# Patient Record
Sex: Male | Born: 1965 | Race: White | Hispanic: No | Marital: Married | Smoking: Never smoker
Health system: Southern US, Community
[De-identification: ages and names within clinical notes are randomized; demographics above are authoritative.]

## PROBLEM LIST (undated history)

## (undated) DIAGNOSIS — J45909 Unspecified asthma, uncomplicated: Secondary | ICD-10-CM

## (undated) DIAGNOSIS — R519 Headache, unspecified: Secondary | ICD-10-CM

## (undated) DIAGNOSIS — I10 Essential (primary) hypertension: Secondary | ICD-10-CM

## (undated) DIAGNOSIS — R51 Headache: Secondary | ICD-10-CM

## (undated) DIAGNOSIS — K5190 Ulcerative colitis, unspecified, without complications: Secondary | ICD-10-CM

## (undated) DIAGNOSIS — N200 Calculus of kidney: Secondary | ICD-10-CM

## (undated) DIAGNOSIS — Z87442 Personal history of urinary calculi: Secondary | ICD-10-CM

## (undated) DIAGNOSIS — N189 Chronic kidney disease, unspecified: Secondary | ICD-10-CM

## (undated) DIAGNOSIS — E669 Obesity, unspecified: Secondary | ICD-10-CM

## (undated) HISTORY — PX: HERNIA REPAIR: SHX51

## (undated) HISTORY — DX: Unspecified asthma, uncomplicated: J45.909

## (undated) HISTORY — PX: COLECTOMY: SHX59

---

## 2012-08-12 ENCOUNTER — Ambulatory Visit: Payer: Self-pay | Admitting: Urology

## 2013-12-16 DIAGNOSIS — I10 Essential (primary) hypertension: Secondary | ICD-10-CM | POA: Insufficient documentation

## 2013-12-16 DIAGNOSIS — E785 Hyperlipidemia, unspecified: Secondary | ICD-10-CM | POA: Insufficient documentation

## 2013-12-16 DIAGNOSIS — J309 Allergic rhinitis, unspecified: Secondary | ICD-10-CM | POA: Insufficient documentation

## 2014-06-16 DIAGNOSIS — D352 Benign neoplasm of pituitary gland: Secondary | ICD-10-CM | POA: Insufficient documentation

## 2014-06-28 ENCOUNTER — Ambulatory Visit: Payer: Self-pay | Admitting: Family Medicine

## 2014-09-16 ENCOUNTER — Ambulatory Visit: Payer: BLUE CROSS/BLUE SHIELD | Admitting: Anesthesiology

## 2014-09-16 ENCOUNTER — Encounter
Admission: RE | Disposition: A | Discharge status: Home / Self Care | Payer: Self-pay | Source: Ambulatory Visit | Attending: Gastroenterology

## 2014-09-16 ENCOUNTER — Encounter: Payer: Self-pay | Admitting: *Deleted

## 2014-09-16 ENCOUNTER — Ambulatory Visit
Admission: RE | Admit: 2014-09-16 | Discharge: 2014-09-16 | Disposition: A | Discharge status: Home / Self Care | Payer: BLUE CROSS/BLUE SHIELD | Source: Ambulatory Visit | Attending: Gastroenterology | Admitting: Gastroenterology

## 2014-09-16 DIAGNOSIS — J45909 Unspecified asthma, uncomplicated: Secondary | ICD-10-CM | POA: Insufficient documentation

## 2014-09-16 DIAGNOSIS — Z8719 Personal history of other diseases of the digestive system: Secondary | ICD-10-CM | POA: Insufficient documentation

## 2014-09-16 DIAGNOSIS — Z1211 Encounter for screening for malignant neoplasm of colon: Secondary | ICD-10-CM | POA: Diagnosis not present

## 2014-09-16 DIAGNOSIS — I129 Hypertensive chronic kidney disease with stage 1 through stage 4 chronic kidney disease, or unspecified chronic kidney disease: Secondary | ICD-10-CM | POA: Insufficient documentation

## 2014-09-16 DIAGNOSIS — Z79899 Other long term (current) drug therapy: Secondary | ICD-10-CM | POA: Diagnosis not present

## 2014-09-16 DIAGNOSIS — K91850 Pouchitis: Secondary | ICD-10-CM | POA: Insufficient documentation

## 2014-09-16 DIAGNOSIS — N189 Chronic kidney disease, unspecified: Secondary | ICD-10-CM | POA: Diagnosis not present

## 2014-09-16 DIAGNOSIS — Z86010 Personal history of colonic polyps: Secondary | ICD-10-CM | POA: Insufficient documentation

## 2014-09-16 DIAGNOSIS — E669 Obesity, unspecified: Secondary | ICD-10-CM | POA: Diagnosis not present

## 2014-09-16 DIAGNOSIS — Z6834 Body mass index (BMI) 34.0-34.9, adult: Secondary | ICD-10-CM | POA: Insufficient documentation

## 2014-09-16 DIAGNOSIS — R51 Headache: Secondary | ICD-10-CM | POA: Diagnosis not present

## 2014-09-16 DIAGNOSIS — Z980 Intestinal bypass and anastomosis status: Secondary | ICD-10-CM | POA: Insufficient documentation

## 2014-09-16 HISTORY — PX: COLONOSCOPY WITH PROPOFOL: SHX5780

## 2014-09-16 HISTORY — DX: Essential (primary) hypertension: I10

## 2014-09-16 HISTORY — DX: Headache, unspecified: R51.9

## 2014-09-16 HISTORY — DX: Obesity, unspecified: E66.9

## 2014-09-16 HISTORY — DX: Calculus of kidney: N20.0

## 2014-09-16 HISTORY — DX: Ulcerative colitis, unspecified, without complications: K51.90

## 2014-09-16 HISTORY — DX: Headache: R51

## 2014-09-16 HISTORY — DX: Chronic kidney disease, unspecified: N18.9

## 2014-09-16 SURGERY — COLONOSCOPY WITH PROPOFOL
Anesthesia: General

## 2014-09-16 MED ORDER — MIDAZOLAM HCL 5 MG/5ML IJ SOLN
INTRAMUSCULAR | Status: DC | PRN
  Administered 2014-09-16: 2 mg via INTRAVENOUS
  Administered 2014-09-16: 1 mg via INTRAVENOUS

## 2014-09-16 MED ORDER — MIDAZOLAM HCL 5 MG/5ML IJ SOLN
INTRAMUSCULAR | Status: AC
  Filled 2014-09-16: qty 5

## 2014-09-16 MED ORDER — FENTANYL CITRATE (PF) 100 MCG/2ML IJ SOLN
INTRAMUSCULAR | Status: DC | PRN
  Administered 2014-09-16: 25 ug via INTRAVENOUS

## 2014-09-16 MED ORDER — SODIUM CHLORIDE 0.9 % IV SOLN
INTRAVENOUS | Status: DC
Start: 2014-09-16 — End: 2014-09-16
  Administered 2014-09-16: 11:00:00 via INTRAVENOUS

## 2014-09-16 MED ORDER — FENTANYL CITRATE (PF) 100 MCG/2ML IJ SOLN
INTRAMUSCULAR | Status: AC
  Filled 2014-09-16: qty 2

## 2014-09-16 NOTE — Op Note (Addendum)
[facility] Gastroenterology Patient Name: Joel Terry Procedure Date: 09/16/2014 12:10 PM MRN: [id number] Account #: [id number] Date of Birth: [birth year] Admit Type: Outpatient Age: 49 Room: [facility] ENDO ROOM 3 Gender: Male Note Status: Supervisor Override Procedure:         Flexible Sigmoidoscopy Indications:       Personal history of colonic polyps, Personal history of                     ulcerative colitis Providers:         Lollie Sails, MD Referring MD:      Caprice Renshaw (Referring MD) Medicines:         Fentanyl 25 micrograms IV, Midazolam 3 mg IV Complications:     No immediate complications. Patient comfortable throughout                     proceedure. Procedure:         Pre-Anesthesia Assessment:                    - ASA Grade Assessment: II - A patient with mild systemic                     disease.                    After obtaining informed consent, the scope was passed                     under direct vision. The Olympus PCF-H180AL colonoscope (                     S#: [id number] ) was introduced through the anus and advanced                     to the the ileo-rectal anastomosis. After obtaining                     informed consent, the scope was passed under direct                     vision. The flexible sigmoidoscopy was accomplished                     without difficulty. The patient tolerated the procedure                     well. The quality of the bowel preparation was good. Findings:      The scope was passed through the rectum into a pouch-like structure. The       top/most proximal portion of the pouch was at about 15 cm from the       residual dentate line. There is an opening at about 10 cm from the       dentate line through which watery stool residue passed. There were       several small ulcers noted distal to the opening with some erythema in       the distal portion of the "pouch". This opening was about 8 -10 mm and   was not passed. Biopsies were also taken from this area as well, and       about 5 cm from the dentate demarcation and placed in a separate jar.       The mucosa of the upper  pouch was smooth. There was no evidence of       polypoid lesion noted throughout.      pouchitis      The digital rectal exam was normal.      The perianal examination was normal. Impression:        ileal pouch noted with stenotic opening to the ileum, mild                     pouchitis.                    - mild pouchitis Recommendation:    - Await pathology results. Procedure Code(s): --- Professional ---                    [phone], Sigmoidoscopy, flexible; diagnostic, including                     collection of specimen(s) by brushing or washing, when                     performed (separate procedure) CPT copyright 2014 American Medical Association. All rights reserved. The codes documented in this report are preliminary and upon coder review may  be revised to meet current compliance requirements. Lollie Sails, MD 09/16/2014 1:36:50 PM This report has been signed electronically. Number of Addenda: 0 Note Initiated On: 09/16/2014 12:10 PM      [facility]

## 2014-09-16 NOTE — Anesthesia Preprocedure Evaluation (Deleted)
Anesthesia Evaluation  Patient identified by MRN, date of birth, ID band Patient awake    Reviewed: Allergy & Precautions, NPO status , Patient's Chart, lab work & pertinent test results  History of Anesthesia Complications Negative for: history of anesthetic complications  Airway Mallampati: II       Dental no notable dental hx.    Pulmonary asthma ,    Pulmonary exam normal       Cardiovascular hypertension, Pt. on medications Normal cardiovascular exam    Neuro/Psych  Headaches, negative psych ROS   GI/Hepatic PUD,   Endo/Other  negative endocrine ROS  Renal/GU Renal disease  negative genitourinary   Musculoskeletal negative musculoskeletal ROS (+)   Abdominal (+) + obese,   Peds negative pediatric ROS (+)  Hematology negative hematology ROS (+)   Anesthesia Other Findings   Reproductive/Obstetrics negative OB ROS                             Anesthesia Physical Anesthesia Plan  ASA: III  Anesthesia Plan: General   Post-op Pain Management:    Induction: Intravenous  Airway Management Planned: Nasal Cannula  Additional Equipment:   Intra-op Plan:   Post-operative Plan:   Informed Consent: I have reviewed the patients History and Physical, chart, labs and discussed the procedure including the risks, benefits and alternatives for the proposed anesthesia with the patient or authorized representative who has indicated his/her understanding and acceptance.     Plan Discussed with: CRNA  Anesthesia Plan Comments:         Anesthesia Quick Evaluation

## 2014-09-16 NOTE — H&P (Signed)
Outpatient short stay form Pre-procedure 09/16/2014 12:10 PM Lollie Sails MD  Primary Physician: Dr. Derinda Late  Reason for visit:  History of ulcerative colitis  History of present illness:  Patient has extensive history of ulcerative colitis. This was diagnosed approximately a year 2000 and has been treated with several agents. Patently not successful with treatment ended having a total proctocolectomy with ileal pouch anastomosis, diverting loop ileostomy with reversal about 6 months later Dr. Hester Mates at [facility]. He does not take any maintenance medication. At times have some mucus in the stool.  Patient states that 4 years ago he had a colonoscopic type procedure being done by Dr. Lulu Riding at [facility] in [place]. He was told at that time that he needed to have a procedure yearly.        Current facility-administered medications:  .  0.9 %  sodium chloride infusion, , Intravenous, Continuous, Lollie Sails, MD, Last Rate: 20 mL/hr at 09/16/14 1126  Prescriptions prior to admission  Medication Sig Dispense Refill Last Dose  . lisinopril (PRINIVIL,ZESTRIL) 20 MG tablet Take 1 tablet by mouth daily.  3   . simvastatin (ZOCOR) 10 MG tablet Take 1 tablet by mouth every evening.  5      Allergies  Allergen Reactions  . Sulfur Rash and Other (See Comments)    Elevated BP and heart rate     Past Medical History  Diagnosis Date  . Hypertension   . Obesity   . Chronic kidney disease   . Nephrolithiasis   . Headache   . Ulcerative colitis     Review of systems:      Physical Exam    Heart and lungs: Regular rate and rhythm without rub or gallop lungs are bilaterally clear    HEENT: Normocephalic atraumatic eyes are anicteric    Other:     Pertinant exam for procedure: Soft nontender nondistended bowel sounds positive normoactive    Planned proceedures: Flexible sigmoidoscopy with conscious sedation. I have discussed the risks benefits  and complications of procedures to include not limited to bleeding, infection, perforation and the risk of sedation and the patient wishes to proceed.    Lollie Sails, MD Gastroenterology 09/16/2014  12:10 PM

## 2014-09-20 LAB — SURGICAL PATHOLOGY

## 2014-09-21 ENCOUNTER — Encounter: Payer: Self-pay | Admitting: Gastroenterology

## 2015-01-13 ENCOUNTER — Ambulatory Visit (INDEPENDENT_AMBULATORY_CARE_PROVIDER_SITE_OTHER): Payer: BLUE CROSS/BLUE SHIELD | Admitting: Physician Assistant

## 2015-01-13 ENCOUNTER — Encounter: Payer: Self-pay | Admitting: Physician Assistant

## 2015-01-13 VITALS — BP 120/80 | HR 110 | Temp 98.0°F | Resp 16 | Ht 68.0 in | Wt 233.0 lb

## 2015-01-13 DIAGNOSIS — K5190 Ulcerative colitis, unspecified, without complications: Secondary | ICD-10-CM | POA: Diagnosis not present

## 2015-01-13 DIAGNOSIS — I10 Essential (primary) hypertension: Secondary | ICD-10-CM | POA: Diagnosis not present

## 2015-01-13 DIAGNOSIS — Z7189 Other specified counseling: Secondary | ICD-10-CM

## 2015-01-13 DIAGNOSIS — Z23 Encounter for immunization: Secondary | ICD-10-CM | POA: Diagnosis not present

## 2015-01-13 DIAGNOSIS — Z7689 Persons encountering health services in other specified circumstances: Secondary | ICD-10-CM

## 2015-01-13 DIAGNOSIS — G43909 Migraine, unspecified, not intractable, without status migrainosus: Secondary | ICD-10-CM | POA: Insufficient documentation

## 2015-01-13 DIAGNOSIS — E785 Hyperlipidemia, unspecified: Secondary | ICD-10-CM

## 2015-01-13 DIAGNOSIS — G43119 Migraine with aura, intractable, without status migrainosus: Secondary | ICD-10-CM | POA: Diagnosis not present

## 2015-01-13 MED ORDER — SIMVASTATIN 10 MG PO TABS: 10.0000 mg | ORAL_TABLET | Freq: Every evening | ORAL | Status: DC

## 2015-01-13 MED ORDER — LISINOPRIL 20 MG PO TABS
20.0000 mg | ORAL_TABLET | Freq: Every day | ORAL | Status: DC
Start: 2015-01-13 — End: 2015-07-14

## 2015-01-13 NOTE — Patient Instructions (Signed)

## 2015-01-13 NOTE — Progress Notes (Signed)
Patient: Joel Terry, Male    DOB: 05/06/1965, 49 y.o.   MRN: [id number] Visit Date: 01/13/2015  Today's Joel Terry: Joel Daring, PA-C   Chief Complaint  Patient presents with  . Establish Care   Subjective:    Annual physical exam Joel Terry is a 49 y.o. male who presents today for health maintenance and complete physical. He feels fairly well. He reports exercising, walks 2 miles everyday. He reports he is sleeping poorly. He does have a history of ulcerative colitis and underwent and ileectomy in 2000-06-21 for this. He most recently saw Dr. Gustavo Terry in June 2016 with follow-up colonoscopy which was normal. There was some acute inflammation in the biopsies taken and he was placed on an antibiotic for that. He has done well since his surgery and his ulcerative colitis has been controlled since that. He also does have history of migraines. He states that they are not debilitating. He occasionally will have an aura with his headaches. Most often he can control his headaches with rest and Tylenol. He also has high blood pressure and is currently well controlled on lisinopril. He also has a history of high cholesterol and states his cholesterol levels last time were stable and controlled on simvastatin. He is due to have his labs checked with a biometric screening at his employer in 2 weeks. He also states that he has a pituitary adenoma that has been followed for many years with MRI. He stated that he would go once every 3 or 4 years to have the size of the adenoma checked. His most recent MRI was either at the end of 06/21/13 or early 06-21-2014. He was not told the actual MRI results but was told that he did not have to have another MRI. This MRI was done at [facility]. We will try to get that result.  He does have a strong positive family history for cardiovascular disease in his father, brothers, uncles and grandfather. His father did have cardiovascular disease and underwent a bypass surgery in  the 57s. He passed away in 2004/06/21 due to cardiovascular complications. His mother also had ulcerative colitis. She passed away from a blood clot that occurred following a hernia repair surgery. He does have an older brother and an older sister that both seem to be fairly healthy and without complication.     Review of Systems  HENT: Positive for rhinorrhea and sinus pressure.   Musculoskeletal: Positive for neck stiffness.  Neurological: Positive for headaches.  All other systems reviewed and are negative.   Social History He  reports that he has never smoked. He does not have any smokeless tobacco history on file. He reports that he drinks alcohol. He reports that he does not use illicit drugs. Social History   Social History  . Marital Status: Married    Spouse Name: N/A  . Number of Children: N/A  . Years of Education: N/A   Social History Main Topics  . Smoking status: Never Smoker   . Smokeless tobacco: None  . Alcohol Use: Yes     Comment: Occassional  . Drug Use: No  . Sexual Activity: Not Asked   Other Topics Concern  . None   Social History Narrative    Patient Active Problem List   Diagnosis Date Noted  . Adenoma of pituitary 06/16/2014  . Allergic rhinitis 12/16/2013  . Benign essential HTN 12/16/2013  . HLD (hyperlipidemia) 12/16/2013    Past Surgical History  Procedure Laterality Date  . Colectomy    . Hernia repair    . Colonoscopy with propofol N/A 09/16/2014    Procedure: COLONOSCOPY WITH PROPOFOL;  Surgeon: Joel Sails, MD;  Location: [facility] ENDOSCOPY;  Service: Endoscopy;  Laterality: N/A;    Family History  Family Status  Relation Status Death Age  . Mother Deceased   . Father Deceased   . Son Alive    His family history is not on file.    Allergies  Allergen Reactions  . Sulfur Rash and Other (See Comments)    Elevated BP and heart rate    Previous Medications   LISINOPRIL (PRINIVIL,ZESTRIL) 20 MG TABLET    Take 1 tablet by mouth  daily.   SIMVASTATIN (ZOCOR) 10 MG TABLET    Take 1 tablet by mouth every evening.    Patient Care Team: Joel Daring, PA-C as PCP - General (Physician Assistant)     Objective:   Vitals: BP 120/80 mmHg  Pulse 110  Temp(Src) 98 F (36.7 C) (Oral)  Resp 16  Ht 5' 8"  (1.727 m)  Wt 233 lb (105.688 kg)  BMI 35.44 kg/m2   Physical Exam  Constitutional: He appears well-developed and well-nourished. No distress.  Cardiovascular: Normal rate, regular rhythm and normal heart sounds.  Exam reveals no gallop and no friction rub.   No murmur heard. Pulmonary/Chest: Effort normal and breath sounds normal. No respiratory distress. He has no wheezes. He has no rales.  Neurological: He is alert.  Skin: He is not diaphoretic.  Psychiatric: He has a normal mood and affect. His behavior is normal. Judgment and thought content normal.  Vitals reviewed.    Depression Screen No flowsheet data found.    Assessment & Plan:     Routine Health Maintenance and Physical Exam   1. Establishing care with new doctor, encounter for He gets labs done in 2 weeks with biometric screening for his employment. He will bring these results are faxed the nerve to the office. I will call him with any recommendations I have once I see these results. I will have him follow-up in 6 months for his annual physical exam.  2. Need for influenza vaccination Flu vaccine given today. Tolerated well and no complications. - Flu Vaccine QUAD 36+ mos IM  3. Intractable migraine with aura without status migrainosus Stable. He states they're not debilitating. He is able to control them with rest and Tylenol when he feels them coming on.  4. Ulcerative colitis, without complications Status post ileectomy. Most recent colonoscopy done by Dr. Gustavo Terry in June 2016. There was some acute inflammation noted in the biopsies taken. Dr. Gustavo Terry placed him on antibiotic for this. He is to follow-up with Dr. Gustavo Terry in  November of this year.  5. Essential hypertension Stable and currently well controlled on lisinopril. Labs will be checked in 2 weeks. Prescription for lisinopril was printed today for him to mail into his new insurance company prefers a 90 day supply. We will follow-up pending lab results or in 6 months if labs are stable. - lisinopril (PRINIVIL,ZESTRIL) 20 MG tablet; Take 1 tablet (20 mg total) by mouth daily.  Dispense: 90 tablet; Refill: 3  6. Hyperlipidemia Reported to be stable on simvastatin. Labs will be checked in 2 weeks. Prescription for simvastatin was printed today for him to mail into his new insurance company as they prefer a 90 day supply. We'll follow-up pending lab results or in 6 months at annual physical if  labs are stable. - simvastatin (ZOCOR) 10 MG tablet; Take 1 tablet (10 mg total) by mouth every evening.  Dispense: 90 tablet; Refill: 3  Exercise Activities and Dietary recommendations Goals    None       There is no immunization history on file for this patient.  Health Maintenance  Topic Date Due  . HIV Screening  11/05/1982  . TETANUS/TDAP  11/05/1986  . INFLUENZA VACCINE  11/14/2014      Discussed health benefits of physical activity, and encouraged him to engage in regular exercise appropriate for his age and condition.    --------------------------------------------------------------------

## 2015-04-26 DIAGNOSIS — N529 Male erectile dysfunction, unspecified: Secondary | ICD-10-CM | POA: Insufficient documentation

## 2015-04-26 DIAGNOSIS — N401 Enlarged prostate with lower urinary tract symptoms: Secondary | ICD-10-CM

## 2015-04-26 DIAGNOSIS — Z87442 Personal history of urinary calculi: Secondary | ICD-10-CM | POA: Insufficient documentation

## 2015-04-26 DIAGNOSIS — N138 Other obstructive and reflux uropathy: Secondary | ICD-10-CM | POA: Insufficient documentation

## 2015-07-14 ENCOUNTER — Encounter: Payer: Self-pay | Admitting: Physician Assistant

## 2015-07-14 ENCOUNTER — Ambulatory Visit (INDEPENDENT_AMBULATORY_CARE_PROVIDER_SITE_OTHER): Payer: BLUE CROSS/BLUE SHIELD | Admitting: Physician Assistant

## 2015-07-14 VITALS — BP 130/80 | HR 79 | Temp 98.0°F | Resp 16 | Ht 68.0 in | Wt 232.2 lb

## 2015-07-14 DIAGNOSIS — G43109 Migraine with aura, not intractable, without status migrainosus: Secondary | ICD-10-CM | POA: Diagnosis not present

## 2015-07-14 DIAGNOSIS — E785 Hyperlipidemia, unspecified: Secondary | ICD-10-CM | POA: Diagnosis not present

## 2015-07-14 DIAGNOSIS — I10 Essential (primary) hypertension: Secondary | ICD-10-CM | POA: Diagnosis not present

## 2015-07-14 DIAGNOSIS — Z0000 Encounter for general adult medical examination without abnormal findings: Secondary | ICD-10-CM

## 2015-07-14 DIAGNOSIS — Z833 Family history of diabetes mellitus: Secondary | ICD-10-CM

## 2015-07-14 DIAGNOSIS — Z125 Encounter for screening for malignant neoplasm of prostate: Secondary | ICD-10-CM

## 2015-07-14 MED ORDER — SIMVASTATIN 10 MG PO TABS: 10.0000 mg | ORAL_TABLET | Freq: Every evening | ORAL | Status: DC

## 2015-07-14 MED ORDER — SUMATRIPTAN SUCCINATE 100 MG PO TABS: 100.0000 mg | ORAL_TABLET | ORAL | Status: DC | PRN

## 2015-07-14 MED ORDER — LISINOPRIL 20 MG PO TABS: 20.0000 mg | ORAL_TABLET | Freq: Every day | ORAL | Status: DC

## 2015-07-14 NOTE — Progress Notes (Signed)
Patient: Joel Terry, Male    DOB: 01-17-1966, 50 y.o.   MRN: [id number] Visit Date: 07/14/2015  Today's Provider: Mar Daring, PA-C   Chief Complaint  Patient presents with  . Annual Exam   Subjective:    Annual physical exam Joel Terry is a 50 y.o. male who presents today for health maintenance and complete physical. He feels well. He reports exercising-Walks daily at least 3-4 miles. Keeps himself very busy. He reports he is sleeping Fairly well.  He does also have ulcerative colitis and is followed by Dr. Gustavo Lah. He had his rectal exam, colonoscopy and endoscopy in June 2016. His colonoscopy was fairly normal with the exception of mild pouchitis at his anastomosis site. Otherwise he is doing fairly well and states that he does not have many issues with pouchitis.  He does report that approximately 2 months ago he was hospitalized secondary to small bowel obstruction. He states they placed an NG tube and made him nothing by mouth and the SBO resolved. Had no complications since. -----------------------------------------------------------------   Review of Systems  Constitutional: Negative.   Eyes: Negative.   Respiratory: Negative.   Cardiovascular: Negative.   Gastrointestinal: Negative.   Endocrine: Negative.   Genitourinary: Negative.   Musculoskeletal: Negative.   Skin: Negative.   Allergic/Immunologic: Negative.   Neurological: Positive for headaches (Migraines;rare approx 1 per month).  Hematological: Negative.   Psychiatric/Behavioral: Negative.     Social History      He  reports that he has never smoked. He does not have any smokeless tobacco history on file. He reports that he drinks alcohol. He reports that he does not use illicit drugs.       Social History   Social History  . Marital Status: Married    Spouse Name: N/A  . Number of Children: N/A  . Years of Education: N/A   Social History Main Topics  . Smoking status: Never  Smoker   . Smokeless tobacco: None  . Alcohol Use: Yes     Comment: Occassional  . Drug Use: No  . Sexual Activity: Not Asked   Other Topics Concern  . None   Social History Narrative    Past Medical History  Diagnosis Date  . Hypertension   . Obesity   . Chronic kidney disease   . Nephrolithiasis   . Headache   . Ulcerative colitis ([place])   . Asthma      Patient Active Problem List   Diagnosis Date Noted  . 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  . H/O renal calculi 04/26/2015  . Benign prostatic hyperplasia with urinary obstruction 04/26/2015  . Migraines 01/13/2015  . Ulcerative colitis ([place]) 01/13/2015  . Adenoma of pituitary ([place]) 06/16/2014  . Allergic rhinitis 12/16/2013  . Benign essential HTN 12/16/2013  . HLD (hyperlipidemia) 12/16/2013    Past Surgical History  Procedure Laterality Date  . Colectomy    . Hernia repair    . Colonoscopy with propofol N/A 09/16/2014    Procedure: COLONOSCOPY WITH PROPOFOL;  Surgeon: Lollie Sails, MD;  Location: [facility] ENDOSCOPY;  Service: Endoscopy;  Laterality: N/A;    Family History        Family Status  Relation Status Death Age  . Mother Deceased 69    blood clot  . Father Deceased 61  . Son Alive         His family history is not on file.    Allergies  Allergen Reactions  . Sulfur Rash and Other (See Comments)    Elevated BP and heart rate    Previous Medications   LISINOPRIL (PRINIVIL,ZESTRIL) 20 MG TABLET    Take 1 tablet (20 mg total) by mouth daily.   SIMVASTATIN (ZOCOR) 10 MG TABLET    Take 1 tablet (10 mg total) by mouth every evening.    Patient Care Team: Mar Daring, PA-C as PCP - General (Physician Assistant)     Objective:   Vitals: BP 130/80 mmHg  Pulse 79  Temp(Src) 98 F (36.7 C) (Oral)  Resp 16  Ht 5' 8"  (1.727 m)  Wt 232 lb 3.2 oz (105.325 kg)  BMI 35.31 kg/m2   Physical Exam  Constitutional: He is oriented to person, place, and time. He appears  well-developed and well-nourished.  HENT:  Head: Normocephalic and atraumatic.  Right Ear: External ear normal.  Left Ear: External ear normal.  Nose: Nose normal.  Mouth/Throat: Oropharynx is clear and moist.  Eyes: Conjunctivae and EOM are normal. Pupils are equal, round, and reactive to light. Right eye exhibits no discharge.  Neck: Normal range of motion. Neck supple. No tracheal deviation present. No thyromegaly present.  Cardiovascular: Normal rate, regular rhythm, normal heart sounds and intact distal pulses.   No murmur heard. Pulmonary/Chest: Effort normal and breath sounds normal. No respiratory distress. He has no wheezes. He has no rales. He exhibits no tenderness.  Abdominal: Soft. He exhibits no distension and no mass. There is no tenderness. There is no rebound and no guarding.  Genitourinary:  Deferred to Dr. Richarda Overlie recently done June 2016.  Musculoskeletal: Normal range of motion. He exhibits no edema or tenderness.  Lymphadenopathy:    He has no cervical adenopathy.  Neurological: He is alert and oriented to person, place, and time. He has normal reflexes. No cranial nerve deficit. He exhibits normal muscle tone. Coordination normal.  Skin: Skin is warm and dry. No rash noted. No erythema.  Psychiatric: He has a normal mood and affect. His behavior is normal. Judgment and thought content normal.     Depression Screen No flowsheet data found.    Assessment & Plan:     Routine Health Maintenance and Physical Exam  1. Annual physical exam Normal physical exam today. I will check labs as below and follow-up with him pending these lab results. If labs are stable and within normal limits he will not need to have them repeated for one year until his next annual physical exam. He is to call the office in the meantime if he has any acute issues, questions or concerns. - CBC with Differential/Platelet - Comprehensive metabolic panel - TSH  2. HLD  (hyperlipidemia) I will check cholesterol panel as below. Simvastatin 10 mg was refilled as below and [facility]. I will follow-up with him pending lab results. - Lipid panel - simvastatin (ZOCOR) 10 MG tablet; Take 1 tablet (10 mg total) by mouth every evening.  Dispense: 90 tablet; Refill: 3  3. Benign essential HTN Currently stable on lisinopril 20 mg. I will check labs as below and follow-up pending lab results. Lisinopril was refilled as below. - Comprehensive metabolic panel - lisinopril (PRINIVIL,ZESTRIL) 20 MG tablet; Take 1 tablet (20 mg total) by mouth daily.  Dispense: 90 tablet; Refill: 3  4. Migraine with aura and without status migrainosus, not intractable Stable. Uses Imitrex as needed for migraine relief. States that he normally starts with Excedrin Migraine or ibuprofen and if he does  not get any relief with that he will then take the sumatriptan. Sumatriptan was refilled as below. -SUMAtriptan (IMITREX) 100 MG tablet; Take 1 tablet (100 mg total) by mouth every 2 (two) hours as needed for migraine. May repeat in 2 hours if headache persists or recurs.  Dispense: 15 tablet; Refill: 3  5. Family history of diabetes mellitus (DM) Family history of diabetes. I will check hemoglobin A1c and follow-up pending results. - Hemoglobin A1c  8. Encounter for prostate cancer screening He does have his digital rectal exam done by Dr. Gustavo Lah. Most recently was done in June 2016. I will check PSA level as below. I will follow-up with him pending these results. - PSA   Exercise Activities and Dietary recommendations Goals    . Exercise 150 minutes per week (moderate activity)       Immunization History  Administered Date(s) Administered  . Influenza,inj,Quad PF,36+ Mos 01/13/2015    Health Maintenance  Topic Date Due  . HIV Screening  11/05/1982  . TETANUS/TDAP  11/05/1986  . INFLUENZA VACCINE  11/14/2015      Discussed health benefits of physical activity,  and encouraged him to engage in regular exercise appropriate for his age and condition.    --------------------------------------------------------------------

## 2015-07-14 NOTE — Patient Instructions (Signed)

## 2015-07-15 LAB — CBC WITH DIFFERENTIAL/PLATELET
Basophils Absolute: 0 10*3/uL (ref 0.0–0.2)
Basos: 1 %
EOS (ABSOLUTE): 0.1 10*3/uL (ref 0.0–0.4)
Eos: 1 %
Hematocrit: 45.3 % (ref 37.5–51.0)
Hemoglobin: 15.6 g/dL (ref 12.6–17.7)
IMMATURE GRANULOCYTES: 0 %
Immature Grans (Abs): 0 10*3/uL (ref 0.0–0.1)
Lymphocytes Absolute: 1.5 10*3/uL (ref 0.7–3.1)
Lymphs: 24 %
MCH: 31.1 pg (ref 26.6–33.0)
MCHC: 34.4 g/dL (ref 31.5–35.7)
MCV: 90 fL (ref 79–97)
MONOS ABS: 0.5 10*3/uL (ref 0.1–0.9)
Monocytes: 8 %
Neutrophils Absolute: 4 10*3/uL (ref 1.4–7.0)
Neutrophils: 66 %
PLATELETS: 442 10*3/uL — AB (ref 150–379)
RBC: 5.02 x10E6/uL (ref 4.14–5.80)
RDW: 13.7 % (ref 12.3–15.4)
WBC: 6.1 10*3/uL (ref 3.4–10.8)

## 2015-07-15 LAB — COMPREHENSIVE METABOLIC PANEL
ALK PHOS: 76 IU/L (ref 39–117)
ALT: 17 IU/L (ref 0–44)
AST: 18 IU/L (ref 0–40)
Albumin/Globulin Ratio: 1.5 (ref 1.2–2.2)
Albumin: 4 g/dL (ref 3.5–5.5)
BUN/Creatinine Ratio: 13 (ref 9–20)
BUN: 14 mg/dL (ref 6–24)
Bilirubin Total: 0.6 mg/dL (ref 0.0–1.2)
CHLORIDE: 103 mmol/L (ref 96–106)
CO2: 24 mmol/L (ref 18–29)
Calcium: 9.7 mg/dL (ref 8.7–10.2)
Creatinine, Ser: 1.11 mg/dL (ref 0.76–1.27)
GFR calc Af Amer: 91 mL/min/{1.73_m2} (ref >59)
GFR calc non Af Amer: 79 mL/min/{1.73_m2} (ref >59)
Globulin, Total: 2.6 g/dL (ref 1.5–4.5)
Glucose: 90 mg/dL (ref 65–99)
Potassium: 5.1 mmol/L (ref 3.5–5.2)
Sodium: 141 mmol/L (ref 134–144)
Total Protein: 6.6 g/dL (ref 6.0–8.5)

## 2015-07-15 LAB — LIPID PANEL
CHOLESTEROL TOTAL: 204 mg/dL — AB (ref 100–199)
Chol/HDL Ratio: 6.4 ratio units — ABNORMAL HIGH (ref 0.0–5.0)
HDL: 32 mg/dL — ABNORMAL LOW (ref >39)
LDL CALC: 149 mg/dL — AB (ref 0–99)
Triglycerides: 117 mg/dL (ref 0–149)
VLDL CHOLESTEROL CAL: 23 mg/dL (ref 5–40)

## 2015-07-15 LAB — HEMOGLOBIN A1C
Est. average glucose Bld gHb Est-mCnc: 114 mg/dL
Hgb A1c MFr Bld: 5.6 % (ref 4.8–5.6)

## 2015-07-15 LAB — PSA: PROSTATE SPECIFIC AG, SERUM: 0.8 ng/mL (ref 0.0–4.0)

## 2015-07-15 LAB — TSH: TSH: 0.819 u[IU]/mL (ref 0.450–4.500)

## 2015-07-17 ENCOUNTER — Telehealth: Payer: Self-pay

## 2015-07-17 MED ORDER — SIMVASTATIN 20 MG PO TABS: 20.0000 mg | ORAL_TABLET | Freq: Every day | ORAL | Status: DC

## 2015-07-17 NOTE — Addendum Note (Signed)
Addended by: Mar Daring on: 07/17/2015 10:19 AM   Modules accepted: Orders, Medications, SmartSet

## 2015-07-17 NOTE — Telephone Encounter (Signed)
-----   Message from Mar Daring, [place] sent at 07/17/2015 10:18 AM EDT ----- All labs are within normal limits and stable with exception of cholesterol. Total cholesterol is 204 (like below 200) and LDL is elevated at 149 (would like below 100). You would benefit from increasing your simvastatin to 79m if tolerated. I will send in new Rx and we can recheck labs in one year. Thanks! -JB

## 2015-07-17 NOTE — Telephone Encounter (Signed)
LMTCB

## 2015-07-18 NOTE — Telephone Encounter (Signed)
Patient advised as directed below. Voiced understanding.  Thanks,  -Takayla Baillie

## 2015-09-11 DIAGNOSIS — R000 Tachycardia, unspecified: Secondary | ICD-10-CM | POA: Diagnosis not present

## 2015-09-11 DIAGNOSIS — J0100 Acute maxillary sinusitis, unspecified: Secondary | ICD-10-CM | POA: Diagnosis not present

## 2015-09-11 DIAGNOSIS — R0982 Postnasal drip: Secondary | ICD-10-CM | POA: Diagnosis not present

## 2015-10-02 ENCOUNTER — Encounter: Payer: Self-pay | Admitting: Physician Assistant

## 2015-10-02 ENCOUNTER — Ambulatory Visit (INDEPENDENT_AMBULATORY_CARE_PROVIDER_SITE_OTHER): Payer: BLUE CROSS/BLUE SHIELD | Admitting: Physician Assistant

## 2015-10-02 VITALS — BP 140/90 | HR 101 | Temp 98.8°F | Resp 16 | Wt 227.4 lb

## 2015-10-02 DIAGNOSIS — R05 Cough: Secondary | ICD-10-CM

## 2015-10-02 DIAGNOSIS — J0100 Acute maxillary sinusitis, unspecified: Secondary | ICD-10-CM | POA: Diagnosis not present

## 2015-10-02 DIAGNOSIS — R059 Cough, unspecified: Secondary | ICD-10-CM

## 2015-10-02 MED ORDER — HYDROCODONE-HOMATROPINE 5-1.5 MG/5ML PO SYRP: 5.0000 mL | ORAL_SOLUTION | Freq: Three times a day (TID) | ORAL | Status: DC | PRN

## 2015-10-02 MED ORDER — DOXYCYCLINE HYCLATE 100 MG PO TABS: 100.0000 mg | ORAL_TABLET | Freq: Two times a day (BID) | ORAL | Status: DC

## 2015-10-02 NOTE — Progress Notes (Signed)
Patient: Joel Terry Male    DOB: 12-15-1965   50 y.o.   MRN: [id number] Visit Date: 10/02/2015  Today's Provider: Mar Daring, PA-C   Chief Complaint  Patient presents with  . Cough   Subjective:    Cough This is a new problem. The current episode started 1 to 4 weeks ago. The problem has been unchanged. The problem occurs constantly. The cough is non-productive. Associated symptoms include nasal congestion, postnasal drip, a sore throat and shortness of breath. Pertinent negatives include no chest pain, chills, ear pain, fever, headaches, rhinorrhea or wheezing. The symptoms are aggravated by lying down. He has tried OTC cough suppressant (Benzonatate from the [facility] that he was prescribed a month ago for Sinus infection.) for the symptoms.   He states that symptoms began over one month ago. He was seen at [facility] and given flonase, tessalon perles and amoxil 871m. He states his symptoms did improve with treatment but cough persisted. Now all symptoms have returned over the last week.    Allergies  Allergen Reactions  . Sulfur Rash and Other (See Comments)    Elevated BP and heart rate   Current Meds  Medication Sig  . lisinopril (PRINIVIL,ZESTRIL) 20 MG tablet Take 1 tablet (20 mg total) by mouth daily.  . simvastatin (ZOCOR) 20 MG tablet Take 1 tablet (20 mg total) by mouth at bedtime.  . SUMAtriptan (IMITREX) 100 MG tablet Take 1 tablet (100 mg total) by mouth every 2 (two) hours as needed for migraine. May repeat in 2 hours if headache persists or recurs.    Review of Systems  Constitutional: Negative for fever and chills.  HENT: Positive for congestion (chest congestion. Feels that is hard to take a deep breath), postnasal drip, sinus pressure (some), sore throat and voice change (a little hoarse). Negative for ear pain, rhinorrhea and sneezing.   Respiratory: Positive for cough and shortness of breath. Negative for chest tightness and wheezing.     Cardiovascular: Negative for chest pain, palpitations and leg swelling.  Gastrointestinal: Negative for nausea, vomiting and abdominal pain.  Neurological: Negative for dizziness, light-headedness and headaches.    Social History  Substance Use Topics  . Smoking status: Never Smoker   . Smokeless tobacco: Not on file  . Alcohol Use: Yes     Comment: Occassional   Objective:   BP 140/90 mmHg  Pulse 101  Temp(Src) 98.8 F (37.1 C) (Oral)  Resp 16  Wt 227 lb 6.4 oz (103.148 kg)  SpO2 97%  Physical Exam  Constitutional: He appears well-developed and well-nourished. No distress.  HENT:  Head: Normocephalic and atraumatic.  Right Ear: Hearing, tympanic membrane, external ear and ear canal normal. Tympanic membrane is not erythematous and not bulging. No middle ear effusion.  Left Ear: Hearing, tympanic membrane, external ear and ear canal normal. Tympanic membrane is not erythematous and not bulging.  No middle ear effusion.  Nose: Mucosal edema and rhinorrhea present. Right sinus exhibits maxillary sinus tenderness. Right sinus exhibits no frontal sinus tenderness. Left sinus exhibits maxillary sinus tenderness. Left sinus exhibits no frontal sinus tenderness.  Mouth/Throat: Uvula is midline, oropharynx is clear and moist and mucous membranes are normal. No oropharyngeal exudate, posterior oropharyngeal edema or posterior oropharyngeal erythema.  Eyes: Conjunctivae and EOM are normal. Pupils are equal, round, and reactive to light. Right eye exhibits no discharge. Left eye exhibits no discharge.  Neck: Normal range of motion. Neck supple. No tracheal deviation present.  No Brudzinski's sign and no Kernig's sign noted. No thyromegaly present.  Cardiovascular: Normal rate, regular rhythm and normal heart sounds.  Exam reveals no gallop and no friction rub.   No murmur heard. Pulmonary/Chest: Effort normal and breath sounds normal. No stridor. No respiratory distress. He has no wheezes. He  has no rales.  Lymphadenopathy:    He has no cervical adenopathy.  Skin: Skin is warm and dry. He is not diaphoretic.  Vitals reviewed.       Assessment & Plan:     1. Acute maxillary sinusitis, recurrence not specified Worsening symptoms that have not responded to OTC treatments . Will treat with doxycycline and hycodan for cough syrup. Advised of drowsiness precautions with hycodan. Delsym for daytime cough. He is to call if symptoms do not improve and may consider adding prednisone.  - doxycycline (VIBRA-TABS) 100 MG tablet; Take 1 tablet (100 mg total) by mouth 2 (two) times daily.  Dispense: 20 tablet; Refill: 0  2. Cough See above medical treatment plan. - HYDROcodone-homatropine (HYCODAN) 5-1.5 MG/5ML syrup; Take 5 mLs by mouth every 8 (eight) hours as needed.  Dispense: 180 mL; Refill: 0       Mar Daring, PA-C  [facility]

## 2015-10-02 NOTE — Patient Instructions (Signed)

## 2015-12-01 DIAGNOSIS — R339 Retention of urine, unspecified: Secondary | ICD-10-CM | POA: Diagnosis not present

## 2015-12-01 DIAGNOSIS — N138 Other obstructive and reflux uropathy: Secondary | ICD-10-CM | POA: Diagnosis not present

## 2015-12-01 DIAGNOSIS — Z87442 Personal history of urinary calculi: Secondary | ICD-10-CM | POA: Diagnosis not present

## 2015-12-01 DIAGNOSIS — N401 Enlarged prostate with lower urinary tract symptoms: Secondary | ICD-10-CM | POA: Diagnosis not present

## 2015-12-01 DIAGNOSIS — Z8639 Personal history of other endocrine, nutritional and metabolic disease: Secondary | ICD-10-CM | POA: Diagnosis not present

## 2015-12-01 DIAGNOSIS — N529 Male erectile dysfunction, unspecified: Secondary | ICD-10-CM | POA: Diagnosis not present

## 2016-02-21 ENCOUNTER — Ambulatory Visit (INDEPENDENT_AMBULATORY_CARE_PROVIDER_SITE_OTHER): Payer: BLUE CROSS/BLUE SHIELD | Admitting: Physician Assistant

## 2016-02-21 ENCOUNTER — Encounter: Payer: Self-pay | Admitting: Physician Assistant

## 2016-02-21 VITALS — BP 150/88 | HR 90 | Temp 98.0°F | Resp 16 | Wt 207.6 lb

## 2016-02-21 DIAGNOSIS — F419 Anxiety disorder, unspecified: Secondary | ICD-10-CM | POA: Diagnosis not present

## 2016-02-21 MED ORDER — BUSPIRONE HCL 15 MG PO TABS: 15.0000 mg | ORAL_TABLET | Freq: Three times a day (TID) | ORAL | 0 refills | Status: DC

## 2016-02-21 NOTE — Progress Notes (Signed)
Patient: Joel Terry Male    DOB: October 01, 1965   50 y.o.   MRN: [id number] Visit Date: 02/21/2016  Today's Provider: Mar Daring, PA-C   Chief Complaint  Patient presents with  . Anxiety   Subjective:    Anxiety  Presents for initial visit. Onset was 1 to 4 weeks ago (more worst in the past 2 weeks/daily). The problem has been gradually worsening. Symptoms include decreased concentration, excessive worry, hyperventilation (a little), insomnia (moderatety well depends the days), nervous/anxious behavior, palpitations, panic, restlessness and shortness of breath. Patient reports no depressed mood, feeling of choking or suicidal ideas. Primary symptoms comment: general weakness in legs. Symptoms occur constantly. Duration: it started in past weeks and lasted for 30 minutes to and hour in half. Now in the past 2 weeks is the all day. The severity of symptoms is interfering with daily activities, causing significant distress and moderate. The quality of sleep is fair.   Risk factors include family history (work/family stressed). Compliance with prior treatments: Patient reports that the last Rx he was prescribed (Buspirone) was in 2011 and it helped some. He reports that he experienced this when he was in college and was given medication.   Patient declined Influenza vaccine.     Allergies  Allergen Reactions  . Sulfa Antibiotics Hives    Increased HR and BP  . Sulfur Rash and Other (See Comments)    Elevated BP and heart rate     Current Outpatient Prescriptions:  .  lisinopril (PRINIVIL,ZESTRIL) 20 MG tablet, Take 1 tablet (20 mg total) by mouth daily., Disp: 90 tablet, Rfl: 3 .  simvastatin (ZOCOR) 20 MG tablet, Take 1 tablet (20 mg total) by mouth at bedtime., Disp: 90 tablet, Rfl: 3 .  SUMAtriptan (IMITREX) 100 MG tablet, Take 1 tablet (100 mg total) by mouth every 2 (two) hours as needed for migraine. May repeat in 2 hours if headache persists or recurs., Disp: 15  tablet, Rfl: 3 .  doxycycline (VIBRA-TABS) 100 MG tablet, Take 1 tablet (100 mg total) by mouth 2 (two) times daily. (Patient not taking: Reported on 02/21/2016), Disp: 20 tablet, Rfl: 0 .  HYDROcodone-homatropine (HYCODAN) 5-1.5 MG/5ML syrup, Take 5 mLs by mouth every 8 (eight) hours as needed. (Patient not taking: Reported on 02/21/2016), Disp: 180 mL, Rfl: 0  Review of Systems  Constitutional: Negative.   Respiratory: Positive for shortness of breath.   Cardiovascular: Positive for palpitations.  Gastrointestinal: Negative.   Psychiatric/Behavioral: Positive for decreased concentration. Negative for agitation, dysphoric mood, self-injury, sleep disturbance and suicidal ideas. The patient is nervous/anxious and has insomnia (moderatety well depends the days).     Social History  Substance Use Topics  . Smoking status: Never Smoker  . Smokeless tobacco: Never Used  . Alcohol use Yes     Comment: Occassional   Objective:   BP (!) 150/88 (BP Location: Right Arm, Patient Position: Sitting, Cuff Size: Normal)   Pulse 90   Temp 98 F (36.7 C) (Oral)   Resp 16   Wt 207 lb 9.6 oz (94.2 kg)   BMI 31.57 kg/m   Physical Exam  Constitutional: He appears well-developed and well-nourished. No distress.  HENT:  Head: Normocephalic and atraumatic.  Neck: Normal range of motion. Neck supple.  Cardiovascular: Normal rate, regular rhythm and normal heart sounds.  Exam reveals no gallop and no friction rub.   No murmur heard. Pulmonary/Chest: Effort normal and breath sounds normal. No respiratory distress.  He has no wheezes. He has no rales.  Skin: He is not diaphoretic.  Psychiatric: His speech is normal and behavior is normal. Judgment and thought content normal. His mood appears anxious. Cognition and memory are normal. He does not exhibit a depressed mood.  Vitals reviewed.     Assessment & Plan:     1. Acute anxiety Worsening symptoms. Will add buspar as below since he has tolerated  this well. I will see him back in 2-4 weeks for re-evaluation.  - busPIRone (BUSPAR) 15 MG tablet; Take 1 tablet (15 mg total) by mouth 3 (three) times daily.  Dispense: 90 tablet; Refill: 0       Mar Daring, PA-C  [facility]

## 2016-02-21 NOTE — Patient Instructions (Signed)
Buspirone tablets What is this medicine? BUSPIRONE (byoo SPYE rone) is used to treat anxiety disorders. This medicine may be used for other purposes; ask your health care provider or pharmacist if you have questions. What should I tell my health care provider before I take this medicine? They need to know if you have any of these conditions: -kidney or liver disease -an unusual or allergic reaction to buspirone, other medicines, foods, dyes, or preservatives -pregnant or trying to get pregnant -breast-feeding How should I use this medicine? Take this medicine by mouth with a glass of water. Follow the directions on the prescription label. You may take this medicine with or without food. To ensure that this medicine always works the same way for you, you should take it either always with or always without food. Take your doses at regular intervals. Do not take your medicine more often than directed. Do not stop taking except on the advice of your doctor or health care professional. Talk to your pediatrician regarding the use of this medicine in children. Special care may be needed. Overdosage: If you think you have taken too much of this medicine contact a poison control center or emergency room at once. NOTE: This medicine is only for you. Do not share this medicine with others. What if I miss a dose? If you miss a dose, take it as soon as you can. If it is almost time for your next dose, take only that dose. Do not take double or extra doses. What may interact with this medicine? Do not take this medicine with any of the following medications: -linezolid -MAOIs like Carbex, Eldepryl, Marplan, Nardil, and Parnate -methylene blue -procarbazine This medicine may also interact with the following medications: -diazepam -digoxin -diltiazem -erythromycin -grapefruit juice -haloperidol -medicines for mental depression or mood problems -medicines for seizures like carbamazepine, phenobarbital  and phenytoin -nefazodone -other medications for anxiety -rifampin -ritonavir -some antifungal medicines like itraconazole, ketoconazole, and voriconazole -verapamil -warfarin This list may not describe all possible interactions. Give your health care provider a list of all the medicines, herbs, non-prescription drugs, or dietary supplements you use. Also tell them if you smoke, drink alcohol, or use illegal drugs. Some items may interact with your medicine. What should I watch for while using this medicine? Visit your doctor or health care professional for regular checks on your progress. It may take 1 to 2 weeks before your anxiety gets better. You may get drowsy or dizzy. Do not drive, use machinery, or do anything that needs mental alertness until you know how this drug affects you. Do not stand or sit up quickly, especially if you are an older patient. This reduces the risk of dizzy or fainting spells. Alcohol can make you more drowsy and dizzy. Avoid alcoholic drinks. What side effects may I notice from receiving this medicine? Side effects that you should report to your doctor or health care professional as soon as possible: -blurred vision or other vision changes -chest pain -confusion -difficulty breathing -feelings of hostility or anger -muscle aches and pains -numbness or tingling in hands or feet -ringing in the ears -skin rash and itching -vomiting -weakness Side effects that usually do not require medical attention (report to your doctor or health care professional if they continue or are bothersome): -disturbed dreams, nightmares -headache -nausea -restlessness or nervousness -sore throat and nasal congestion -stomach upset This list may not describe all possible side effects. Call your doctor for medical advice about side effects. You may report  side effects to FDA at 1-800-FDA-1088. Where should I keep my medicine? Keep out of the reach of children. Store at room  temperature below 30 degrees C (86 degrees F). Protect from light. Keep container tightly closed. Throw away any unused medicine after the expiration date. NOTE: This sheet is a summary. It may not cover all possible information. If you have questions about this medicine, talk to your doctor, pharmacist, or health care provider.    2016, [employer] Standard. (2009-11-09 18:06:11)

## 2016-03-13 ENCOUNTER — Encounter: Payer: Self-pay | Admitting: Physician Assistant

## 2016-03-13 ENCOUNTER — Ambulatory Visit (INDEPENDENT_AMBULATORY_CARE_PROVIDER_SITE_OTHER): Payer: BLUE CROSS/BLUE SHIELD | Admitting: Physician Assistant

## 2016-03-13 VITALS — BP 134/84 | Temp 97.7°F | Resp 16 | Wt 203.0 lb

## 2016-03-13 DIAGNOSIS — F411 Generalized anxiety disorder: Secondary | ICD-10-CM

## 2016-03-13 DIAGNOSIS — D171 Benign lipomatous neoplasm of skin and subcutaneous tissue of trunk: Secondary | ICD-10-CM

## 2016-03-13 DIAGNOSIS — D352 Benign neoplasm of pituitary gland: Secondary | ICD-10-CM

## 2016-03-13 MED ORDER — ALPRAZOLAM 0.25 MG PO TABS: 0.2500 mg | ORAL_TABLET | Freq: Three times a day (TID) | ORAL | 0 refills | Status: DC | PRN

## 2016-03-13 MED ORDER — CITALOPRAM HYDROBROMIDE 10 MG PO TABS: 10.0000 mg | ORAL_TABLET | Freq: Every day | ORAL | 0 refills | Status: DC

## 2016-03-13 NOTE — Patient Instructions (Signed)
Citalopram tablets What is this medicine? CITALOPRAM (sye TAL oh pram) is a medicine for depression. This medicine may be used for other purposes; ask your health care provider or pharmacist if you have questions. COMMON BRAND NAME(S): Celexa What should I tell my health care provider before I take this medicine? They need to know if you have any of these conditions: -bleeding disorders -bipolar disorder or a family history of bipolar disorder -glaucoma -heart disease -history of irregular heartbeat -kidney disease -liver disease -low levels of magnesium or potassium in the blood -receiving electroconvulsive therapy -seizures -suicidal thoughts, plans, or attempt; a previous suicide attempt by you or a family member -take medicines that treat or prevent blood clots -thyroid disease -an unusual or allergic reaction to citalopram, escitalopram, other medicines, foods, dyes, or preservatives -pregnant or trying to become pregnant -breast-feeding How should I use this medicine? Take this medicine by mouth with a glass of water. Follow the directions on the prescription label. You can take it with or without food. Take your medicine at regular intervals. Do not take your medicine more often than directed. Do not stop taking this medicine suddenly except upon the advice of your doctor. Stopping this medicine too quickly may cause serious side effects or your condition may worsen. A special MedGuide will be given to you by the pharmacist with each prescription and refill. Be sure to read this information carefully each time. Talk to your pediatrician regarding the use of this medicine in children. Special care may be needed. Patients over 30 years old may have a stronger reaction and need a smaller dose. Overdosage: If you think you have taken too much of this medicine contact a poison control center or emergency room at once. NOTE: This medicine is only for you. Do not share this medicine with  others. What if I miss a dose? If you miss a dose, take it as soon as you can. If it is almost time for your next dose, take only that dose. Do not take double or extra doses. What may interact with this medicine? Do not take this medicine with any of the following medications: -certain medicines for fungal infections like fluconazole, itraconazole, ketoconazole, posaconazole, voriconazole -cisapride -dofetilide -dronedarone -escitalopram -linezolid -MAOIs like Carbex, Eldepryl, Marplan, Nardil, and Parnate -methylene blue (injected into a vein) -pimozide -thioridazine -ziprasidone This medicine may also interact with the following medications: -alcohol -amphetamines -aspirin and aspirin-like medicines -carbamazepine -certain medicines for depression, anxiety, or psychotic disturbances -certain medicines for infections like chloroquine, clarithromycin, erythromycin, furazolidone, isoniazid, pentamidine -certain medicines for migraine headaches like almotriptan, eletriptan, frovatriptan, naratriptan, rizatriptan, sumatriptan, zolmitriptan -certain medicines for sleep -certain medicines that treat or prevent blood clots like dalteparin, enoxaparin, warfarin -cimetidine -diuretics -fentanyl -lithium -methadone -metoprolol -NSAIDs, medicines for pain and inflammation, like ibuprofen or naproxen -omeprazole -other medicines that prolong the QT interval (cause an abnormal heart rhythm) -procarbazine -rasagiline -supplements like St. John's wort, kava kava, valerian -tramadol -tryptophan This list may not describe all possible interactions. Give your health care provider a list of all the medicines, herbs, non-prescription drugs, or dietary supplements you use. Also tell them if you smoke, drink alcohol, or use illegal drugs. Some items may interact with your medicine. What should I watch for while using this medicine? Tell your doctor if your symptoms do not get better or if they  get worse. Visit your doctor or health care professional for regular checks on your progress. Because it may take several weeks to see the full  effects of this medicine, it is important to continue your treatment as prescribed by your doctor. Patients and their families should watch out for new or worsening thoughts of suicide or depression. Also watch out for sudden changes in feelings such as feeling anxious, agitated, panicky, irritable, hostile, aggressive, impulsive, severely restless, overly excited and hyperactive, or not being able to sleep. If this happens, especially at the beginning of treatment or after a change in dose, call your health care professional. Dennis Bast may get drowsy or dizzy. Do not drive, use machinery, or do anything that needs mental alertness until you know how this medicine affects you. Do not stand or sit up quickly, especially if you are an older patient. This reduces the risk of dizzy or fainting spells. Alcohol may interfere with the effect of this medicine. Avoid alcoholic drinks. Your mouth may get dry. Chewing sugarless gum or sucking hard candy, and drinking plenty of water will help. Contact your doctor if the problem does not go away or is severe. What side effects may I notice from receiving this medicine? Side effects that you should report to your doctor or health care professional as soon as possible: -allergic reactions like skin rash, itching or hives, swelling of the face, lips, or tongue -anxious -black, tarry stools -breathing problems -changes in vision -chest pain -confusion -elevated mood, decreased need for sleep, racing thoughts, impulsive behavior -eye pain -fast, irregular heartbeat -feeling faint or lightheaded, falls -feeling agitated, angry, or irritable -hallucination, loss of contact with reality -loss of balance or coordination -loss of memory -painful or prolonged erections -restlessness, pacing, inability to keep  still -seizures -stiff muscles -suicidal thoughts or other mood changes -trouble sleeping -unusual bleeding or bruising -unusually weak or tired -vomiting Side effects that usually do not require medical attention (report to your doctor or health care professional if they continue or are bothersome): -change in appetite or weight -change in sex drive or performance -dizziness -headache -increased sweating -indigestion, nausea -tremors This list may not describe all possible side effects. Call your doctor for medical advice about side effects. You may report side effects to FDA at 1-800-FDA-1088. Where should I keep my medicine? Keep out of reach of children. Store at room temperature between 15 and 30 degrees C (59 and 86 degrees F). Throw away any unused medicine after the expiration date. NOTE: This sheet is a summary. It may not cover all possible information. If you have questions about this medicine, talk to your doctor, pharmacist, or health care provider.  2017 Elsevier/Gold Standard (2015-09-04 13:18:52) Alprazolam tablets What is this medicine? ALPRAZOLAM (al PRAY zoe lam) is a benzodiazepine. It is used to treat anxiety and panic attacks. This medicine may be used for other purposes; ask your health care provider or pharmacist if you have questions. COMMON BRAND NAME(S): Xanax What should I tell my health care provider before I take this medicine? They need to know if you have any of these conditions: -an alcohol or drug abuse problem -bipolar disorder, depression, psychosis or other mental health conditions -glaucoma -kidney or liver disease -lung or breathing disease -myasthenia gravis -Parkinson's disease -porphyria -seizures or a history of seizures -suicidal thoughts -an unusual or allergic reaction to alprazolam, other benzodiazepines, foods, dyes, or preservatives -pregnant or trying to get pregnant -breast-feeding How should I use this medicine? Take this  medicine by mouth with a glass of water. Follow the directions on the prescription label. Take your medicine at regular intervals. Do not take it more  often than directed. Do not stop taking except on your doctor's advice. A special MedGuide will be given to you by the pharmacist with each prescription and refill. Be sure to read this information carefully each time. Talk to your pediatrician regarding the use of this medicine in children. Special care may be needed. Overdosage: If you think you have taken too much of this medicine contact a poison control center or emergency room at once. NOTE: This medicine is only for you. Do not share this medicine with others. What if I miss a dose? If you miss a dose, take it as soon as you can. If it is almost time for your next dose, take only that dose. Do not take double or extra doses. What may interact with this medicine? Do not take this medicine with any of the following medications: -certain antiviral medicines for HIV or AIDS like delavirdine, indinavir -certain medicines for fungal infections like ketoconazole and itraconazole -narcotic medicines for cough -sodium oxybate This medicine may also interact with the following medications: -alcohol -antihistamines for allergy, cough and cold -certain antibiotics like clarithromycin, erythromycin, isoniazid, rifampin, rifapentine, rifabutin, and troleandomycin -certain medicines for blood pressure, heart disease, irregular heart beat -certain medicines for depression, like amitriptyline, fluoxetine, sertraline -certain medicines for seizures like carbamazepine, oxcarbazepine, phenobarbital, phenytoin, primidone -cimetidine -cyclosporine -male hormones, like estrogens or progestins and birth control pills, patches, rings, or injections -general anesthetics like halothane, isoflurane, methoxyflurane, propofol -grapefruit juice -local anesthetics like lidocaine, pramoxine, tetracaine -medicines  that relax muscles for surgery -narcotic medicines for pain -other antiviral medicines for HIV or AIDS -phenothiazines like chlorpromazine, mesoridazine, prochlorperazine, thioridazine This list may not describe all possible interactions. Give your health care provider a list of all the medicines, herbs, non-prescription drugs, or dietary supplements you use. Also tell them if you smoke, drink alcohol, or use illegal drugs. Some items may interact with your medicine. What should I watch for while using this medicine? Tell your doctor or health care professional if your symptoms do not start to get better or if they get worse. Do not stop taking except on your doctor's advice. You may develop a severe reaction. Your doctor will tell you how much medicine to take. You may get drowsy or dizzy. Do not drive, use machinery, or do anything that needs mental alertness until you know how this medicine affects you. To reduce the risk of dizzy and fainting spells, do not stand or sit up quickly, especially if you are an older patient. Alcohol may increase dizziness and drowsiness. Avoid alcoholic drinks. If you are taking another medicine that also causes drowsiness, you may have more side effects. Give your health care provider a list of all medicines you use. Your doctor will tell you how much medicine to take. Do not take more medicine than directed. Call emergency for help if you have problems breathing or unusual sleepiness. What side effects may I notice from receiving this medicine? Side effects that you should report to your doctor or health care professional as soon as possible: -allergic reactions like skin rash, itching or hives, swelling of the face, lips, or tongue -breathing problems -confusion -loss of balance or coordination -signs and symptoms of low blood pressure like dizziness; feeling faint or lightheaded, falls; unusually weak or tired -suicidal thoughts or other mood changes Side  effects that usually do not require medical attention (report to your doctor or health care professional if they continue or are bothersome): -dizziness -dry mouth -nausea, vomiting -tiredness  This list may not describe all possible side effects. Call your doctor for medical advice about side effects. You may report side effects to FDA at 1-800-FDA-1088. Where should I keep my medicine? Keep out of the reach of children. This medicine can be abused. Keep your medicine in a safe place to protect it from theft. Do not share this medicine with anyone. Selling or giving away this medicine is dangerous and against the law. Store at room temperature between 20 and 25 degrees C (68 and 77 degrees F). This medicine may cause accidental overdose and death if taken by other adults, children, or pets. Mix any unused medicine with a substance like cat litter or coffee grounds. Then throw the medicine away in a sealed container like a sealed bag or a coffee can with a lid. Do not use the medicine after the expiration date. NOTE: This sheet is a summary. It may not cover all possible information. If you have questions about this medicine, talk to your doctor, pharmacist, or health care provider.  2017 Elsevier/Gold Standard (2014-12-29 13:47:25)

## 2016-03-13 NOTE — Progress Notes (Signed)
Patient: Joel Terry Male    DOB: Aug 15, 1965   50 y.o.   MRN: [id number] Visit Date: 03/13/2016  Today's Provider: Mar Daring, PA-C   Chief Complaint  Patient presents with  . Anxiety   Subjective:    HPI Patient comes in today for a follow up on anxiety. He was last seen in the office on 02/21/2016, and he was started on buspirone 49m three times daily prn. He reports that he has only been taking 1-2 tablets daily. Patient reports that he feels like his symptoms are worse since starting the medication. He reports that he feels more nervous/anxious than before.     Allergies  Allergen Reactions  . Sulfa Antibiotics Hives    Increased HR and BP  . Sulfur Rash and Other (See Comments)    Elevated BP and heart rate     Current Outpatient Prescriptions:  .  busPIRone (BUSPAR) 15 MG tablet, Take 1 tablet (15 mg total) by mouth 3 (three) times daily., Disp: 90 tablet, Rfl: 0 .  lisinopril (PRINIVIL,ZESTRIL) 20 MG tablet, Take 1 tablet (20 mg total) by mouth daily., Disp: 90 tablet, Rfl: 3 .  simvastatin (ZOCOR) 20 MG tablet, Take 1 tablet (20 mg total) by mouth at bedtime., Disp: 90 tablet, Rfl: 3 .  SUMAtriptan (IMITREX) 100 MG tablet, Take 1 tablet (100 mg total) by mouth every 2 (two) hours as needed for migraine. May repeat in 2 hours if headache persists or recurs., Disp: 15 tablet, Rfl: 3  Review of Systems  Constitutional: Negative.   Respiratory: Negative.   Cardiovascular: Negative.   Gastrointestinal: Negative.   Neurological: Negative for dizziness, tremors, seizures, syncope, facial asymmetry, speech difficulty, light-headedness, numbness and headaches.  Psychiatric/Behavioral: Positive for agitation, decreased concentration, dysphoric mood and sleep disturbance. Negative for self-injury and suicidal ideas. The patient is nervous/anxious.     Social History  Substance Use Topics  . Smoking status: Never Smoker  . Smokeless tobacco: Never Used  .  Alcohol use Yes     Comment: Occassional   Objective:   BP 134/84   Temp 97.7 F (36.5 C)   Resp 16   Wt 203 lb (92.1 kg)   BMI 30.87 kg/m   Physical Exam  Constitutional: He appears well-developed and well-nourished. No distress.  HENT:  Head: Normocephalic and atraumatic.  Neck: Normal range of motion. Neck supple. No JVD present. No tracheal deviation present. No thyromegaly present.  Cardiovascular: Normal rate, regular rhythm and normal heart sounds.  Exam reveals no gallop and no friction rub.   No murmur heard. Pulmonary/Chest: Effort normal and breath sounds normal. No respiratory distress. He has no wheezes. He has no rales.  Lymphadenopathy:    He has no cervical adenopathy.  Skin: He is not diaphoretic.     Psychiatric: His speech is normal and behavior is normal. Judgment and thought content normal. His mood appears anxious. Cognition and memory are normal.  Vitals reviewed.      Assessment & Plan:     1. GAD (generalized anxiety disorder) Worsening symptoms. I will start citalopram as below and add Xanax for as needed breakthrough panic attack. I will see him back in 4 weeks to see how he is doing with treatment. He is to call the office if he has any acute issue in the meantime. - citalopram (CELEXA) 10 MG tablet; Take 1 tablet (10 mg total) by mouth daily.  Dispense: 30 tablet; Refill: 0 - ALPRAZolam (  XANAX) 0.25 MG tablet; Take 1 tablet (0.25 mg total) by mouth 3 (three) times daily as needed for anxiety.  Dispense: 90 tablet; Refill: 0  2. 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 Patient would like a referral to dermatology for consideration of removal. Discussed that they normally are not removed unless desired or symptomatic due to possibility of recurrence. Patient states that he still would like a referral as he would like it removed for cosmetic reasons. - Ambulatory referral to Dermatology  3.  Adenoma of pituitary [facility]) Patient does have a known pituitary adenoma that was last imaged in March 2016. He would like to have it read imaged as he feels that the increasing anxiety and sinus pressure may be from the adenoma increasing in size. He does not have any visual or neurological deficits. MRI has been ordered as below. I will follow-up with him pending these results. - Creatinine - MR Brain W Wo Contrast; Future       Mar Daring, PA-C  [facility]

## 2016-03-25 ENCOUNTER — Other Ambulatory Visit: Payer: BLUE CROSS/BLUE SHIELD

## 2016-03-27 ENCOUNTER — Ambulatory Visit
Admission: RE | Admit: 2016-03-27 | Discharge: 2016-03-27 | Disposition: A | Discharge status: Home / Self Care | Payer: BLUE CROSS/BLUE SHIELD | Source: Ambulatory Visit | Attending: Physician Assistant | Admitting: Physician Assistant

## 2016-03-27 DIAGNOSIS — D352 Benign neoplasm of pituitary gland: Secondary | ICD-10-CM | POA: Diagnosis not present

## 2016-03-27 LAB — POCT I-STAT CREATININE: CREATININE: 1 mg/dL (ref 0.61–1.24)

## 2016-03-27 MED ORDER — GADOBENATE DIMEGLUMINE 529 MG/ML IV SOLN
10.0000 mL | Freq: Once | INTRAVENOUS | Status: AC | PRN
  Administered 2016-03-27: 10 mL via INTRAVENOUS

## 2016-03-28 ENCOUNTER — Telehealth: Payer: Self-pay

## 2016-03-28 NOTE — Telephone Encounter (Signed)
We can mail results of both MRI or Prandin have patient pick up something he can have a copy. The thing that was noticed was that there was an enlarged sella with cerebral spinal fluid (normal to be filled with cerebral spinal fluid) and some pituitary tissue noted in the sella as well. No true pituitary adenoma noted. This was essentially unchanged from the MRI that was done in March 2016. I do not know if any further workup for this but will refer to neurology if patient wishes.

## 2016-03-28 NOTE — Telephone Encounter (Signed)
LMTCB-KW 

## 2016-03-28 NOTE — Telephone Encounter (Signed)
-----   Message from Mar Daring, [place] sent at 03/28/2016  8:36 AM EST ----- MRI was unremarkable and essentially unchanged from previous MRI from 06/28/2014.

## 2016-03-28 NOTE — Telephone Encounter (Signed)
Patient was advised he states that he never received MRI results from last report in 2016 so he is requesting more detail of what was seen on MRI what was unchanged? If there is anything he should be worried about? Or if he will need any further workup/ follow up. KW

## 2016-04-02 NOTE — Telephone Encounter (Signed)
LMTCB  Thanks,  -Philipp Callegari 

## 2016-04-04 NOTE — Telephone Encounter (Signed)
LM for Joel Terry again. This is the third time trying to contact patient and no answer. Have left messages.  Closing Chart.  Thanks,  -Joseline

## 2016-04-10 ENCOUNTER — Ambulatory Visit (INDEPENDENT_AMBULATORY_CARE_PROVIDER_SITE_OTHER): Payer: BLUE CROSS/BLUE SHIELD | Admitting: Physician Assistant

## 2016-04-10 ENCOUNTER — Encounter: Payer: Self-pay | Admitting: Physician Assistant

## 2016-04-10 VITALS — BP 150/62 | HR 77 | Temp 98.2°F | Resp 16 | Wt 205.6 lb

## 2016-04-10 DIAGNOSIS — F411 Generalized anxiety disorder: Secondary | ICD-10-CM

## 2016-04-10 DIAGNOSIS — F3341 Major depressive disorder, recurrent, in partial remission: Secondary | ICD-10-CM | POA: Diagnosis not present

## 2016-04-10 MED ORDER — CITALOPRAM HYDROBROMIDE 20 MG PO TABS: 20.0000 mg | ORAL_TABLET | Freq: Every day | ORAL | 0 refills | Status: DC

## 2016-04-10 NOTE — Patient Instructions (Signed)
Citalopram tablets What is this medicine? CITALOPRAM (sye TAL oh pram) is a medicine for depression. This medicine may be used for other purposes; ask your health care provider or pharmacist if you have questions. COMMON BRAND NAME(S): Celexa What should I tell my health care provider before I take this medicine? They need to know if you have any of these conditions: -bleeding disorders -bipolar disorder or a family history of bipolar disorder -glaucoma -heart disease -history of irregular heartbeat -kidney disease -liver disease -low levels of magnesium or potassium in the blood -receiving electroconvulsive therapy -seizures -suicidal thoughts, plans, or attempt; a previous suicide attempt by you or a family member -take medicines that treat or prevent blood clots -thyroid disease -an unusual or allergic reaction to citalopram, escitalopram, other medicines, foods, dyes, or preservatives -pregnant or trying to become pregnant -breast-feeding How should I use this medicine? Take this medicine by mouth with a glass of water. Follow the directions on the prescription label. You can take it with or without food. Take your medicine at regular intervals. Do not take your medicine more often than directed. Do not stop taking this medicine suddenly except upon the advice of your doctor. Stopping this medicine too quickly may cause serious side effects or your condition may worsen. A special MedGuide will be given to you by the pharmacist with each prescription and refill. Be sure to read this information carefully each time. Talk to your pediatrician regarding the use of this medicine in children. Special care may be needed. Patients over 48 years old may have a stronger reaction and need a smaller dose. Overdosage: If you think you have taken too much of this medicine contact a poison control center or emergency room at once. NOTE: This medicine is only for you. Do not share this medicine with  others. What if I miss a dose? If you miss a dose, take it as soon as you can. If it is almost time for your next dose, take only that dose. Do not take double or extra doses. What may interact with this medicine? Do not take this medicine with any of the following medications: -certain medicines for fungal infections like fluconazole, itraconazole, ketoconazole, posaconazole, voriconazole -cisapride -dofetilide -dronedarone -escitalopram -linezolid -MAOIs like Carbex, Eldepryl, Marplan, Nardil, and Parnate -methylene blue (injected into a vein) -pimozide -thioridazine -ziprasidone This medicine may also interact with the following medications: -alcohol -amphetamines -aspirin and aspirin-like medicines -carbamazepine -certain medicines for depression, anxiety, or psychotic disturbances -certain medicines for infections like chloroquine, clarithromycin, erythromycin, furazolidone, isoniazid, pentamidine -certain medicines for migraine headaches like almotriptan, eletriptan, frovatriptan, naratriptan, rizatriptan, sumatriptan, zolmitriptan -certain medicines for sleep -certain medicines that treat or prevent blood clots like dalteparin, enoxaparin, warfarin -cimetidine -diuretics -fentanyl -lithium -methadone -metoprolol -NSAIDs, medicines for pain and inflammation, like ibuprofen or naproxen -omeprazole -other medicines that prolong the QT interval (cause an abnormal heart rhythm) -procarbazine -rasagiline -supplements like St. John's wort, kava kava, valerian -tramadol -tryptophan This list may not describe all possible interactions. Give your health care provider a list of all the medicines, herbs, non-prescription drugs, or dietary supplements you use. Also tell them if you smoke, drink alcohol, or use illegal drugs. Some items may interact with your medicine. What should I watch for while using this medicine? Tell your doctor if your symptoms do not get better or if they  get worse. Visit your doctor or health care professional for regular checks on your progress. Because it may take several weeks to see the full  effects of this medicine, it is important to continue your treatment as prescribed by your doctor. Patients and their families should watch out for new or worsening thoughts of suicide or depression. Also watch out for sudden changes in feelings such as feeling anxious, agitated, panicky, irritable, hostile, aggressive, impulsive, severely restless, overly excited and hyperactive, or not being able to sleep. If this happens, especially at the beginning of treatment or after a change in dose, call your health care professional. Dennis Bast may get drowsy or dizzy. Do not drive, use machinery, or do anything that needs mental alertness until you know how this medicine affects you. Do not stand or sit up quickly, especially if you are an older patient. This reduces the risk of dizzy or fainting spells. Alcohol may interfere with the effect of this medicine. Avoid alcoholic drinks. Your mouth may get dry. Chewing sugarless gum or sucking hard candy, and drinking plenty of water will help. Contact your doctor if the problem does not go away or is severe. What side effects may I notice from receiving this medicine? Side effects that you should report to your doctor or health care professional as soon as possible: -allergic reactions like skin rash, itching or hives, swelling of the face, lips, or tongue -anxious -black, tarry stools -breathing problems -changes in vision -chest pain -confusion -elevated mood, decreased need for sleep, racing thoughts, impulsive behavior -eye pain -fast, irregular heartbeat -feeling faint or lightheaded, falls -feeling agitated, angry, or irritable -hallucination, loss of contact with reality -loss of balance or coordination -loss of memory -painful or prolonged erections -restlessness, pacing, inability to keep  still -seizures -stiff muscles -suicidal thoughts or other mood changes -trouble sleeping -unusual bleeding or bruising -unusually weak or tired -vomiting Side effects that usually do not require medical attention (report to your doctor or health care professional if they continue or are bothersome): -change in appetite or weight -change in sex drive or performance -dizziness -headache -increased sweating -indigestion, nausea -tremors This list may not describe all possible side effects. Call your doctor for medical advice about side effects. You may report side effects to FDA at 1-800-FDA-1088. Where should I keep my medicine? Keep out of reach of children. Store at room temperature between 15 and 30 degrees C (59 and 86 degrees F). Throw away any unused medicine after the expiration date. NOTE: This sheet is a summary. It may not cover all possible information. If you have questions about this medicine, talk to your doctor, pharmacist, or health care provider.  2017 Elsevier/Gold Standard (2015-09-04 13:18:52)

## 2016-04-10 NOTE — Progress Notes (Signed)
Patient: Joel Terry Male    DOB: Feb 25, 1966   50 y.o.   MRN: [id number] Visit Date: 04/10/2016  Today's Provider: Mar Daring, PA-C   Chief Complaint  Patient presents with  . Follow-up    GAD   Subjective:    HPI Patient is here for 4 week follow-up GADWhich was worsening at the last office visit. Patient was started on Citalopram 57m and  added Xanax for as needed breakthrough panic attack. He reports that he has not noticed much improvement. It did increased his appetite a little. He reports that he has not had any acute panic attacks and has not needed to take the Xanax but does state that he now has a generalized anxious state at baseline consistently, which is different from previous. Prior to starting citalopram he did not have baseline anxiety but was having severe panic attacks that were debilitating.      Allergies  Allergen Reactions  . Sulfa Antibiotics Hives    Increased HR and BP  . Sulfur Rash and Other (See Comments)    Elevated BP and heart rate     Current Outpatient Prescriptions:  .  ALPRAZolam (XANAX) 0.25 MG tablet, Take 1 tablet (0.25 mg total) by mouth 3 (three) times daily as needed for anxiety., Disp: 90 tablet, Rfl: 0 .  citalopram (CELEXA) 10 MG tablet, Take 1 tablet (10 mg total) by mouth daily., Disp: 30 tablet, Rfl: 0 .  lisinopril (PRINIVIL,ZESTRIL) 20 MG tablet, Take 1 tablet (20 mg total) by mouth daily., Disp: 90 tablet, Rfl: 3 .  simvastatin (ZOCOR) 20 MG tablet, Take 1 tablet (20 mg total) by mouth at bedtime., Disp: 90 tablet, Rfl: 3 .  SUMAtriptan (IMITREX) 100 MG tablet, Take 1 tablet (100 mg total) by mouth every 2 (two) hours as needed for migraine. May repeat in 2 hours if headache persists or recurs., Disp: 15 tablet, Rfl: 3  Review of Systems  Constitutional: Positive for fatigue.  Respiratory: Negative.   Cardiovascular: Negative.   Gastrointestinal: Negative.   Neurological: Negative for dizziness and  headaches.  Psychiatric/Behavioral: Positive for dysphoric mood and sleep disturbance. Negative for agitation, decreased concentration, self-injury and suicidal ideas. The patient is nervous/anxious.     Social History  Substance Use Topics  . Smoking status: Never Smoker  . Smokeless tobacco: Never Used  . Alcohol use Yes     Comment: Occassional   Objective:   BP (!) 150/62 (BP Location: Right Arm, Patient Position: Sitting, Cuff Size: Normal)   Pulse 77   Temp 98.2 F (36.8 C) (Oral)   Resp 16   Wt 205 lb 9.6 oz (93.3 kg)   BMI 31.26 kg/m   Physical Exam  Constitutional: He appears well-developed and well-nourished. No distress.  HENT:  Head: Normocephalic and atraumatic.  Neck: Normal range of motion. Neck supple.  Cardiovascular: Normal rate, regular rhythm and normal heart sounds.  Exam reveals no gallop and no friction rub.   No murmur heard. Pulmonary/Chest: Effort normal and breath sounds normal. No respiratory distress. He has no wheezes. He has no rales.  Skin: He is not diaphoretic.  Psychiatric: His speech is normal and behavior is normal. Judgment and thought content normal. His mood appears anxious. Cognition and memory are normal.  Vitals reviewed.      Assessment & Plan:     1. GAD (generalized anxiety disorder) Still not to goal. We'll increase Celexa to 20 mg as below. Patient is  to call in 2 weeks if he is having any change in symptoms and we will discontinue and switch to a different medication, most likely sertraline. I will see him back in approximately 6-8 weeks for recheck. - citalopram (CELEXA) 20 MG tablet; Take 1 tablet (20 mg total) by mouth daily.  Dispense: 30 tablet; Refill: 0  2. Recurrent major depressive disorder, in partial remission ([place]) See above medical treatment plan. - citalopram (CELEXA) 20 MG tablet; Take 1 tablet (20 mg total) by mouth daily.  Dispense: 30 tablet; Refill: 0       Mar Daring, PA-C  [facility]

## 2016-04-18 ENCOUNTER — Other Ambulatory Visit: Payer: Self-pay | Admitting: Physician Assistant

## 2016-04-18 DIAGNOSIS — E785 Hyperlipidemia, unspecified: Secondary | ICD-10-CM

## 2016-05-13 ENCOUNTER — Other Ambulatory Visit: Payer: Self-pay

## 2016-05-13 DIAGNOSIS — I10 Essential (primary) hypertension: Secondary | ICD-10-CM

## 2016-05-13 MED ORDER — LISINOPRIL 20 MG PO TABS: 20.0000 mg | ORAL_TABLET | Freq: Every day | ORAL | 3 refills | Status: DC

## 2016-05-13 NOTE — Telephone Encounter (Signed)
Pharmacy requesting refill Last ov 04/10/16 Last filled 07/14/15. Please review. Thank you. sd

## 2016-05-20 DIAGNOSIS — L720 Epidermal cyst: Secondary | ICD-10-CM | POA: Diagnosis not present

## 2016-05-20 DIAGNOSIS — L728 Other follicular cysts of the skin and subcutaneous tissue: Secondary | ICD-10-CM | POA: Diagnosis not present

## 2016-05-22 ENCOUNTER — Ambulatory Visit: Payer: BLUE CROSS/BLUE SHIELD | Admitting: Physician Assistant

## 2016-06-18 ENCOUNTER — Ambulatory Visit (INDEPENDENT_AMBULATORY_CARE_PROVIDER_SITE_OTHER): Payer: BLUE CROSS/BLUE SHIELD | Admitting: Family Medicine

## 2016-06-18 ENCOUNTER — Encounter: Payer: Self-pay | Admitting: Family Medicine

## 2016-06-18 ENCOUNTER — Telehealth: Payer: Self-pay

## 2016-06-18 VITALS — BP 122/78 | HR 80 | Temp 98.5°F | Resp 16 | Wt 203.0 lb

## 2016-06-18 DIAGNOSIS — J0100 Acute maxillary sinusitis, unspecified: Secondary | ICD-10-CM

## 2016-06-18 LAB — POCT INFLUENZA A/B
INFLUENZA A, POC: NEGATIVE
INFLUENZA B, POC: NEGATIVE

## 2016-06-18 MED ORDER — AMOXICILLIN 500 MG PO CAPS: 1000.0000 mg | ORAL_CAPSULE | Freq: Three times a day (TID) | ORAL | 0 refills | Status: AC

## 2016-06-18 NOTE — Progress Notes (Signed)
Patient: Joel Terry Male    DOB: [birth year]   51 y.o.   MRN: [id number] Visit Date: 06/18/2016  Today's Provider: Lelon Huh, MD   Chief Complaint  Patient presents with  . Chills   Subjective:    HPI Patient presents c/o body aches and chills. He denies any cough or congestion. He reports that he has not taken his temperature, but he has felt feverish. Patient reports that his symptoms started yesterday afternoon. Patient reports that during dinner, he had severe chills and body aches. While riding home in the passengers seat he passed out twice. Has been taking Tylenol which relieves chills and aches. Has also had bilateral frontal and maxillary pain c/w past sinus headaches. No other neurologically symptoms.     Allergies  Allergen Reactions  . Sulfa Antibiotics Hives    Increased HR and BP  . Sulfur Rash and Other (See Comments)    Elevated BP and heart rate     Current Outpatient Prescriptions:  .  ALPRAZolam (XANAX) 0.25 MG tablet, Take 1 tablet (0.25 mg total) by mouth 3 (three) times daily as needed for anxiety., Disp: 90 tablet, Rfl: 0 .  citalopram (CELEXA) 20 MG tablet, Take 1 tablet (20 mg total) by mouth daily., Disp: 30 tablet, Rfl: 0 .  lisinopril (PRINIVIL,ZESTRIL) 20 MG tablet, Take 1 tablet (20 mg total) by mouth daily., Disp: 90 tablet, Rfl: 3 .  simvastatin (ZOCOR) 20 MG tablet, TAKE 1 TABLET AT BEDTIME, Disp: 90 tablet, Rfl: 3 .  SUMAtriptan (IMITREX) 100 MG tablet, Take 1 tablet (100 mg total) by mouth every 2 (two) hours as needed for migraine. May repeat in 2 hours if headache persists or recurs., Disp: 15 tablet, Rfl: 3  Review of Systems  Constitutional: Positive for activity change, appetite change and fatigue.  HENT: Negative for congestion, sinus pressure and sore throat.   Respiratory: Negative for chest tightness and shortness of breath.   Cardiovascular: Negative for chest pain and palpitations.  Musculoskeletal: Positive for myalgias.   Skin: Negative.   Neurological: Positive for headaches.    Social History  Substance Use Topics  . Smoking status: Never Smoker  . Smokeless tobacco: Never Used  . Alcohol use Yes     Comment: Occassional   Objective:   BP 122/78 (BP Location: Right Arm, Patient Position: Sitting, Cuff Size: Normal)   Pulse 80   Temp 98.5 F (36.9 C)   Resp 16   Wt 203 lb (92.1 kg)   SpO2 97%   BMI 30.87 kg/m    Physical Exam  General Appearance:    Alert, cooperative, no distress  HENT:   bilateral TM normal without fluid or infection, neck without nodes, frontal and maxillary sinuses tender and nasal mucosa congested  Eyes:    PERRL, conjunctiva/corneas clear, EOM's intact       Lungs:     Clear to auscultation bilaterally, respirations unlabored  Heart:    Regular rate and rhythm  Neurologic:   Awake, alert, oriented x 3. No apparent focal neurological           defect.       Results for orders placed or performed in visit on 06/18/16  POCT Influenza A/B  Result Value Ref Range   Influenza A, POC Negative Negative   Influenza B, POC Negative Negative         Assessment & Plan:     1. Acute non-recurrent maxillary sinusitis  -  amoxicillin (AMOXIL) 500 MG capsule; Take 2 capsules (1,000 mg total) by mouth 3 (three) times daily.  Dispense: 30 capsule; Refill: 0 - POCT Influenza A/B   Call if symptoms change or if not rapidly improving.          Lelon Huh, MD  [facility]

## 2016-06-18 NOTE — Telephone Encounter (Signed)
Patient C/O flu like symptoms, reports chill, body ache, head ache and fatigue. Patient reports that last night he passed out 2 times consecutive for a few seconds. Patient denies chest pain, shortness of breath, or cough. Patient reports head ache is bad but not as severe as a migraine head ache. Patient reports he has taken 1500 mg of Tylenol.  Per Dr. Caryn Section pt at 3:45 pm. sd

## 2016-06-19 ENCOUNTER — Telehealth: Payer: Self-pay | Admitting: Physician Assistant

## 2016-06-19 ENCOUNTER — Encounter: Payer: Self-pay | Admitting: Physician Assistant

## 2016-06-19 NOTE — Telephone Encounter (Signed)
Letter printed.

## 2016-06-19 NOTE — Telephone Encounter (Signed)
Patient advised.

## 2016-06-19 NOTE — Telephone Encounter (Signed)
Pt is requesting a work note stating he does not have the flu and that it is ok for him to go back to work on Thursday.  CB#[phone]/MW

## 2016-09-25 ENCOUNTER — Other Ambulatory Visit: Payer: Self-pay | Admitting: Physician Assistant

## 2016-09-25 DIAGNOSIS — F411 Generalized anxiety disorder: Secondary | ICD-10-CM

## 2016-09-25 NOTE — Telephone Encounter (Signed)
RX called in at [facility] pharmacy  

## 2016-12-02 ENCOUNTER — Ambulatory Visit (INDEPENDENT_AMBULATORY_CARE_PROVIDER_SITE_OTHER): Payer: BLUE CROSS/BLUE SHIELD | Admitting: Physician Assistant

## 2016-12-02 ENCOUNTER — Encounter: Payer: Self-pay | Admitting: Physician Assistant

## 2016-12-02 VITALS — BP 120/70 | HR 68 | Temp 97.6°F | Resp 16 | Wt 210.4 lb

## 2016-12-02 DIAGNOSIS — K529 Noninfective gastroenteritis and colitis, unspecified: Secondary | ICD-10-CM | POA: Diagnosis not present

## 2016-12-02 DIAGNOSIS — K5190 Ulcerative colitis, unspecified, without complications: Secondary | ICD-10-CM | POA: Diagnosis not present

## 2016-12-02 DIAGNOSIS — R55 Syncope and collapse: Secondary | ICD-10-CM | POA: Diagnosis not present

## 2016-12-02 NOTE — Progress Notes (Signed)
Patient: Joel Terry Male    DOB: 08-31-1965   51 y.o.   MRN: [id number] Visit Date: 12/02/2016  Today's Provider: Mar Daring, PA-C   Chief Complaint  Patient presents with  . Abdominal Pain   Subjective:    Abdominal Pain  This is a new problem. The current episode started yesterday (last night). The onset quality is sudden. The problem occurs constantly. The problem has been gradually improving. The pain is located in the periumbilical region. The pain is at a severity of 3/10. The pain is mild. The quality of the pain is cramping and aching. The abdominal pain does not radiate. Associated symptoms include constipation, diarrhea, frequency, headaches, nausea and vomiting. Pertinent negatives include no belching, fever, hematuria or weight loss. The pain is aggravated by being still and movement. The pain is relieved by nothing. He has tried nothing (drink plenty of fluids) for the symptoms. His past medical history is significant for abdominal surgery and ulcerative colitis.  He reports the he passed out at midnight last night for a second and hit his chin. He reports that around 2 am this morning he got up and went straight to the floor like around 10 seconds.      Allergies  Allergen Reactions  . Sulfa Antibiotics Hives    Increased HR and BP  . Sulfur Rash and Other (See Comments)    Elevated BP and heart rate     Current Outpatient Prescriptions:  .  ALPRAZolam (XANAX) 0.25 MG tablet, TAKE 1 TABLET THREE TIMES A DAY AS NEEDED FOR ANXIETY, Disp: 90 tablet, Rfl: 1 .  lisinopril (PRINIVIL,ZESTRIL) 20 MG tablet, Take 1 tablet (20 mg total) by mouth daily., Disp: 90 tablet, Rfl: 3 .  simvastatin (ZOCOR) 20 MG tablet, TAKE 1 TABLET AT BEDTIME, Disp: 90 tablet, Rfl: 3 .  SUMAtriptan (IMITREX) 100 MG tablet, Take 1 tablet (100 mg total) by mouth every 2 (two) hours as needed for migraine. May repeat in 2 hours if headache persists or recurs., Disp: 15 tablet, Rfl:  3 .  citalopram (CELEXA) 20 MG tablet, Take 1 tablet (20 mg total) by mouth daily. (Patient not taking: Reported on 12/02/2016), Disp: 30 tablet, Rfl: 0  Review of Systems  Constitutional: Negative for fever and weight loss.  Respiratory: Negative.   Cardiovascular: Negative for chest pain, palpitations and leg swelling.  Gastrointestinal: Positive for abdominal pain, constipation, diarrhea, nausea and vomiting.  Genitourinary: Positive for frequency. Negative for hematuria.  Neurological: Positive for syncope, weakness (today), light-headedness and headaches.    Social History  Substance Use Topics  . Smoking status: Never Smoker  . Smokeless tobacco: Never Used  . Alcohol use Yes     Comment: Occassional   Objective:   BP 120/70 (BP Location: Left Arm, Patient Position: Sitting, Cuff Size: Normal)   Pulse 68   Temp 97.6 F (36.4 C) (Oral)   Resp 16   Wt 210 lb 6.4 oz (95.4 kg)   SpO2 97%   BMI 31.99 kg/m  Vitals:   12/02/16 1344  BP: 120/70  Pulse: 68  Resp: 16  Temp: 97.6 F (36.4 C)  TempSrc: Oral  SpO2: 97%  Weight: 210 lb 6.4 oz (95.4 kg)     Physical Exam  Constitutional: He is oriented to person, place, and time. He appears well-developed and well-nourished. No distress.  Cardiovascular: Normal rate, regular rhythm and normal heart sounds.  Exam reveals no gallop and no friction rub.  No murmur heard. Pulmonary/Chest: Effort normal and breath sounds normal. No respiratory distress. He has no wheezes. He has no rales.  Abdominal: Soft. Normal appearance and bowel sounds are normal. He exhibits no distension and no mass. There is no hepatosplenomegaly. There is generalized tenderness. There is no rebound, no guarding and no CVA tenderness.  Neurological: He is alert and oriented to person, place, and time. He has normal strength. No cranial nerve deficit or sensory deficit. He displays a negative Romberg sign. Coordination and gait normal.  Skin: Skin is warm  and dry. He is not diaphoretic.  Vitals reviewed.     Assessment & Plan:     1. Gastroenteritis Suspect viral gastroenteritis since patient is starting to improve. Discussed bland diet and slowly reincorporating foods. Push fluids and call if symptoms worsen.  2. Vasovagal syncope Suspect vasovagal syncope due to illness and dehydration. He is to call if any neuro symptoms develop or another episode of syncope. Patient declined labs and neuro referral at this time.   3. Ulcerative colitis without complications, unspecified location [facility]) H/O this s/p bowel surgery (surgical resection) and history of SBO. States it does not feel similar.        Mar Daring, PA-C  [facility]

## 2016-12-02 NOTE — Patient Instructions (Signed)
Vasovagal Syncope, Adult  Syncope, which is commonly known as fainting or passing out, is a temporary loss of consciousness. It occurs when the blood flow to the brain is reduced. Vasovagal syncope, also called neurocardiogenic syncope, is a fainting spell that happens when blood flow to the brain is reduced because of a sudden drop in heart rate and blood pressure.  Vasovagal syncope is usually harmless. However, you can get injured if you fall during a fainting spell.  What are the causes?  This condition is caused by a drop in heart rate and blood pressure, usually in response to a trigger. Many things and situations can trigger an episode, including:  · Pain.  · Fear.  · The sight of blood. This may occur during medical procedures, such as when blood is being drawn from a vein.  · Common activities, such as coughing, swallowing, stretching, or going to the bathroom.  · Emotional stress.  · Being in a confined space.  · Prolonged standing, especially in a warm environment.  · Lack of sleep or rest.  · Not eating for a long time.  · Not drinking enough liquids.  · Recent illness.  · Drinking alcohol.  · Taking drugs that affect blood pressure, such as marijuana, cocaine, opiates, or inhalants.    What are the signs or symptoms?  Before a fainting episode, you may:  · Feel dizzy or light-headed.  · Become pale.  · Sense that you are going to faint.  · Feel like the room is spinning.  · Only see directly ahead (tunnel vision).  · Feel sick to your stomach (nauseous).  · See spots.  · Slowly lose vision.  · Hear ringing in your ears.  · Have a headache.  · Feel warm and sweaty.  · Feel a sensation of pins and needles.    During the fainting spell, you may twitch or make jerky movements. Fainting spells usually last no longer than a few minutes before you wake up. If you get up too quickly before your body can recover, you may faint again.  How is this diagnosed?  This condition is diagnosed based on your symptoms,  your medical history, and a physical exam. Tests may be done to rule out other causes of fainting. Tests may include:  · Blood tests.  · Heart tests, such as an electrocardiogram (ECG), echocardiogram, or electrophysiology study.  · A test to check your response to changes in position (tilt table test).    How is this treated?  Usually, treatment is not needed for this condition. Your health care provider may suggest ways to help prevent fainting episodes. These may include:  · Drinking additional fluids if you are exposed to a trigger.  · Sitting or lying down if you notice signs that an episode is coming.    If your fainting spells continue, your health care provider may recommend that you:  · Take medicines to prevent fainting or to help reduce further episodes of fainting.  · Do certain exercises.  · Wear compression stockings.  · Have surgery to place a pacemaker in your body (rare).    Follow these instructions at home:  · Learn to identify the signs that an episode is coming.  · Sit or lie down at the first sign of a fainting spell. If you sit down, put your head down between your legs. If you lie down, swing your legs up in the air to increase blood flow to the brain.  ·   Avoid hot tubs and saunas.  · Avoid standing for a long time. If you have to stand for a long time, try:  ? Crossing your legs.  ? Flexing and stretching your leg muscles.  ? Squatting.  ? Moving your legs.  ? Bending over.  · Drink enough fluid to keep your urine clear or pale yellow.  · Make changes to your diet that your health care provider recommends. You may be told to:  ? Avoid caffeine.  ? Eat more salt.  · Take over-the-counter and prescription medicines only as told by your health care provider.  Contact a health care provider if:  · You continue to have fainting spells despite treatment.  · You faint more often despite treatment.  · You lose consciousness for more than a few minutes.  · You faint during or after exercising or  after being startled.  · You have twitching or jerky movements for longer than a few seconds during a fainting spell.  · You have an episode of twitching or jerky movements without fainting.  Get help right away if:  · A fainting spell leads to an injury or bleeding.  · You have new symptoms that occur with the fainting spells, such as:  ? Shortness of breath.  ? Chest pain.  ? Irregular heartbeat.  · You twitch or make jerky movements for more than 5 minutes.  · You twitch or make jerky movements during more than one fainting spell.  This information is not intended to replace advice given to you by your health care provider. Make sure you discuss any questions you have with your health care provider.  Document Released: 03/18/2012 Document Revised: 09/13/2015 Document Reviewed: 01/28/2015  Elsevier Interactive Patient Education © 2018 [employer]

## 2016-12-23 ENCOUNTER — Encounter: Payer: Self-pay | Admitting: Physician Assistant

## 2016-12-23 ENCOUNTER — Ambulatory Visit (INDEPENDENT_AMBULATORY_CARE_PROVIDER_SITE_OTHER): Payer: BLUE CROSS/BLUE SHIELD | Admitting: Physician Assistant

## 2016-12-23 VITALS — BP 118/70 | HR 74 | Temp 98.3°F | Resp 16 | Ht 68.0 in | Wt 212.0 lb

## 2016-12-23 DIAGNOSIS — N138 Other obstructive and reflux uropathy: Secondary | ICD-10-CM | POA: Diagnosis not present

## 2016-12-23 DIAGNOSIS — Z2821 Immunization not carried out because of patient refusal: Secondary | ICD-10-CM

## 2016-12-23 DIAGNOSIS — I10 Essential (primary) hypertension: Secondary | ICD-10-CM | POA: Diagnosis not present

## 2016-12-23 DIAGNOSIS — Z0000 Encounter for general adult medical examination without abnormal findings: Secondary | ICD-10-CM

## 2016-12-23 DIAGNOSIS — D352 Benign neoplasm of pituitary gland: Secondary | ICD-10-CM

## 2016-12-23 DIAGNOSIS — E7800 Pure hypercholesterolemia, unspecified: Secondary | ICD-10-CM

## 2016-12-23 DIAGNOSIS — Z6832 Body mass index (BMI) 32.0-32.9, adult: Secondary | ICD-10-CM

## 2016-12-23 DIAGNOSIS — K5190 Ulcerative colitis, unspecified, without complications: Secondary | ICD-10-CM | POA: Diagnosis not present

## 2016-12-23 DIAGNOSIS — Z131 Encounter for screening for diabetes mellitus: Secondary | ICD-10-CM

## 2016-12-23 DIAGNOSIS — Z125 Encounter for screening for malignant neoplasm of prostate: Secondary | ICD-10-CM

## 2016-12-23 DIAGNOSIS — Z1211 Encounter for screening for malignant neoplasm of colon: Secondary | ICD-10-CM | POA: Diagnosis not present

## 2016-12-23 DIAGNOSIS — N401 Enlarged prostate with lower urinary tract symptoms: Secondary | ICD-10-CM

## 2016-12-23 NOTE — Progress Notes (Signed)
Patient: Joel Terry, Male    DOB: 08-22-1965, 51 y.o.   MRN: [id number] Visit Date: 12/23/2016  Today's Provider: Mar Daring, PA-C   Chief Complaint  Patient presents with  . Annual Exam   Subjective:    Annual physical exam Joel Terry is a 51 y.o. male who presents today for health maintenance and complete physical. He feels well. He reports exercising. He reports he is sleeping well.  Influenza Vaccine: Declined today. Last CPE:07/13/15 PSA:07/14/15 Normal -----------------------------------------------------------------   Review of Systems  Constitutional: Negative.   HENT: Positive for tinnitus.   Eyes: Negative.   Respiratory: Negative.   Cardiovascular: Negative.   Gastrointestinal: Negative.   Endocrine: Negative.   Genitourinary: Negative.   Musculoskeletal: Positive for back pain.  Skin: Negative.   Allergic/Immunologic: Negative.   Neurological: Positive for syncope.  Hematological: Negative.   Psychiatric/Behavioral: Negative.     Social History      He  reports that he has never smoked. He has never used smokeless tobacco. He reports that he drinks alcohol. He reports that he does not use drugs.       Social History   Social History  . Marital status: Married    Spouse name: N/A  . Number of children: N/A  . Years of education: N/A   Social History Main Topics  . Smoking status: Never Smoker  . Smokeless tobacco: Never Used  . Alcohol use Yes     Comment: Occassional  . Drug use: No  . Sexual activity: Not Asked   Other Topics Concern  . None   Social History Narrative  . None    Past Medical History:  Diagnosis Date  . Asthma   . Chronic kidney disease   . Headache   . Hypertension   . Nephrolithiasis   . Obesity   . Ulcerative colitis [facility])      Patient Active Problem List   Diagnosis Date Noted  . Incomplete emptying of bladder 12/01/2015  . 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    . H/O renal calculi 04/26/2015  . Benign prostatic hyperplasia with urinary obstruction 04/26/2015  . Migraines 01/13/2015  . Ulcerative colitis ([place]) 01/13/2015  . 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 Allergic rhinitis 12/16/2013  . Benign essential HTN 12/16/2013  . HLD (hyperlipidemia) 12/16/2013    Past Surgical History:  Procedure Laterality Date  . COLECTOMY    . COLONOSCOPY WITH PROPOFOL N/A 09/16/2014   Procedure: COLONOSCOPY WITH PROPOFOL;  Surgeon: Lollie Sails, MD;  Location: [facility] ENDOSCOPY;  Service: Endoscopy;  Laterality: N/A;  . HERNIA REPAIR      Family History        Family Status  Relation Status  . Mother Deceased at age 81       blood clot  . Father Deceased at age 9  . Son Alive        His family history is not on file.     Allergies  Allergen Reactions  . Sulfa Antibiotics Hives    Increased HR and BP  . Sulfur Rash and Other (See Comments)    Elevated BP and heart rate     Current Outpatient Prescriptions:  .  ALPRAZolam (XANAX) 0.25 MG tablet, TAKE 1 TABLET THREE TIMES A DAY AS NEEDED FOR ANXIETY, Disp: 90 tablet, Rfl: 1 .  lisinopril (PRINIVIL,ZESTRIL) 20 MG tablet, Take 1 tablet (20 mg total) by mouth daily., Disp: 90 tablet, Rfl:  3 .  simvastatin (ZOCOR) 20 MG tablet, TAKE 1 TABLET AT BEDTIME, Disp: 90 tablet, Rfl: 3 .  SUMAtriptan (IMITREX) 100 MG tablet, Take 1 tablet (100 mg total) by mouth every 2 (two) hours as needed for migraine. May repeat in 2 hours if headache persists or recurs., Disp: 15 tablet, Rfl: 3 .  citalopram (CELEXA) 20 MG tablet, Take 1 tablet (20 mg total) by mouth daily. (Patient not taking: Reported on 12/02/2016), Disp: 30 tablet, Rfl: 0   Patient Care Team: Mar Daring, PA-C as PCP - General (Physician Assistant)      Objective:   Vitals: BP 118/70 (BP Location: Left Arm, Patient Position: Sitting, Cuff Size: Large)   Pulse 74   Temp 98.3 F (36.8 C) (Oral)   Resp 16   Ht 5' 8"  (1.727 m)    Wt 212 lb (96.2 kg)   BMI 32.23 kg/m     Physical Exam  Constitutional: He is oriented to person, place, and time. He appears well-developed and well-nourished.  HENT:  Head: Normocephalic and atraumatic.  Right Ear: Hearing, tympanic membrane, external ear and ear canal normal.  Left Ear: Hearing, tympanic membrane, external ear and ear canal normal.  Nose: Nose normal.  Mouth/Throat: Uvula is midline, oropharynx is clear and moist and mucous membranes are normal.  Eyes: Pupils are equal, round, and reactive to light. Conjunctivae and EOM are normal. Right eye exhibits no discharge.  Neck: Normal range of motion. Neck supple. Carotid bruit is not present. No tracheal deviation present. No thyromegaly present.  Cardiovascular: Normal rate, regular rhythm, normal heart sounds and intact distal pulses.   No murmur heard. Pulmonary/Chest: Effort normal and breath sounds normal. No respiratory distress. He has no wheezes. He has no rales. He exhibits no tenderness.  Abdominal: Soft. Bowel sounds are normal. He exhibits no distension and no mass. There is no tenderness. There is no rebound and no guarding.  Genitourinary:  Genitourinary Comments: Deferred to Dr. Jacqlyn Larsen  Musculoskeletal: Normal range of motion. He exhibits no edema or tenderness.  Lymphadenopathy:    He has no cervical adenopathy.  Neurological: He is alert and oriented to person, place, and time. He has normal reflexes. No cranial nerve deficit. He exhibits normal muscle tone. Coordination normal.  Skin: Skin is warm and dry. No rash noted. No erythema.  Psychiatric: He has a normal mood and affect. His behavior is normal. Judgment and thought content normal.     Depression Screen PHQ 2/9 Scores 12/23/2016 12/23/2016  PHQ - 2 Score 0 0  PHQ- 9 Score 1 -      Assessment & Plan:     Routine Health Maintenance and Physical Exam  Exercise Activities and Dietary recommendations Goals    . Exercise 150 minutes per week  (moderate activity)       Immunization History  Administered Date(s) Administered  . Influenza,inj,Quad PF,6+ Mos 01/13/2015    Health Maintenance  Topic Date Due  . HIV Screening  11/05/1982  . INFLUENZA VACCINE  11/13/2016  . TETANUS/TDAP  02/20/2017 (Originally 11/05/1986)     Discussed health benefits of physical activity, and encouraged him to engage in regular exercise appropriate for his age and condition.    1. Annual physical exam Normal physical exam today. Will check labs as below and f/u pending lab results. If labs are stable and WNL he will not need to have these rechecked for one year at his next annual physical exam. He is to call the office  in the meantime if he has any acute issue, questions or concerns. - CBC with Differential/Platelet - Comprehensive metabolic panel - TSH  2. Colon cancer screening Patient has UC, s/p resection with h/o SBO 04/2015, and is to have colonoscopies every 2 years. Colonoscopy now due. Referral placed as below.  - Ambulatory referral to Gastroenterology  3. Benign essential HTN Stable. Continue lisinopril 6m. Will check labs as below and f/u pending results.  4. Ulcerative colitis without complications, unspecified location ([facility] Currently stable. S/P resection.  - Ambulatory referral to Gastroenterology  5. Benign prostatic hyperplasia with urinary obstruction Will check labs as below and f/u pending results. Followed by Dr. CJacqlyn Larsen  - PSA  6. Pure hypercholesterolemia Will check labs as below and f/u pending results. Continue simvastatin 254m - Lipid panel  [facility]. [facility]   8. BMI 32.0-32.9,adult Counseled patient on healthy lifestyle modifications including dieting and exercise.   9. Diabetes mellitus screening Will check labs as below and f/u pending results. - Hemoglobin A1c  10. Prostate cancer screening Will check labs as below and f/u pending results. -  PSA  11. Influenza vaccination declined  --------------------------------------------------------------------    JeMar DaringPA-C  [facility]

## 2016-12-23 NOTE — Patient Instructions (Signed)

## 2017-01-10 ENCOUNTER — Encounter: Payer: Self-pay | Admitting: Physician Assistant

## 2017-01-10 ENCOUNTER — Ambulatory Visit (INDEPENDENT_AMBULATORY_CARE_PROVIDER_SITE_OTHER): Payer: BLUE CROSS/BLUE SHIELD | Admitting: Physician Assistant

## 2017-01-10 VITALS — BP 120/80 | HR 66 | Temp 98.0°F | Resp 16 | Wt 209.0 lb

## 2017-01-10 DIAGNOSIS — S39012A Strain of muscle, fascia and tendon of lower back, initial encounter: Secondary | ICD-10-CM | POA: Diagnosis not present

## 2017-01-10 DIAGNOSIS — E7800 Pure hypercholesterolemia, unspecified: Secondary | ICD-10-CM | POA: Diagnosis not present

## 2017-01-10 DIAGNOSIS — N401 Enlarged prostate with lower urinary tract symptoms: Secondary | ICD-10-CM | POA: Diagnosis not present

## 2017-01-10 DIAGNOSIS — Z125 Encounter for screening for malignant neoplasm of prostate: Secondary | ICD-10-CM | POA: Diagnosis not present

## 2017-01-10 DIAGNOSIS — N138 Other obstructive and reflux uropathy: Secondary | ICD-10-CM | POA: Diagnosis not present

## 2017-01-10 MED ORDER — PREDNISONE 10 MG PO TABS: ORAL_TABLET | ORAL | 0 refills | Status: DC

## 2017-01-10 MED ORDER — CYCLOBENZAPRINE HCL 5 MG PO TABS: 5.0000 mg | ORAL_TABLET | Freq: Three times a day (TID) | ORAL | 0 refills | Status: DC | PRN

## 2017-01-10 NOTE — Progress Notes (Signed)
Patient: Joel Terry Male    DOB: June 13, 1965   51 y.o.   MRN: [id number] Visit Date: 01/10/2017  Today's Provider: Mar Daring, PA-C   Chief Complaint  Patient presents with  . Back Pain   Subjective:    Back Pain  This is a new (Reports that he hurt his back playing golf a month ago) problem. The current episode started more than 1 month ago. The problem occurs constantly. The problem has been waxing and waning (reports it was better with a Aleve but on Saturday he pick up a bucket of water and started to hurt again andit has been constant since.) since onset. The pain is present in the lumbar spine. The quality of the pain is described as aching and stabbing. Radiates to: radiates straight across and down to his legs. The pain is at a severity of 6/10. The pain is moderate. The pain is the same all the time. The symptoms are aggravated by sitting and bending. Stiffness is present: when sitting for a while. Associated symptoms include leg pain and tingling. Pertinent negatives include no abdominal pain, bladder incontinence, bowel incontinence, chest pain, dysuria, fever, headaches, numbness or weakness. He has tried heat (Aleve.took Hydrocodone that he had from a previous surgery. ) for the symptoms. The treatment provided no relief.  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 He reports he has had back issues in the past that required ESI. He has been told his L5 disc is almost non-existent and his L4 disc will bulge occasionally.     Allergies  Allergen Reactions  . Sulfa Antibiotics Hives    Increased HR and BP  . Sulfur Rash and Other (See Comments)    Elevated BP and heart rate     Current Outpatient Prescriptions:  .  ALPRAZolam (XANAX) 0.25 MG tablet, TAKE 1 TABLET THREE TIMES A DAY AS NEEDED FOR ANXIETY, Disp: 90 tablet, Rfl: 1 .  lisinopril (PRINIVIL,ZESTRIL) 20 MG tablet, Take 1 tablet (20 mg total) by mouth daily., Disp: 90 tablet, Rfl: 3 .  simvastatin  (ZOCOR) 20 MG tablet, TAKE 1 TABLET AT BEDTIME, Disp: 90 tablet, Rfl: 3 .  SUMAtriptan (IMITREX) 100 MG tablet, Take 1 tablet (100 mg total) by mouth every 2 (two) hours as needed for migraine. May repeat in 2 hours if headache persists or recurs., Disp: 15 tablet, Rfl: 3 .  citalopram (CELEXA) 20 MG tablet, Take 1 tablet (20 mg total) by mouth daily. (Patient not taking: Reported on 12/02/2016), Disp: 30 tablet, Rfl: 0  Review of Systems  Constitutional: Negative for fever.  Respiratory: Negative.   Cardiovascular: Negative for chest pain.  Gastrointestinal: Negative for abdominal pain and bowel incontinence.  Genitourinary: Negative for bladder incontinence and dysuria.  Musculoskeletal: Positive for back pain and myalgias. Negative for gait problem and joint swelling.  Neurological: Positive for tingling. Negative for weakness, numbness and headaches.    Social History  Substance Use Topics  . Smoking status: Never Smoker  . Smokeless tobacco: Never Used  . Alcohol use Yes     Comment: Occassional   Objective:   BP 120/80 (BP Location: Left Arm, Patient Position: Sitting, Cuff Size: Large)   Pulse 66   Temp 98 F (36.7 C) (Oral)   Resp 16   Wt 209 lb (94.8 kg)   SpO2 98%   BMI 31.78 kg/m     Physical Exam  Constitutional: He appears well-developed and well-nourished. No distress.  HENT:  Head: Normocephalic and atraumatic.  Neck: Normal range of motion. Neck supple.  Cardiovascular: Normal rate, regular rhythm and normal heart sounds.  Exam reveals no gallop and no friction rub.   No murmur heard. Pulmonary/Chest: Effort normal and breath sounds normal. No respiratory distress. He has no wheezes. He has no rales.  Musculoskeletal:       Thoracic back: Normal.       Lumbar back: He exhibits decreased range of motion (forward flexion and lateral flexion to the right), tenderness, pain and spasm. He exhibits no bony tenderness.  Negative SLR bilaterally  Skin: He is not  diaphoretic.  Vitals reviewed.       Assessment & Plan:     1. Strain of lumbar region, initial encounter Will treat with prednisone taper as below and flexeril for 2 weeks. I have also provided him information on low back strain with exercises and stretches. He may apply moist heating pad and do epsom salt soaks in warm water. He is to call if no improvement in 2 weeks and we will get lumbar xray and consider referral to orthopedics vs PT, or both.  - predniSONE (DELTASONE) 10 MG tablet; Take 6 tabs PO on day 1&2, 5 tabs PO on day 3&4, 4 tabs PO on day 5&6, 3 tabs PO on day 7&8, 2 tabs PO on day 9&10, 1 tab PO on day 11&12.  Dispense: 42 tablet; Refill: 0 - cyclobenzaprine (FLEXERIL) 5 MG tablet; Take 1-2 tablets (5-10 mg total) by mouth 3 (three) times daily as needed for muscle spasms.  Dispense: 30 tablet; Refill: 0       Mar Daring, PA-C  [facility]

## 2017-01-10 NOTE — Patient Instructions (Signed)
Low Back Strain A strain is a stretch or tear in a muscle or the strong cords of tissue that attach muscle to bone (tendons). Strains of the lower back (lumbar spine) are a common cause of low back pain. A strain occurs when muscles or tendons are torn or are stretched beyond their limits. The muscles may become inflamed, resulting in pain and sudden muscle tightening (spasms). A strain can happen suddenly due to an injury (trauma), or it can develop gradually due to overuse. There are three types of strains:  Grade 1 is a mild strain involving a minor tear of the muscle fibers or tendons. This may cause some pain but no loss of muscle strength.  Grade 2 is a moderate strain involving a partial tear of the muscle fibers or tendons. This causes more severe pain and some loss of muscle strength.  Grade 3 is a severe strain involving a complete tear of the muscle or tendon. This causes severe pain and complete or nearly complete loss of muscle strength.  What are the causes? This condition may be caused by:  Trauma, such as a fall or a hit to the body.  Twisting or overstretching the back. This may result from doing activities that require a lot of energy, such as lifting heavy objects.  What increases the risk? The following factors may increase your risk of getting this condition:  Playing contact sports.  Participating in sports or activities that put excessive stress on the back and require a lot of bending and twisting, including: ? Lifting weights or heavy objects. ? Gymnastics. ? Soccer. ? Figure skating. ? Snowboarding.  Being overweight or obese.  Having poor strength and flexibility.  What are the signs or symptoms? Symptoms of this condition may include:  Sharp or dull pain in the lower back that does not go away. Pain may extend to the buttocks.  Stiffness.  Limited range of motion.  Inability to stand up straight due to stiffness or pain.  Muscle spasms.  How  is this diagnosed? This condition may be diagnosed based on:  Your symptoms.  Your medical history.  A physical exam. ? Your health care provider may push on certain areas of your back to determine the source of your pain. ? You may be asked to bend forward, backward, and side to side to assess the severity of your pain and your range of motion.  Imaging tests, such as: ? X-rays. ? MRI.  How is this treated? Treatment for this condition may include:  Applying heat and cold to the affected area.  Medicines to help relieve pain and to relax your muscles (muscle relaxants).  NSAIDs to help reduce swelling and discomfort.  Physical therapy.  When your symptoms improve, it is important to gradually return to your normal routine as soon as possible to reduce pain, avoid stiffness, and avoid loss of muscle strength. Generally, symptoms should improve within 6 weeks of treatment. However, recovery time varies. Follow these instructions at home: Managing pain, stiffness, and swelling  If directed, apply ice to the injured area during the first 24 hours after your injury. ? Put ice in a plastic bag. ? Place a towel between your skin and the bag. ? Leave the ice on for 20 minutes, 2-3 times a day.  If directed, apply heat to the affected area as often as told by your health care provider. Use the heat source that your health care provider recommends, such as a moist heat pack  or a heating pad. ? Place a towel between your skin and the heat source. ? Leave the heat on for 20-30 minutes. ? Remove the heat if your skin turns bright red. This is especially important if you are unable to feel pain, heat, or cold. You may have a greater risk of getting burned. Activity  Rest and return to your normal activities as told by your health care provider. Ask your health care provider what activities are safe for you.  Avoid activities that take a lot of effort (are strenuous) for as long as told  by your health care provider.  Do exercises as told by your health care provider. General instructions   Take over-the-counter and prescription medicines only as told by your health care provider.  If you have questions or concerns about safety while taking pain medicine, talk with your health care provider.  Do not drive or operate heavy machinery until you know how your pain medicine affects you.  Do not use any tobacco products, such as cigarettes, chewing tobacco, and e-cigarettes. Tobacco can delay bone healing. If you need help quitting, ask your health care provider.  Keep all follow-up visits as told by your health care provider. This is important. How is this prevented?  Warm up and stretch before being active.  Cool down and stretch after being active.  Give your body time to rest between periods of activity.  Avoid: ? Being physically inactive for long periods at a time. ? Exercising or playing sports when you are tired or in pain.  Use correct form when playing sports and lifting heavy objects.  Use good posture when sitting and standing.  Maintain a healthy weight.  Sleep on a mattress with medium firmness to support your back.  Make sure to use equipment that fits you, including shoes that fit well.  Be safe and responsible while being active to avoid falls.  Do at least 150 minutes of moderate-intensity exercise each week, such as brisk walking or water aerobics. Try a form of exercise that takes stress off your back, such as swimming or stationary cycling.  Maintain physical fitness, including: ? Strength. ? Flexibility. ? Cardiovascular fitness. ? Endurance. Contact a health care provider if:  Your back pain does not improve after 6 weeks of treatment.  Your symptoms get worse. Get help right away if:  Your back pain is severe.  You are unable to stand or walk.  You develop pain in your legs.  You develop weakness in your buttocks or  legs.  You have difficulty controlling when you urinate or when you have a bowel movement. This information is not intended to replace advice given to you by your health care provider. Make sure you discuss any questions you have with your health care provider. Document Released: 04/01/2005 Document Revised: 12/07/2015 Document Reviewed: 01/11/2015 Elsevier Interactive Patient Education  2017 [place] Ask your health care provider which exercises are safe for you. Do exercises exactly as told by your health care provider and adjust them as directed. It is normal to feel mild stretching, pulling, tightness, or discomfort as you do these exercises, but you should stop right away if you feel sudden pain or your pain gets worse. Do not begin these exercises until told by your health care provider. Stretching and range of motion exercises These exercises warm up your muscles and joints and improve the movement and flexibility of your back. These exercises also help  to relieve pain, numbness, and tingling. Exercise A: Lumbar rotation  1. Lie on your back on a firm surface and bend your knees. 2. Straighten your arms out to your sides so each arm forms an "L" shape with a side of your body (a 90 degree angle). 3. Slowly move both of your knees to one side of your body until you feel a stretch in your lower back. Try not to let your shoulders move off of the floor. 4. Hold for __________ seconds. 5. Tense your abdominal muscles and slowly move your knees back to the starting position. 6. Repeat this exercise on the other side of your body. Repeat __________ times. Complete this exercise __________ times a day. Exercise B: Prone extension on elbows  1. Lie on your abdomen on a firm surface. 2. Prop yourself up on your elbows. 3. Use your arms to help lift your chest up until you feel a gentle stretch in your abdomen and your lower back. ? This will place some of your  body weight on your elbows. If this is uncomfortable, try stacking pillows under your chest. ? Your hips should stay down, against the surface that you are lying on. Keep your hip and back muscles relaxed. 4. Hold for __________ seconds. 5. Slowly relax your upper body and return to the starting position. Repeat __________ times. Complete this exercise __________ times a day. Strengthening exercises These exercises build strength and endurance in your back. Endurance is the ability to use your muscles for a long time, even after they get tired. Exercise C: Pelvic tilt 1. Lie on your back on a firm surface. Bend your knees and keep your feet flat. 2. Tense your abdominal muscles. Tip your pelvis up toward the ceiling and flatten your lower back into the floor. ? To help with this exercise, you may place a small towel under your lower back and try to push your back into the towel. 3. Hold for __________ seconds. 4. Let your muscles relax completely before you repeat this exercise. Repeat __________ times. Complete this exercise __________ times a day. Exercise D: Alternating arm and leg raises  1. Get on your hands and knees on a firm surface. If you are on a hard floor, you may want to use padding to cushion your knees, such as an exercise mat. 2. Line up your arms and legs. Your hands should be below your shoulders, and your knees should be below your hips. 3. Lift your left leg behind you. At the same time, raise your right arm and straighten it in front of you. ? Do not lift your leg higher than your hip. ? Do not lift your arm higher than your shoulder. ? Keep your abdominal and back muscles tight. ? Keep your hips facing the ground. ? Do not arch your back. ? Keep your balance carefully, and do not hold your breath. 4. Hold for __________ seconds. 5. Slowly return to the starting position and repeat with your right leg and your left arm. Repeat __________ times. Complete this exercise  __________ times a day. Exercise E: Abdominal set with straight leg raise  1. Lie on your back on a firm surface. 2. Bend one of your knees and keep your other leg straight. 3. Tense your abdominal muscles and lift your straight leg up, 4-6 inches (10-15 cm) off the ground. 4. Keep your abdominal muscles tight and hold for __________ seconds. ? Do not hold your breath. ? Do not arch your  back. Keep it flat against the ground. 5. Keep your abdominal muscles tense as you slowly lower your leg back to the starting position. 6. Repeat with your other leg. Repeat __________ times. Complete this exercise __________ times a day. Posture and body mechanics  Body mechanics refers to the movements and positions of your body while you do your daily activities. Posture is part of body mechanics. Good posture and healthy body mechanics can help to relieve stress in your body's tissues and joints. Good posture means that your spine is in its natural S-curve position (your spine is neutral), your shoulders are pulled back slightly, and your head is not tipped forward. The following are general guidelines for applying improved posture and body mechanics to your everyday activities. Standing   When standing, keep your spine neutral and your feet about hip-width apart. Keep a slight bend in your knees. Your ears, shoulders, and hips should line up.  When you do a task in which you stand in one place for a long time, place one foot up on a stable object that is 2-4 inches (5-10 cm) high, such as a footstool. This helps keep your spine neutral. Sitting   When sitting, keep your spine neutral and keep your feet flat on the floor. Use a footrest, if necessary, and keep your thighs parallel to the floor. Avoid rounding your shoulders, and avoid tilting your head forward.  When working at a desk or a computer, keep your desk at a height where your hands are slightly lower than your elbows. Slide your chair under  your desk so you are close enough to maintain good posture.  When working at a computer, place your monitor at a height where you are looking straight ahead and you do not have to tilt your head forward or downward to look at the screen. Resting   When lying down and resting, avoid positions that are most painful for you.  If you have pain with activities such as sitting, bending, stooping, or squatting (flexion-based activities), lie in a position in which your body does not bend very much. For example, avoid curling up on your side with your arms and knees near your chest (fetal position).  If you have pain with activities such as standing for a long time or reaching with your arms (extension-based activities), lie with your spine in a neutral position and bend your knees slightly. Try the following positions:  Lying on your side with a pillow between your knees.  Lying on your back with a pillow under your knees. Lifting   When lifting objects, keep your feet at least shoulder-width apart and tighten your abdominal muscles.  Bend your knees and hips and keep your spine neutral. It is important to lift using the strength of your legs, not your back. Do not lock your knees straight out.  Always ask for help to lift heavy or awkward objects. This information is not intended to replace advice given to you by your health care provider. Make sure you discuss any questions you have with your health care provider. Document Released: 04/01/2005 Document Revised: 12/07/2015 Document Reviewed: 01/11/2015 Elsevier Interactive Patient Education  [employer].

## 2017-01-11 LAB — CBC WITH DIFFERENTIAL/PLATELET
BASOS ABS: 37 {cells}/uL (ref 0–200)
Basophils Relative: 0.6 %
EOS ABS: 68 {cells}/uL (ref 15–500)
Eosinophils Relative: 1.1 %
HCT: 45.9 % (ref 38.5–50.0)
Hemoglobin: 15.7 g/dL (ref 13.2–17.1)
Lymphs Abs: 1184 cells/uL (ref 850–3900)
MCH: 31.3 pg (ref 27.0–33.0)
MCHC: 34.2 g/dL (ref 32.0–36.0)
MCV: 91.4 fL (ref 80.0–100.0)
MONOS PCT: 9.5 %
MPV: 10.3 fL (ref 7.5–12.5)
Neutro Abs: 4321 cells/uL (ref 1500–7800)
Neutrophils Relative %: 69.7 %
PLATELETS: 385 10*3/uL (ref 140–400)
RBC: 5.02 10*6/uL (ref 4.20–5.80)
RDW: 12.5 % (ref 11.0–15.0)
TOTAL LYMPHOCYTE: 19.1 %
WBC mixed population: 589 cells/uL (ref 200–950)
WBC: 6.2 10*3/uL (ref 3.8–10.8)

## 2017-01-11 LAB — LIPID PANEL
CHOL/HDL RATIO: 4.1 (calc) (ref <5.0)
Cholesterol: 136 mg/dL (ref <200)
HDL: 33 mg/dL — AB (ref >40)
LDL Cholesterol (Calc): 84 mg/dL (calc)
NON-HDL CHOLESTEROL (CALC): 103 mg/dL (ref <130)
TRIGLYCERIDES: 95 mg/dL (ref <150)

## 2017-01-11 LAB — COMPREHENSIVE METABOLIC PANEL
AG Ratio: 1.6 (calc) (ref 1.0–2.5)
ALT: 18 U/L (ref 9–46)
AST: 17 U/L (ref 10–40)
Albumin: 4.2 g/dL (ref 3.6–5.1)
Alkaline phosphatase (APISO): 59 U/L (ref 40–115)
BUN: 21 mg/dL (ref 7–25)
CO2: 28 mmol/L (ref 20–32)
CREATININE: 0.91 mg/dL (ref 0.60–1.35)
Calcium: 9.4 mg/dL (ref 8.6–10.3)
Chloride: 104 mmol/L (ref 98–110)
GLUCOSE: 91 mg/dL (ref 65–99)
Globulin: 2.6 g/dL (calc) (ref 1.9–3.7)
Potassium: 5 mmol/L (ref 3.5–5.3)
Sodium: 139 mmol/L (ref 135–146)
Total Bilirubin: 0.6 mg/dL (ref 0.2–1.2)
Total Protein: 6.8 g/dL (ref 6.1–8.1)

## 2017-01-11 LAB — HEMOGLOBIN A1C
EAG (MMOL/L): 5.5 (calc)
HEMOGLOBIN A1C: 5.1 %{Hb} (ref <5.7)
MEAN PLASMA GLUCOSE: 100 (calc)

## 2017-01-11 LAB — PSA: PSA: 0.7 ng/mL (ref <=4.0)

## 2017-01-11 LAB — TSH: TSH: 0.56 mIU/L (ref 0.40–4.50)

## 2017-01-13 DIAGNOSIS — G8929 Other chronic pain: Secondary | ICD-10-CM | POA: Diagnosis not present

## 2017-01-13 DIAGNOSIS — M545 Low back pain: Secondary | ICD-10-CM | POA: Diagnosis not present

## 2017-02-04 ENCOUNTER — Ambulatory Visit: Payer: BLUE CROSS/BLUE SHIELD | Attending: Sports Medicine

## 2017-02-04 DIAGNOSIS — M545 Low back pain, unspecified: Secondary | ICD-10-CM

## 2017-02-04 DIAGNOSIS — R278 Other lack of coordination: Secondary | ICD-10-CM | POA: Insufficient documentation

## 2017-02-05 NOTE — Therapy (Signed)
[facility] [address] [place], [address] Phone: [phone]   Fax:  [phone]  Physical Therapy Evaluation  Patient Details  Name: Joel Terry MRN: [id number] Date of Birth: 1965/11/10 Referring Provider: Rosalia Hammers  Encounter Date: 02/04/2017      PT End of Session - 02/05/17 1544    Visit Number 1   Number of Visits 13   Date for PT Re-Evaluation 03/18/17   PT Start Time 1645   PT Stop Time 1745   PT Time Calculation (min) 60 min   Activity Tolerance Patient tolerated treatment well   Behavior During Therapy [facility] for tasks assessed/performed      Past Medical History:  Diagnosis Date  . Asthma   . Chronic kidney disease   . Headache   . Hypertension   . Nephrolithiasis   . Obesity   . Ulcerative colitis [facility])     Past Surgical History:  Procedure Laterality Date  . COLECTOMY    . COLONOSCOPY WITH PROPOFOL N/A 09/16/2014   Procedure: COLONOSCOPY WITH PROPOFOL;  Surgeon: Lollie Sails, MD;  Location[facility] ENDOSCOPY;  Service: Endoscopy;  Laterality: N/A;  . HERNIA REPAIR      There were no vitals filed for this visit.       Subjective Assessment - 02/04/17 1701    Subjective Patient is a 51 yo male presenting with increased low back pain. Patient presents with increased back pain with lifting most notably with increased lumbar flexion, and reaching outside base of support, and twisting motion. Patient reports when he has increased pain it lasts "for weeks" patient reports increase in pain which is unresolving. Patient reports increased stiffness along his low back.     Pertinent History 20 years back pain (Every 2-3 years has an episode), PMH of UC, wrist pain   Limitations Lifting   Diagnostic tests X -Ray performed    Patient Stated Goals Decrease pain   Currently in Pain? No/denies   Pain Score --  worst: 4/10   Pain Location Back   Pain Orientation Lower   Pain  Descriptors / Indicators Aching   Pain Type Chronic pain   Pain Onset More than a month ago   Pain Frequency Intermittent            [facility] PT Assessment - 02/04/17 1656      Assessment   Medical Diagnosis low back pain   Referring Provider Rosalia Hammers   Onset Date/Surgical Date 11/13/16   Hand Dominance Right   Next MD Visit unknown   Prior Therapy yes - for LBP     Prior Function   Level of Independence Independent   Vocation Full time employment   Vocation Requirements lifting   Leisure golf, baseball     Cognition   Overall Cognitive Status Within Functional Limits for tasks assessed     Observation/Other Assessments   Observations Decreased glute and mulitfidus activition with hip extension most noably on the R side    Other Surveys  Other Surveys   Modified [place] 26%     Sensation   Light Touch Appears Intact     Functional Tests   Functional tests Squat     Squat   Comments Increased pain with improper squatting     Posture/Postural Control   Posture Comments Decreased lumbar lordosis in standing/sitting     ROM / Strength   AROM / PROM / Strength AROM;Strength  AROM   AROM Assessment Site Hip;Knee;Lumbar   Right/Left Hip --  WNL For B sides   Right/Left Knee --  WNL for B sides   Lumbar Flexion WNL   Lumbar Extension WNL - increased pain   Lumbar - Right Side Bend WNL- increased pain   Lumbar - Left Side Bend WNL   Lumbar - Right Rotation WNL   Lumbar - Left Rotation WNL     Strength   Strength Assessment Site Hip;Lumbar   Right/Left Hip Right;Left   Right Hip Flexion 5/5   Right Hip Extension 4-/5   Right Hip External Rotation  4-/5   Right Hip Internal Rotation 4-/5   Right Hip ABduction 4-/5   Left Hip Flexion 5/5   Left Hip Extension 4-/5   Left Hip External Rotation 4-/5   Left Hip Internal Rotation 4-/5   Left Hip ABduction 4/5   Lumbar Flexion 5/5   Lumbar Extension 4-/5     Palpation   Spinal mobility  Hypomobility S1-L1 centrally and unilaterally on the R side   Palpation comment Increased TTP along multifidus B (more on the R vs L)     Special Tests    Special Tests Lumbar   Lumbar Tests FABER test;Straight Leg Raise     FABER test   findings Negative     Straight Leg Raise   Findings Negative     Objective measurements completed on examination: See above findings.    TREATMENT: Therapeutic Exercise: Hip extension prone -- x 5 B Multifidus crunches in sitting -- x 20 with frequent cueing  Patient demonstrates no increase in pain after exercise performance        PT Education - 02/05/17 1543    Education provided Yes   Education Details HEP: Multifidus crunch hip extension in prone   Person(s) Educated Patient   Methods Explanation;Demonstration   Comprehension Verbalized understanding;Returned demonstration             PT Long Term Goals - 02/05/17 1552      PT LONG TERM GOAL #1   Title Patient will be independent with HEP focused on improving lumbar stabilization to continue benefits of therapy after discharge.   Baseline Dependent with exercise technique   Time 4   Period Weeks   Status New   Target Date 03/04/17     PT LONG TERM GOAL #2   Title Patient will improve MODI to under 10% to demonstrate significant improvement with bending and performing  squatting.   Baseline 26% MODI   Time 6   Period Weeks   Status New   Target Date 03/04/17     PT LONG TERM GOAL #3   Title Patient return will be able to play 18 holes of golf without increase in pain to return to recreational activities.    Baseline unable to play golf   Time 6   Period Weeks   Status New   Target Date 03/04/17                Plan - 02/05/17 1545    Clinical Impression Statement Patient is a 51 yo male right hand dominant presenting with increased lower lumbar pain most notably with lumbar extension. Patient demonstrates increased lumbar dysfunction most likely from  poor motor control with performing hip and lumbar motions. Patient also demonstrates increased lateral hip weakness indicating poor postural control. Patient will benefit from further skilled therapy to return to prior level of function.  History and Personal Factors relevant to plan of care: Colitis, 20 year history of LBP   Clinical Presentation Evolving   Clinical Presentation due to: Patient's pain not improving over past week   Clinical Decision Making Low   Rehab Potential Good   Clinical Impairments Affecting Rehab Potential (+) highly motivated (-) Chronicity of pain   PT Frequency 2x / week   PT Duration 6 weeks   PT Treatment/Interventions Gait training;ADLs/Self Care Home Management;Electrical Stimulation;Ultrasound;Moist Heat;Iontophoresis 29m/ml Dexamethasone;Balance training;Therapeutic exercise;Therapeutic activities;Neuromuscular re-education;Patient/family education;Dry needling;Manual techniques;Passive range of motion   PT Next Visit Plan Progress motor control and strengthening   PT Home Exercise Plan See education section   Consulted and Agree with Plan of Care Patient      Patient will benefit from skilled therapeutic intervention in order to improve the following deficits and impairments:  Pain, Decreased strength, Improper body mechanics, Increased muscle spasms, Hypermobility, Decreased coordination, Impaired UE functional use  Visit Diagnosis: Acute midline low back pain without sciatica - Plan: PT plan of care cert/re-cert  Other lack of coordination - Plan: PT plan of care cert/re-cert     Problem List Patient Active Problem List   Diagnosis Date Noted  . Incomplete emptying of bladder 12/01/2015  . 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  . H/O renal calculi 04/26/2015  . Benign prostatic hyperplasia with urinary obstruction 04/26/2015  . Migraines 01/13/2015  . [place]ative [place] ([place] 01/13/2015  . [place]  06/16/2014  . Allergic rhinitis 12/16/2013  . Benign essential HTN 12/16/2013  . HLD (hyperlipidemia) 12/16/2013    WBlythe Stanford PT DPT 02/05/2017, 4:57 PM  [facility] [address] [place] [address]Phone: [phone]  Fax:  [phone] Name: Joel GREGORIMRN: [id number] of Birth: [birth year]

## 2017-02-06 ENCOUNTER — Ambulatory Visit: Payer: BLUE CROSS/BLUE SHIELD

## 2017-02-06 DIAGNOSIS — R278 Other lack of coordination: Secondary | ICD-10-CM

## 2017-02-06 DIAGNOSIS — M545 Low back pain, unspecified: Secondary | ICD-10-CM

## 2017-02-06 NOTE — Therapy (Signed)
[place] [facility] [address] [place], [address] Phone: [phone]   Fax:  [phone]  Physical Therapy Treatment  Patient Details  Name: Joel Terry MRN: [id number] Date of Birth: December 10, 1965 Referring Provider: Rosalia Hammers  Encounter Date: 02/06/2017      PT End of Session - 02/06/17 1750    Visit Number 2   Number of Visits 13   Date for PT Re-Evaluation 03/18/17   PT Start Time 1700   PT Stop Time 1745   PT Time Calculation (min) 45 min   Activity Tolerance Patient tolerated treatment well   Behavior During Therapy [facility] for tasks assessed/performed      Past Medical History:  Diagnosis Date  . Asthma   . Chronic kidney disease   . Headache   . Hypertension   . Nephrolithiasis   . Obesity   . Ulcerative colitis [facility])     Past Surgical History:  Procedure Laterality Date  . COLECTOMY    . COLONOSCOPY WITH PROPOFOL N/A 09/16/2014   Procedure: COLONOSCOPY WITH PROPOFOL;  Surgeon: Lollie Sails, MD;  Location: [facility] ENDOSCOPY;  Service: Endoscopy;  Laterality: N/A;  . HERNIA REPAIR      There were no vitals filed for this visit.      Subjective Assessment - 02/06/17 1701    Subjective Patient reports his back is a little sore after cutting bricks yesterday. Patient states he has been performing his exercises; and reports light muscle soreness from the exercises.    Pertinent History 20 years back pain (Every 2-3 years has an episode), PMH of UC, wrist pain   Limitations Lifting   Diagnostic tests X -Ray performed    Patient Stated Goals Decrease pain   Currently in Pain? No/denies   Pain Onset More than a month ago      TREATMENT  Manual Therapy:  STM to R multifidus to decrease increased pain and spasms with lumbar mobility with patient positioned in prone. Lumbar mobilizations in prone to improve mobility and decrease increased pain grade II-III; 2 x 30 L3-S1 performed  unilaterally on R and centrally  Therapeutic Exercise: Hip abduction in side lying - 2 x 10 B Hip extension knee bent; straight leg - x10 in each position  Multifidus derotation in standing at [place] - x 20  Cat/camel in quadruped - x 20  Birddog performed in cat position - x 20  Golf swing x 10 for analysis of pain during motion  Patient demonstrates increased lumbar fatigue at end of therapy.        PT Education - 02/06/17 1749    Education provided Yes   Education Details Knee bent hip extension   Person(s) Educated Patient   Methods Explanation;Demonstration   Comprehension Verbalized understanding;Returned demonstration             PT Long Term Goals - 02/05/17 1552      PT LONG TERM GOAL #1   Title Patient will be independent with HEP focused on improving lumbar stabilization to continue benefits of therapy after discharge.   Baseline Dependent with exercise technique   Time 4   Period Weeks   Status New   Target Date 03/04/17     PT LONG TERM GOAL #2   Title Patient will improve MODI to under 10% to demonstrate significant improvement with bending and performing  squatting.   Baseline 26% MODI   Time 6   Period Weeks  Status New   Target Date 03/04/17     PT LONG TERM GOAL #3   Title Patient return will be able to play 18 holes of golf without increase in pain to return to recreational activities.    Baseline unable to play golf   Time 6   Period Weeks   Status New   Target Date 03/04/17               Plan - 02/06/17 1750    Clinical Impression Statement Patient demonstrates increased pain along the R mulitifidus muscle as well as R unilateral facets with manual therapy. Patient demonstrates increased pain with rotational movements and movements requiring increased lumbar lordosis.  Patient also demonstrates increased glute med weakness. Patient will benefit from further skilled therapy focused on improving limitations to return to prior level  of function.    Rehab Potential Good   Clinical Impairments Affecting Rehab Potential (+) highly motivated (-) Chronicity of pain   PT Frequency 2x / week   PT Duration 6 weeks   PT Treatment/Interventions Gait training;ADLs/Self Care Home Management;Electrical Stimulation;Ultrasound;Moist Heat;Iontophoresis 63m/ml Dexamethasone;Balance training;Therapeutic exercise;Therapeutic activities;Neuromuscular re-education;Patient/family education;Dry needling;Manual techniques;Passive range of motion   PT Next Visit Plan Progress motor control and strengthening   PT Home Exercise Plan See education section   Consulted and Agree with Plan of Care Patient      Patient will benefit from skilled therapeutic intervention in order to improve the following deficits and impairments:  Pain, Decreased strength, Improper body mechanics, Increased muscle spasms, Hypermobility, Decreased coordination, Impaired UE functional use  Visit Diagnosis: Acute midline low back pain without sciatica  Other lack of coordination     Problem List Patient Active Problem List   Diagnosis Date Noted  . Incomplete emptying of bladder 12/01/2015  . 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  . H/O renal calculi 04/26/2015  . Benign prostatic hyperplasia with urinary obstruction 04/26/2015  . Migraines 01/13/2015  . [place]ative colitis ([place] 01/13/2015  . [place] of [place] 06/16/2014  . Allergic rhinitis 12/16/2013  . Benign essential HTN 12/16/2013  . HLD (hyperlipidemia) 12/16/2013    WBlythe Stanford PT DPT 02/06/2017, 6:05 PM  [facility] [address] [place] [address]Phone: [phone]  Fax:  [phone] Name: Joel GASPERMRN: [id number] of Birth: 709-28-1967

## 2017-02-10 ENCOUNTER — Ambulatory Visit: Payer: BLUE CROSS/BLUE SHIELD

## 2017-02-10 ENCOUNTER — Telehealth: Payer: Self-pay

## 2017-02-13 ENCOUNTER — Ambulatory Visit: Payer: BLUE CROSS/BLUE SHIELD | Attending: Sports Medicine

## 2017-02-13 DIAGNOSIS — M545 Low back pain, unspecified: Secondary | ICD-10-CM

## 2017-02-13 DIAGNOSIS — R278 Other lack of coordination: Secondary | ICD-10-CM | POA: Insufficient documentation

## 2017-02-13 NOTE — Therapy (Signed)
[place] [facility] [address] [place], [address] Phone: [phone]   Fax:  [phone]  Physical Therapy Treatment  Patient Details  Name: Joel Terry MRN: [id number] Date of Birth: 06-Feb-1966 Referring Provider: Rosalia Hammers  Encounter Date: 02/13/2017      PT End of Session - 02/13/17 1740    Visit Number 3   Number of Visits 13   Date for PT Re-Evaluation 03/18/17   PT Start Time 1700   PT Stop Time 1745   PT Time Calculation (min) 45 min   Activity Tolerance Patient tolerated treatment well   Behavior During Therapy [facility] for tasks assessed/performed      Past Medical History:  Diagnosis Date  . Asthma   . Chronic kidney disease   . Headache   . Hypertension   . Nephrolithiasis   . Obesity   . Ulcerative colitis [facility])     Past Surgical History:  Procedure Laterality Date  . COLECTOMY    . COLONOSCOPY WITH PROPOFOL N/A 09/16/2014   Procedure: COLONOSCOPY WITH PROPOFOL;  Surgeon: Lollie Sails, MD;  [facility] ENDOSCOPY;  Service: Endoscopy;  Laterality: N/A;  . HERNIA REPAIR      There were no vitals filed for this visit.      Subjective Assessment - 02/13/17 1734    Subjective Patient reports increased soreness along his back after the previous treatment but reports "it did not stop him from doing anything.   Pertinent History 20 years back pain (Every 2-3 years has an episode), PMH of UC, wrist pain   Limitations Lifting   Diagnostic tests X -Ray performed    Patient Stated Goals Decrease pain   Currently in Pain? No/denies   Pain Onset More than a month ago      TREATMENT   Manual Therapy:  STM to R multifidus to decrease increased pain and spasms with lumbar mobility with patient positioned in prone. Lumbar mobilizations in prone to improve mobility and decrease increased pain grade II-III; 2 x 30 L3-S1 performed unilaterally on R and centrally   Therapeutic  Exercise: Hip extension straight leg - x10 in each position  Multifidus crunch in sitting - x 20 RTB Lumbar extension (prone press up in prone) - x 30   Patient demonstrates increased lumbar fatigue at end of therapy.       PT Education - 02/13/17 1739    Education provided Yes   Education Details MUltifidus crunch with RTB   Person(s) Educated Patient   Methods Explanation;Demonstration   Comprehension Verbalized understanding;Returned demonstration             PT Long Term Goals - 02/05/17 1552      PT LONG TERM GOAL #1   Title Patient will be independent with HEP focused on improving lumbar stabilization to continue benefits of therapy after discharge.   Baseline Dependent with exercise technique   Time 4   Period Weeks   Status New   Target Date 03/04/17     PT LONG TERM GOAL #2   Title Patient will improve MODI to under 10% to demonstrate significant improvement with bending and performing  squatting.   Baseline 26% MODI   Time 6   Period Weeks   Status New   Target Date 03/04/17     PT LONG TERM GOAL #3   Title Patient return will be able to play 18 holes of golf without increase in pain to return  to recreational activities.    Baseline unable to play golf   Time 6   Period Weeks   Status New   Target Date 03/04/17               Plan - 02/13/17 1745    Clinical Impression Statement Patient demonstrates decreased pain with lumbar extension motions today indicating functional carryover between treatment sessions. Although patient is improving, he continues to demonstrate increased pain with performing lumbar rotational movements such as  swinging a golf club and patient will benefit from further skilled therapy to return to prior level of function.    Rehab Potential Good   Clinical Impairments Affecting Rehab Potential (+) highly motivated (-) Chronicity of pain   PT Frequency 2x / week   PT Duration 6 weeks   PT Treatment/Interventions Gait  training;ADLs/Self Care Home Management;Electrical Stimulation;Ultrasound;Moist Heat;Iontophoresis 64m/ml Dexamethasone;Balance training;Therapeutic exercise;Therapeutic activities;Neuromuscular re-education;Patient/family education;Dry needling;Manual techniques;Passive range of motion   PT Next Visit Plan Progress motor control and strengthening   PT Home Exercise Plan See education section   Consulted and Agree with Plan of Care Patient      Patient will benefit from skilled therapeutic intervention in order to improve the following deficits and impairments:  Pain, Decreased strength, Improper body mechanics, Increased muscle spasms, Hypermobility, Decreased coordination, Impaired UE functional use  Visit Diagnosis: Acute midline low back pain without sciatica  Other lack of coordination     Problem List Patient Active Problem List   Diagnosis Date Noted  . Incomplete emptying of bladder 12/01/2015  . 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  . H/O renal calculi 04/26/2015  . Benign prostatic hyperplasia with urinary obstruction 04/26/2015  . Migraines 01/13/2015  . [place]cerative colitis ([place] 01/13/2015  . Adenoma of pituitary ([facility] 06/16/2014  . Allergic rhinitis 12/16/2013  . Benign essential HTN 12/16/2013  . HLD (hyperlipidemia) 12/16/2013    WBlythe Stanford PT DPT 02/13/2017, 6:27 PM  [facility] [address] [place] [address]Phone: [phone]  Fax:  [phone] Name: Joel CAZEAUMRN: [id number] of Birth: 708-Mar-1967

## 2017-02-17 ENCOUNTER — Ambulatory Visit: Payer: BLUE CROSS/BLUE SHIELD

## 2017-02-17 DIAGNOSIS — M545 Low back pain, unspecified: Secondary | ICD-10-CM

## 2017-02-17 DIAGNOSIS — R278 Other lack of coordination: Secondary | ICD-10-CM | POA: Diagnosis not present

## 2017-02-17 NOTE — Therapy (Signed)
[place] [facility] [address] [place], [address] Phone: [phone]   Fax:  [phone]  Physical Therapy Treatment  Patient Details  Name: Joel Terry MRN: [id number] Date of Birth: 11-13-1965 Referring Provider: Rosalia Hammers   Encounter Date: 02/17/2017  PT End of Session - 02/17/17 1805    Visit Number  4    Number of Visits  13    Date for PT Re-Evaluation  03/18/17    PT Start Time  1720    PT Stop Time  1800    PT Time Calculation (min)  40 min    Activity Tolerance  Patient tolerated treatment well    Behavior During Therapy  [facility] for tasks assessed/performed       Past Medical History:  Diagnosis Date  . Asthma   . Chronic kidney disease   . Headache   . Hypertension   . Nephrolithiasis   . Obesity   . Ulcerative colitis [place])     Past Surgical History:  Procedure Laterality Date  . COLECTOMY    . HERNIA REPAIR      There were no vitals filed for this visit.  Subjective Assessment - 02/17/17 1803    Subjective  Patient reports he felt "the best I ever felt after the previous visit". Patient reports improvement in symptoms and states he's getting better.     Pertinent History  20 years back pain (Every 2-3 years has an episode), PMH of UC, wrist pain    Limitations  Lifting    Diagnostic tests  X -Ray performed     Patient Stated Goals  Decrease pain    Currently in Pain?  No/denies    Pain Onset  More than a month ago       TREATMENT   Manual Therapy: STM to R multifidus to decrease increased pain and spasms with lumbar mobility with patient positioned in prone. Lumbar mobilizations in prone to improve mobility and decrease increased pain grade II-III; 2 x 30 L3-S1 performed unilaterally on R and centrally   Therapeutic Exercise: Hip extension straight leg with bent knee - x10 in each position  Multifidus crunch in sitting - x 20 [place] swing with 1 wood - x 20 at  80% speed  Standing lumbar rotation with weighted cable at [place] - x 20 B 10# Rotational lifts with 10# weight - x 20  Standing hip/lumbar extension in standing - x 20    Patient demonstrates increased lumbar fatigue at end of therapy.   PT Education - 02/17/17 1805    Education provided  Yes    Education Details  Rotational twists in standing with BTB    Person(s) Educated  Patient    Methods  Demonstration;Explanation    Comprehension  Verbalized understanding;Returned demonstration          PT Long Term Goals - 02/05/17 1552      PT LONG TERM GOAL #1   Title  Patient will be independent with HEP focused on improving lumbar stabilization to continue benefits of therapy after discharge.    Baseline  Dependent with exercise technique    Time  4    Period  Weeks    Status  New    Target Date  03/04/17      PT LONG TERM GOAL #2   Title  Patient will improve MODI to under 10% to demonstrate significant improvement with bending and performing  squatting.  Baseline  26% MODI    Time  6    Period  Weeks    Status  New    Target Date  03/04/17      PT LONG TERM GOAL #3   Title  Patient return will be able to play 18 holes of golf without increase in pain to return to recreational activities.     Baseline  unable to play golf    Time  6    Period  Weeks    Status  New    Target Date  03/04/17            Plan - 02/17/17 1807    Clinical Impression Statement  Patient demonstrates decreased pain with performing golf swinging exercise today versus previous visits indicating functional carryover between visits. Patient was able to perform golf swinging without increase in pain. Patient demosntrates increased pain with hip/lumbar extension in standing most likely secondary to decreased muscular coordination. Patient will benefit froom further skilled therapy to return to prior level of function.     Rehab Potential  Good    Clinical Impairments Affecting Rehab  Potential  (+) highly motivated (-) Chronicity of pain    PT Frequency  2x / week    PT Duration  6 weeks    PT Treatment/Interventions  Gait training;ADLs/Self Care Home Management;Electrical Stimulation;Ultrasound;Moist Heat;Iontophoresis 29m/ml Dexamethasone;Balance training;Therapeutic exercise;Therapeutic activities;Neuromuscular re-education;Patient/family education;Dry needling;Manual techniques;Passive range of motion    PT Next Visit Plan  Progress motor control and strengthening    PT Home Exercise Plan  See education section    Consulted and Agree with Plan of Care  Patient       Patient will benefit from skilled therapeutic intervention in order to improve the following deficits and impairments:  Pain, Decreased strength, Improper body mechanics, Increased muscle spasms, Hypermobility, Decreased coordination, Impaired UE functional use  Visit Diagnosis: Acute midline low back pain without sciatica  Other lack of coordination     Problem List Patient Active Problem List   Diagnosis Date Noted  . Incomplete emptying of bladder 12/01/2015  . 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  . H/O renal calculi 04/26/2015  . Benign prostatic hyperplasia with urinary obstruction 04/26/2015  . Migraines 01/13/2015  . Ulcerative colitis ([place] 01/13/2015  . Ad[place]oma of [place] ([facility] [place] 06/16/2014  . Allergic rhinitis 12/16/2013  . Benign essential HTN 12/16/2013  . HLD (hyperlipidemia) 12/16/2013    WBlythe Stanford PT DPT 02/17/2017, 6:10 PM  [facility] [address] [place] [address]Phone: [phone]  Fax:  [phone] Name: AHANSFORD HIRTMRN: [id number] of Birth: 705-Mar-1967

## 2017-02-17 NOTE — Telephone Encounter (Signed)
Attempted call, call failed.

## 2017-02-20 ENCOUNTER — Ambulatory Visit: Payer: BLUE CROSS/BLUE SHIELD

## 2017-02-20 DIAGNOSIS — M545 Low back pain, unspecified: Secondary | ICD-10-CM

## 2017-02-20 DIAGNOSIS — R278 Other lack of coordination: Secondary | ICD-10-CM | POA: Diagnosis not present

## 2017-02-20 NOTE — Therapy (Signed)
[place] [facility] [address], [place], [address] Phone: [phone]   Fax:  [phone]  Physical Therapy Treatment  Patient Details  Name: Joel Terry MRN: [id number] Date of Birth: 04/28/1965 Referring Provider: Rosalia Hammers   Encounter Date: 02/20/2017  PT End of Session - 02/20/17 1645    Visit Number  5    Number of Visits  13    Date for PT Re-Evaluation  03/18/17    PT Start Time  8270    PT Stop Time  1730    PT Time Calculation (min)  47 min    Activity Tolerance  Patient tolerated treatment well    Behavior During Therapy  [facility] for tasks assessed/performed       Past Medical History:  Diagnosis Date  . Asthma   . Chronic kidney disease   . Headache   . Hypertension   . Nephrolithiasis   . Obesity   . Ulcerative colitis [facility])     Past Surgical History:  Procedure Laterality Date  . COLECTOMY    . HERNIA REPAIR      There were no vitals filed for this visit.  Subjective Assessment - 02/20/17 1638    Subjective  Patient reports increased soreness after the previous treatment but reports his back has not been as fatigued when working throughout the day.     Pertinent History  20 years back pain (Every 2-3 years has an episode), PMH of UC, wrist pain    Limitations  Lifting    Diagnostic tests  X -Ray performed     Patient Stated Goals  Decrease pain    Currently in Pain?  No/denies    Pain Onset  More than a month ago       TREATMENT   Manual Therapy: STM to R multifidus to decrease increased pain and spasms with lumbar mobility with patient positioned in prone. Lumbar mobilizations in prone to improve mobility and decrease increased pain grade II-III; 2 x 30 L3-S1 performed unilaterally on R and centrally   Therapeutic Exercise: Single leg Rotational crossbody pulls - x20 5#  Golf Club swing with 1 wood - x 20 at 80% speed with .25# weight at end of club  Standing  hip/lumbar extension in standing - x 20  Multifidus crunch in standing at [place] - x 20 5# Weighted carries - 4 x 45f with 40# KB single arm B Hip hikes off of 3" - x25 B   Patient demonstrates increased lumbar fatigue at end of therapy.   PT Education - 02/20/17 1645    Education provided  Yes    Education Details  Form/technique with exercise    Person(s) Educated  Patient    Methods  Explanation;Demonstration    Comprehension  Verbalized understanding;Returned demonstration          PT Long Term Goals - 02/05/17 1552      PT LONG TERM GOAL #1   Title  Patient will be independent with HEP focused on improving lumbar stabilization to continue benefits of therapy after discharge.    Baseline  Dependent with exercise technique    Time  4    Period  Weeks    Status  New    Target Date  03/04/17      PT LONG TERM GOAL #2   Title  Patient will improve MODI to under 10% to demonstrate significant improvement with bending and performing  squatting.  Baseline  26% MODI    Time  6    Period  Weeks    Status  New    Target Date  03/04/17      PT LONG TERM GOAL #3   Title  Patient return will be able to play 18 holes of golf without increase in pain to return to recreational activities.     Baseline  unable to play golf    Time  6    Period  Weeks    Status  New    Target Date  03/04/17            Plan - 02/20/17 1725    Clinical Impression Statement  Patient demonstrates improvement with exercise performance with ability to perform greater amount of repetitions with exercises. Patient requires cueing to perform exercises in standing(demosntrates decreased posterior pelvic tilt) indicating decreased muscular coordination and will benefit from further skilled therapy to return to prior level of function.     Rehab Potential  Good    Clinical Impairments Affecting Rehab Potential  (+) highly motivated (-) Chronicity of pain    PT Frequency  2x / week    PT Duration  6  weeks    PT Treatment/Interventions  Gait training;ADLs/Self Care Home Management;Electrical Stimulation;Ultrasound;Moist Heat;Iontophoresis 47m/ml Dexamethasone;Balance training;Therapeutic exercise;Therapeutic activities;Neuromuscular re-education;Patient/family education;Dry needling;Manual techniques;Passive range of motion    PT Next Visit Plan  Progress motor control and strengthening    PT Home Exercise Plan  See education section    Consulted and Agree with Plan of Care  Patient       Patient will benefit from skilled therapeutic intervention in order to improve the following deficits and impairments:  Pain, Decreased strength, Improper body mechanics, Increased muscle spasms, Hypermobility, Decreased coordination, Impaired UE functional use  Visit Diagnosis: Other lack of coordination  Acute midline low back pain without sciatica     Problem List Patient Active Problem List   Diagnosis Date Noted  . Incomplete emptying of bladder 12/01/2015  . 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  . H/O renal calculi 04/26/2015  . Benign prostatic hyperplasia with urinary obstruction 04/26/2015  . Migraines 01/13/2015  . [place]cerative colitis ([place] 01/13/2015  . Adenoma of pituitary ([place] 06/16/2014  . Allergic rhinitis 12/16/2013  . Benign essential HTN 12/16/2013  . HLD (hyperlipidemia) 12/16/2013    WBlythe Stanford PT DPT 02/20/2017, 5:33 PM  [facility] [address] [place] [address]Phone: [phone]  Fax:  [phone] Name: Joel REIERSONMRN: [id number] of Birth: 7Sep 24, 1967

## 2017-02-24 ENCOUNTER — Other Ambulatory Visit: Payer: Self-pay

## 2017-02-24 ENCOUNTER — Ambulatory Visit: Payer: BLUE CROSS/BLUE SHIELD

## 2017-02-24 DIAGNOSIS — R278 Other lack of coordination: Secondary | ICD-10-CM | POA: Diagnosis not present

## 2017-02-24 DIAGNOSIS — M545 Low back pain, unspecified: Secondary | ICD-10-CM

## 2017-02-24 NOTE — Therapy (Signed)
[place] [facility] [address], [place], [address] Phone: [phone]   Fax:  [phone]  Physical Therapy Treatment  Patient Details  Name: Joel Terry MRN: [id number] Date of Birth: 16-Nov-1965 Referring Provider: Rosalia Hammers   Encounter Date: 02/24/2017  PT End of Session - 02/24/17 1641    Visit Number  6    Number of Visits  13    Date for PT Re-Evaluation  03/18/17    PT Start Time  0786    PT Stop Time  1725    PT Time Calculation (min)  42 min    Activity Tolerance  Patient tolerated treatment well    Behavior During Therapy  [facility] for tasks assessed/performed       Past Medical History:  Diagnosis Date  . Asthma   . Chronic kidney disease   . Headache   . Hypertension   . Nephrolithiasis   . Obesity   . Ulcerative colitis [facility]     Past Surgical History:  Procedure Laterality Date  . COLECTOMY    . HERNIA REPAIR      There were no vitals filed for this visit.  Subjective Assessment - 02/24/17 1641    Subjective  Pt reports he is doing well today. Denies back pain upon arrival. Believes that he is getting close to ready to return to the golf course.     Pertinent History  20 years back pain (Every 2-3 years has an episode), PMH of UC, wrist pain    Limitations  Lifting    Diagnostic tests  X -Ray performed     Patient Stated Goals  Decrease pain    Currently in Pain?  No/denies         TREATMENT   Manual Therapy:  IASTM to R multifidus to decrease pain and spasms with patient positioned in prone; Lumbar mobilizations in prone to improve mobility and decrease increased pain, grade III; 2 x 30 at each level, L3-L5 CPA and UPA;   Therapeutic Exercise:  Prone hip extension for lumbar activation 2 x 10 bilateral; Bird dog x 15 bilateral; Half-kneeling diagonal crossbody upward diagonal pulls on OMEGA 5# 2 x 15 each direction with Airex pad under knee;  Standing OMEGA diagonal  wood chops simulating golf swing 25# x 15 bilateral;   Pallof press 5# 2 x 15 bilateral; Suitcase carry 40# kettle bell in LUE 2 x 100';                      PT Education - 02/24/17 1641    Education provided  Yes    Education Details  Exercise form/technique, gradual reintroduction to sport    Person(s) Educated  Patient    Methods  Explanation    Comprehension  Verbalized understanding          PT Long Term Goals - 02/05/17 1552      PT LONG TERM GOAL #1   Title  Patient will be independent with HEP focused on improving lumbar stabilization to continue benefits of therapy after discharge.    Baseline  Dependent with exercise technique    Time  4    Period  Weeks    Status  New    Target Date  03/04/17      PT LONG TERM GOAL #2   Title  Patient will improve MODI to under 10% to demonstrate significant improvement with bending and performing  squatting.  Baseline  26% MODI    Time  6    Period  Weeks    Status  New    Target Date  03/04/17      PT LONG TERM GOAL #3   Title  Patient return will be able to play 18 holes of golf without increase in pain to return to recreational activities.     Baseline  unable to play golf    Time  6    Period  Weeks    Status  New    Target Date  03/04/17            Plan - 02/24/17 1641    Clinical Impression Statement  Pt denies back pain with all manual techniques and exercise today. He is able to perform multiple exercises with good core stability. Pt reports that he feels like he is almost ready to return back to playing golf. Encouraged gradual reintroduction starting on the range to assess his tolerance for swinging a golf club. Pt encouraged to continue HEP and follow-up as scheduled.     Rehab Potential  Good    Clinical Impairments Affecting Rehab Potential  (+) highly motivated (-) Chronicity of pain    PT Frequency  2x / week    PT Duration  6 weeks    PT Treatment/Interventions  Gait  training;ADLs/Self Care Home Management;Electrical Stimulation;Ultrasound;Moist Heat;Iontophoresis 42m/ml Dexamethasone;Balance training;Therapeutic exercise;Therapeutic activities;Neuromuscular re-education;Patient/family education;Dry needling;Manual techniques;Passive range of motion    PT Next Visit Plan  Progress motor control and strengthening    PT Home Exercise Plan  See education section    Consulted and Agree with Plan of Care  Patient       Patient will benefit from skilled therapeutic intervention in order to improve the following deficits and impairments:  Pain, Decreased strength, Improper body mechanics, Increased muscle spasms, Hypermobility, Decreased coordination, Impaired UE functional use  Visit Diagnosis: Acute midline low back pain without sciatica     Problem List Patient Active Problem List   Diagnosis Date Noted  . Incomplete emptying of bladder 12/01/2015  . 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  . H/O renal calculi 04/26/2015  . Benign prostatic hyperplasia with urinary obstruction 04/26/2015  . Migraines 01/13/2015  . Ulcerative colitis ([facility] 01/13/2015  . Adenoma of pituitary ([facility] 06/16/2014  . Allergic rhinitis 12/16/2013  . Benign essential HTN 12/16/2013  . HLD (hyperlipidemia) 12/16/2013   JPhillips GroutPT, DPT   Huprich,Jason 02/24/2017, 8:48 PM  [facility] [address] [place] [address]Phone: [phone]  Fax:  [phone] Name: Joel CRUTCHFIELDMRN: [id number] of Birth: 7June 15, 1967

## 2017-02-27 ENCOUNTER — Ambulatory Visit: Payer: BLUE CROSS/BLUE SHIELD

## 2017-02-27 DIAGNOSIS — M545 Low back pain, unspecified: Secondary | ICD-10-CM

## 2017-02-27 DIAGNOSIS — R278 Other lack of coordination: Secondary | ICD-10-CM

## 2017-02-27 NOTE — Therapy (Signed)
[place] [facility] [address], [place], [address] Phone: [phone]   Fax:  [phone]  Physical Therapy Treatment  Patient Details  Name: Joel Terry MRN: [id number] Date of Birth: July 19, 1965 Referring Provider: Rosalia Hammers   Encounter Date: 02/27/2017  PT End of Session - 02/27/17 1723    Visit Number  7    Number of Visits  13    Date for PT Re-Evaluation  03/18/17    PT Start Time  4536    PT Stop Time  1740    PT Time Calculation (min)  45 min    Activity Tolerance  Patient tolerated treatment well    Behavior During Therapy  [facility] for tasks assessed/performed       Past Medical History:  Diagnosis Date  . Asthma   . Chronic kidney disease   . Headache   . Hypertension   . Nephrolithiasis   . Obesity   . Ulcerative colitis [facility])     Past Surgical History:  Procedure Laterality Date  . COLECTOMY    . COLONOSCOPY WITH PROPOFOL N/A 09/16/2014   Procedure: COLONOSCOPY WITH PROPOFOL;  Surgeon: Lollie Sails, MD;  [facility] ENDOSCOPY;  Service: Endoscopy;  Laterality: N/A;  . HERNIA REPAIR      There were no vitals filed for this visit.  Subjective Assessment - 02/27/17 1703    Subjective  Patient reports no increase in pain over the past weken and reports he would like to focus on performing exercises to continue benefits of therapy.     Pertinent History  20 years back pain (Every 2-3 years has an episode), PMH of UC, wrist pain    Limitations  Lifting    Diagnostic tests  X -Ray performed     Patient Stated Goals  Decrease pain    Currently in Pain?  No/denies       TREATMENT Therapeutic Exercise: Walking Lunges - 2 x 10 B Hip half circles in standing - x 20 B Single squats with pushing ball against wall - x 15 B Single leg RDLS with 46# weight - x 20  Side stepping up and over bosu ball with intermittent UE support - x20 Single leg hip/keg extension in standing with B  UE support - x20 B Hip extension at Hip machine - 105# x 20 B Squats on Bosu with finger UE support - x20 Step ups with high knee on Bosu ball - x20  Single leg stance on black side of bosu - x3 30sec Patient demonstrates increased fatigue at end of session.   PT Education - 02/27/17 1719    Education provided  Yes    Education Details  form/technique with exercise    Person(s) Educated  Patient    Methods  Explanation;Demonstration    Comprehension  Verbalized understanding;Returned demonstration          PT Long Term Goals - 02/05/17 1552      PT LONG TERM GOAL #1   Title  Patient will be independent with HEP focused on improving lumbar stabilization to continue benefits of therapy after discharge.    Baseline  Dependent with exercise technique    Time  4    Period  Weeks    Status  New    Target Date  03/04/17      PT LONG TERM GOAL #2   Title  Patient will improve MODI to under 10% to demonstrate significant improvement with  bending and performing  squatting.    Baseline  26% MODI    Time  6    Period  Weeks    Status  New    Target Date  03/04/17      PT LONG TERM GOAL #3   Title  Patient return will be able to play 18 holes of golf without increase in pain to return to recreational activities.     Baseline  unable to play golf    Time  6    Period  Weeks    Status  New    Target Date  03/04/17            Plan - 02/27/17 1726    Clinical Impression Statement  Focused on improving LE, core and lumbar strength with exercises and patient demonstrates no increase in pain during all movements. Patient demonstrates greatest difficulty with performing hip extension exercises indicating strength limitations, although patient continues to have difficulties with hip extension; he demonstrates significant improvement in pain symptoms. Discussed discharge with patient over next visit and patient agrees. Will focus on improving  HEP for discharge next visit.     Rehab  Potential  Good    Clinical Impairments Affecting Rehab Potential  (+) highly motivated (-) Chronicity of pain    PT Frequency  2x / week    PT Duration  6 weeks    PT Treatment/Interventions  Gait training;ADLs/Self Care Home Management;Electrical Stimulation;Ultrasound;Moist Heat;Iontophoresis 19m/ml Dexamethasone;Balance training;Therapeutic exercise;Therapeutic activities;Neuromuscular re-education;Patient/family education;Dry needling;Manual techniques;Passive range of motion    PT Next Visit Plan  Progress motor control and strengthening    PT Home Exercise Plan  See education section    Consulted and Agree with Plan of Care  Patient       Patient will benefit from skilled therapeutic intervention in order to improve the following deficits and impairments:  Pain, Decreased strength, Improper body mechanics, Increased muscle spasms, Hypermobility, Decreased coordination, Impaired UE functional use  Visit Diagnosis: Acute midline low back pain without sciatica  Other lack of coordination     Problem List Patient Active Problem List   Diagnosis Date Noted  . Incomplete emptying of bladder 12/01/2015  . 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  . H/O renal calculi 04/26/2015  . Benign prostatic hyperplasia with urinary obstruction 04/26/2015  . Migraines 01/13/2015  . Ulcerative colitis ([place] 01/13/2015  . Adenoma of pituitary ([place] 06/16/2014  . Allergic rhinitis 12/16/2013  . Benign essential HTN 12/16/2013  . HLD (hyperlipidemia) 12/16/2013    WBlythe Stanford PT DPT 02/27/2017, 5:40 PM  [facility] [address] [place] [address]Phone: [phone]  Fax:  [phone] Name: Joel Terry: [id number] of Birth: [birth year]

## 2017-03-03 ENCOUNTER — Ambulatory Visit: Payer: BLUE CROSS/BLUE SHIELD

## 2017-03-03 DIAGNOSIS — R278 Other lack of coordination: Secondary | ICD-10-CM | POA: Diagnosis not present

## 2017-03-03 DIAGNOSIS — M545 Low back pain, unspecified: Secondary | ICD-10-CM

## 2017-03-03 NOTE — Therapy (Signed)
[place] [facility] [address] [place], [address] Phone: [phone]   Fax:  [phone]  Physical Therapy Treatment/ Discharge Summary  Patient Details  Name: Joel Terry MRN: [id number] Date of Birth: 08-18-1965 Referring Provider: Rosalia Hammers   Encounter Date: 03/03/2017  PT End of Session - 03/03/17 1711    Visit Number  8    Number of Visits  13    Date for PT Re-Evaluation  03/18/17    PT Start Time  1700    PT Stop Time  1730    PT Time Calculation (min)  30 min    Activity Tolerance  Patient tolerated treatment well    Behavior During Therapy  [facility] for tasks assessed/performed       Past Medical History:  Diagnosis Date  . Asthma   . Chronic kidney disease   . Headache   . Hypertension   . Nephrolithiasis   . Obesity   . Ulcerative colitis [facility])     Past Surgical History:  Procedure Laterality Date  . COLECTOMY    . COLONOSCOPY WITH PROPOFOL N/A 09/16/2014   Performed by Lollie Sails, MD at [place]  . HERNIA REPAIR      There were no vitals filed for this visit.  Subjective Assessment - 03/03/17 1709    Subjective  Patient reports his back has been feeling much better and reports a minor muscular soreness from a massage he received a few days prior.     Pertinent History  20 years back pain (Every 2-3 years has an episode), PMH of UC, wrist pain    Limitations  Lifting    Diagnostic tests  X -Ray performed     Patient Stated Goals  Decrease pain    Currently in Pain?  No/denies         TREATMENT Therapeutic Exercise: Walking Lunges -  x 10 B Single leg RDLS with 32# weight - x 20  Squats with holding 10# -- x 20  Standing hip abduction with RTB - x 12 B Standing hip circles - x 10 with RTB Step ups with high knee onto 12 "step - x20 Hip extension in RTB - x 20 B Patient demonstrates increased fatigue at end of session.    PT Education - 03/03/17 1711     Education provided  Yes    Education Details  form/technique with exercise    Person(s) Educated  Patient    Methods  Explanation;Demonstration    Comprehension  Verbalized understanding;Returned demonstration          PT Long Term Goals - 03/03/17 1721      PT LONG TERM GOAL #1   Title  Patient will be independent with HEP focused on improving lumbar stabilization to continue benefits of therapy after discharge.    Baseline  Dependent with exercise technique; 03/03/2017: Independent with HEP performance    Time  4    Period  Weeks    Status  Achieved      PT LONG TERM GOAL #2   Title  Patient will improve MODI to under 10% to demonstrate significant improvement with bending and performing  squatting.    Baseline  26% MODI; 03/03/2017: 8%    Time  6    Period  Weeks    Status  Achieved      PT LONG TERM GOAL #3   Title  Patient return will be able to play  18 holes of golf without increase in pain to return to recreational activities.     Baseline  unable to play golf; 03/03/2017: Has not tested but feels he's be successful with performance    Time  6    Period  Weeks    Status  Achieved            Plan - 03/03/17 1739    Clinical Impression Statement  Patient has met all long term goals and is independent with HEP performance. Patient has a 8% level of dysfunction from the [place] and reports he is back to baseline before onset of increased pain. Patient demonstrates decreased pain with exercise and will benefit from further skilled therapy to return to prior level of function.     Rehab Potential  Good    Clinical Impairments Affecting Rehab Potential  (+) highly motivated (-) Chronicity of pain    PT Frequency  2x / week    PT Duration  6 weeks    PT Treatment/Interventions  Gait training;ADLs/Self Care Home Management;Electrical Stimulation;Ultrasound;Moist Heat;Iontophoresis 31m/ml Dexamethasone;Balance training;Therapeutic exercise;Therapeutic  activities;Neuromuscular re-education;Patient/family education;Dry needling;Manual techniques;Passive range of motion    PT Next Visit Plan  Progress motor control and strengthening    PT Home Exercise Plan  See education section    Consulted and Agree with Plan of Care  Patient       Patient will benefit from skilled therapeutic intervention in order to improve the following deficits and impairments:  Pain, Decreased strength, Improper body mechanics, Increased muscle spasms, Hypermobility, Decreased coordination, Impaired UE functional use  Visit Diagnosis: Acute midline low back pain without sciatica  Other lack of coordination     Problem List Patient Active Problem List   Diagnosis Date Noted  . Incomplete emptying of bladder 12/01/2015  . 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  . H/O renal calculi 04/26/2015  . Benign prostatic hyperplasia with urinary obstruction 04/26/2015  . Migraines 01/13/2015  . Ulcerative colitis ([place] 01/13/2015  . Adenoma of pituitary ([facility] 06/16/2014  . Allergic rhinitis 12/16/2013  . Benign essential HTN 12/16/2013  . HLD (hyperlipidemia) 12/16/2013    WBlythe Terry PT DPT 03/03/2017, 5:42 PM  [facility] [address] [place] [address]Phone: [phone]  Fax:  [phone] Name: Joel MARCHIANOMRN: [id number] of Birth: 710/15/1967

## 2017-04-11 ENCOUNTER — Other Ambulatory Visit: Payer: Self-pay | Admitting: Physician Assistant

## 2017-04-11 DIAGNOSIS — F411 Generalized anxiety disorder: Secondary | ICD-10-CM

## 2017-04-11 NOTE — Telephone Encounter (Signed)
Called in Rx as below.

## 2017-04-13 ENCOUNTER — Other Ambulatory Visit: Payer: Self-pay | Admitting: Physician Assistant

## 2017-04-13 DIAGNOSIS — E785 Hyperlipidemia, unspecified: Secondary | ICD-10-CM

## 2017-05-11 ENCOUNTER — Other Ambulatory Visit: Payer: Self-pay | Admitting: Family Medicine

## 2017-05-11 DIAGNOSIS — I10 Essential (primary) hypertension: Secondary | ICD-10-CM

## 2017-05-12 NOTE — Telephone Encounter (Signed)
Hope you are well.

## 2017-05-14 DIAGNOSIS — Z8719 Personal history of other diseases of the digestive system: Secondary | ICD-10-CM | POA: Diagnosis not present

## 2017-05-15 DIAGNOSIS — Z87442 Personal history of urinary calculi: Secondary | ICD-10-CM | POA: Diagnosis not present

## 2017-05-15 DIAGNOSIS — N138 Other obstructive and reflux uropathy: Secondary | ICD-10-CM | POA: Diagnosis not present

## 2017-05-15 DIAGNOSIS — N401 Enlarged prostate with lower urinary tract symptoms: Secondary | ICD-10-CM | POA: Diagnosis not present

## 2017-05-15 DIAGNOSIS — N529 Male erectile dysfunction, unspecified: Secondary | ICD-10-CM | POA: Diagnosis not present

## 2017-05-15 DIAGNOSIS — R339 Retention of urine, unspecified: Secondary | ICD-10-CM | POA: Diagnosis not present

## 2017-07-11 ENCOUNTER — Encounter: Payer: Self-pay | Admitting: Anesthesiology

## 2017-07-11 ENCOUNTER — Encounter: Payer: Self-pay | Admitting: *Deleted

## 2017-07-11 ENCOUNTER — Ambulatory Visit
Admission: RE | Admit: 2017-07-11 | Discharge: 2017-07-11 | Disposition: A | Discharge status: Home / Self Care | Payer: BLUE CROSS/BLUE SHIELD | Source: Ambulatory Visit | Attending: Gastroenterology | Admitting: Gastroenterology

## 2017-07-11 ENCOUNTER — Encounter
Admission: RE | Disposition: A | Discharge status: Home / Self Care | Payer: Self-pay | Source: Ambulatory Visit | Attending: Gastroenterology

## 2017-07-11 DIAGNOSIS — K91850 Pouchitis: Secondary | ICD-10-CM | POA: Diagnosis not present

## 2017-07-11 DIAGNOSIS — I129 Hypertensive chronic kidney disease with stage 1 through stage 4 chronic kidney disease, or unspecified chronic kidney disease: Secondary | ICD-10-CM | POA: Diagnosis not present

## 2017-07-11 DIAGNOSIS — N189 Chronic kidney disease, unspecified: Secondary | ICD-10-CM | POA: Diagnosis not present

## 2017-07-11 DIAGNOSIS — E669 Obesity, unspecified: Secondary | ICD-10-CM | POA: Insufficient documentation

## 2017-07-11 DIAGNOSIS — Z6832 Body mass index (BMI) 32.0-32.9, adult: Secondary | ICD-10-CM | POA: Diagnosis not present

## 2017-07-11 DIAGNOSIS — Z980 Intestinal bypass and anastomosis status: Secondary | ICD-10-CM | POA: Diagnosis not present

## 2017-07-11 DIAGNOSIS — K621 Rectal polyp: Secondary | ICD-10-CM | POA: Diagnosis not present

## 2017-07-11 DIAGNOSIS — J45909 Unspecified asthma, uncomplicated: Secondary | ICD-10-CM | POA: Diagnosis not present

## 2017-07-11 DIAGNOSIS — D129 Benign neoplasm of anus and anal canal: Secondary | ICD-10-CM | POA: Insufficient documentation

## 2017-07-11 DIAGNOSIS — Z882 Allergy status to sulfonamides status: Secondary | ICD-10-CM | POA: Insufficient documentation

## 2017-07-11 DIAGNOSIS — Z87442 Personal history of urinary calculi: Secondary | ICD-10-CM | POA: Diagnosis not present

## 2017-07-11 DIAGNOSIS — Z8719 Personal history of other diseases of the digestive system: Secondary | ICD-10-CM | POA: Diagnosis not present

## 2017-07-11 DIAGNOSIS — K5190 Ulcerative colitis, unspecified, without complications: Secondary | ICD-10-CM | POA: Diagnosis not present

## 2017-07-11 DIAGNOSIS — Z79899 Other long term (current) drug therapy: Secondary | ICD-10-CM | POA: Diagnosis not present

## 2017-07-11 HISTORY — DX: Personal history of urinary calculi: Z87.442

## 2017-07-11 HISTORY — PX: FLEXIBLE SIGMOIDOSCOPY: SHX5431

## 2017-07-11 SURGERY — SIGMOIDOSCOPY, FLEXIBLE
Anesthesia: Moderate Sedation

## 2017-07-11 MED ORDER — PROMETHAZINE HCL 25 MG/ML IJ SOLN: 12.5000 mg | Freq: Once | INTRAMUSCULAR | Status: DC

## 2017-07-11 MED ORDER — SODIUM CHLORIDE 0.9 % IV SOLN
INTRAVENOUS | Status: DC
  Administered 2017-07-11: 1000 mL via INTRAVENOUS

## 2017-07-11 MED ORDER — SODIUM CHLORIDE 0.9 % IV SOLN: INTRAVENOUS | Status: DC

## 2017-07-11 MED ORDER — MIDAZOLAM HCL 5 MG/5ML IJ SOLN
INTRAMUSCULAR | Status: AC
  Filled 2017-07-11: qty 5

## 2017-07-11 MED ORDER — FENTANYL CITRATE (PF) 100 MCG/2ML IJ SOLN
INTRAMUSCULAR | Status: DC | PRN
  Administered 2017-07-11 (×3): 25 ug via INTRAVENOUS

## 2017-07-11 MED ORDER — FENTANYL CITRATE (PF) 100 MCG/2ML IJ SOLN
INTRAMUSCULAR | Status: AC
  Filled 2017-07-11: qty 2

## 2017-07-11 MED ORDER — MIDAZOLAM HCL 2 MG/2ML IJ SOLN
INTRAMUSCULAR | Status: DC | PRN
  Administered 2017-07-11 (×4): 1 mg via INTRAVENOUS
  Administered 2017-07-11: 2 mg via INTRAVENOUS

## 2017-07-11 NOTE — H&P (Signed)
Outpatient short stay form Pre-procedure 07/11/2017 4:06 PM Lollie Sails MD  Primary Physician: Dr. Fenton Malling  Reason for visit: Flexible sigmoidoscopy  History of present illness: Patient is a 52 year old male with a personal history of ulcerative colitis.  He had a subtotal colectomy with ileal pouch reconstruction in 2007.  His last luminal evaluation was by flexible sigmoidoscopy on 09/16/2014.  Overall he is done well.  He was admitted to the hospital for a small bowel obstruction bout 2 years ago.  He has not had any issues with that since.  X no aspirin or blood thinning agent.    Current Facility-Administered Medications:  .  fentaNYL (SUBLIMAZE) 100 MCG/2ML injection, , , ,  .  midazolam (VERSED) 5 MG/5ML injection, , , ,  .  0.9 %  sodium chloride infusion, , Intravenous, Continuous, Lollie Sails, MD, Last Rate: 20 mL/hr at 07/11/17 1501, 1,000 mL at 07/11/17 1501 .  0.9 %  sodium chloride infusion, , Intravenous, Continuous, Lollie Sails, MD .  promethazine (PHENERGAN) injection 12.5 mg, 12.5 mg, Intravenous, Once, Lollie Sails, MD  Medications Prior to Admission  Medication Sig Dispense Refill Last Dose  . ALPRAZolam (XANAX) 0.25 MG tablet TAKE 1 TABLET THREE TIMES A DAY AS NEEDED FOR ANXIETY 90 tablet 1 Past Week at Unknown time  . citalopram (CELEXA) 20 MG tablet Take 1 tablet (20 mg total) by mouth daily. 30 tablet 0 Past Month at Unknown time  . cyclobenzaprine (FLEXERIL) 5 MG tablet Take 1-2 tablets (5-10 mg total) by mouth 3 (three) times daily as needed for muscle spasms. 30 tablet 0 Past Month at Unknown time  . lisinopril (PRINIVIL,ZESTRIL) 20 MG tablet TAKE 1 TABLET DAILY 90 tablet 3 Past Week at Unknown time  . simvastatin (ZOCOR) 20 MG tablet TAKE 1 TABLET AT BEDTIME 90 tablet 3 Past Week at Unknown time  . SUMAtriptan (IMITREX) 100 MG tablet Take 1 tablet (100 mg total) by mouth every 2 (two) hours as needed for migraine. May repeat in 2  hours if headache persists or recurs. 15 tablet 3 Past Month at Unknown time  . predniSONE (DELTASONE) 10 MG tablet Take 6 tabs PO on day 1&2, 5 tabs PO on day 3&4, 4 tabs PO on day 5&6, 3 tabs PO on day 7&8, 2 tabs PO on day 9&10, 1 tab PO on day 11&12. (Patient not taking: Reported on 02/04/2017) 42 tablet 0 Not Taking at Unknown time     Allergies  Allergen Reactions  . Sulfa Antibiotics Hives    Increased HR and BP  . Sulfur Rash and Other (See Comments)    Elevated BP and heart rate     Past Medical History:  Diagnosis Date  . Asthma   . Chronic kidney disease   . Headache   . History of kidney stones   . Hypertension   . Nephrolithiasis   . Obesity   . Ulcerative colitis ([place])     Review of systems:      Physical Exam    Heart and lungs: Regular rate and rhythm without rub or gallop, lungs are bilaterally clear.    HEENT: Normocephalic atraumatic eyes are anicteric    Other:    Pertinant exam for procedure: Soft nontender nondistended bowel sounds positive normoactive    Planned proceedures: Sedated flexible sigmoidoscopy with indicated procedures. I have discussed the risks benefits and complications of procedures to include not limited to bleeding, infection, perforation and the risk of sedation  and the patient wishes to proceed.    Lollie Sails, MD Gastroenterology 07/11/2017  4:06 PM

## 2017-07-11 NOTE — OR Nursing (Signed)
IV removed from Right Forearm.  Catheter intact.  Site without redness.

## 2017-07-11 NOTE — Op Note (Signed)
[facility] Patient Name: Joel Terry Procedure Date: 07/11/2017 4:07 PM MRN: [id number] Account #: [id number] Date of Birth: [birth year] Admit Type: Outpatient Age: 52 Room: [facility] ENDO ROOM 3 Gender: Male Note Status: Finalized Procedure:            Flexible Sigmoidoscopy Indications:          Personal history of inflammatory bowel disease,                        subtotal colectomy with ileal pouch reconstruction Providers:            Lollie Sails, MD Referring MD:         Mar Daring (Referring MD) Medicines:            Fentanyl 75 micrograms IV, Midazolam 6 mg IV Complications:        No immediate complications. Procedure:            Pre-Anesthesia Assessment:                       - ASA Grade Assessment: II - A patient with mild                        systemic disease.                       After obtaining informed consent, the scope was passed                        under direct vision. The Colonoscope was introduced                        through the anus and advanced to the 14 cm from the                        anal verge. The patient tolerated the procedure well.                        The quality of the bowel preparation was good. Findings:      There is a mild stenosis in the anal canal, this did slowly admit the       tip of the examining finger and the pediatric colonoscopy without       difficulty. The anastomosis was at about 3-4 cm from the apparent anal       verge. There were some small ulcerations associated with probable       stich/staple . The ileal pouch showed one area of irritation at about 12       cm from the anal verge. This was biopsied. The ileal opening is ealily       noted near this with mild irritation at the opening. The pouch above       with appeared non-inflamed, biopsies taken at about 14 cm from the       verge. There was a mild inflammation noted below the anastomosis,       biopsies taken at  about 4-5 cm above the anal verge.      There is a papillary lesion in the mid to distal anal canal, biopsy       taken.      There were no other abnormalities noted. Impression:           -  No specimens collected.                       - Post operative anatomy with some mild pouchitis. Recommendation:       - Discharge patient to home.                       - Await pathology results.                       - Full liquid diet today, then advance as tolerated to                        soft diet for 1 day. Procedure Code(s):    --- Professional ---                       [phone], Sigmoidoscopy, flexible; diagnostic, including                        collection of specimen(s) by brushing or washing, when                        performed (separate procedure) Diagnosis Code(s):    --- Professional ---                       Z87.19, Personal history of other diseases of the                        digestive system CPT copyright 2016 American [employer] Association. All rights reserved. The codes documented in this report are preliminary and upon coder review may  be revised to meet current compliance requirements. Lollie Sails, MD 07/11/2017 4:55:59 PM This report has been signed electronically. Number of Addenda: 0 Note Initiated On: 07/11/2017 4:07 PM Total Procedure Duration: 0 hours 15 minutes 23 seconds       [facility]

## 2017-07-14 ENCOUNTER — Encounter: Payer: Self-pay | Admitting: Gastroenterology

## 2017-07-16 LAB — SURGICAL PATHOLOGY

## 2017-09-17 DIAGNOSIS — Z8719 Personal history of other diseases of the digestive system: Secondary | ICD-10-CM | POA: Diagnosis not present

## 2017-10-23 ENCOUNTER — Ambulatory Visit: Payer: BLUE CROSS/BLUE SHIELD | Admitting: Anesthesiology

## 2017-10-23 ENCOUNTER — Ambulatory Visit
Admission: RE | Admit: 2017-10-23 | Discharge: 2017-10-23 | Disposition: A | Discharge status: Home / Self Care | Payer: BLUE CROSS/BLUE SHIELD | Source: Ambulatory Visit | Attending: Gastroenterology | Admitting: Gastroenterology

## 2017-10-23 ENCOUNTER — Encounter: Payer: Self-pay | Admitting: *Deleted

## 2017-10-23 ENCOUNTER — Other Ambulatory Visit: Payer: Self-pay

## 2017-10-23 ENCOUNTER — Encounter
Admission: RE | Disposition: A | Discharge status: Home / Self Care | Payer: Self-pay | Source: Ambulatory Visit | Attending: Gastroenterology

## 2017-10-23 DIAGNOSIS — K91850 Pouchitis: Secondary | ICD-10-CM | POA: Diagnosis not present

## 2017-10-23 DIAGNOSIS — Z86010 Personal history of colonic polyps: Secondary | ICD-10-CM | POA: Insufficient documentation

## 2017-10-23 DIAGNOSIS — N189 Chronic kidney disease, unspecified: Secondary | ICD-10-CM | POA: Insufficient documentation

## 2017-10-23 DIAGNOSIS — Z8719 Personal history of other diseases of the digestive system: Secondary | ICD-10-CM | POA: Diagnosis not present

## 2017-10-23 DIAGNOSIS — Z79899 Other long term (current) drug therapy: Secondary | ICD-10-CM | POA: Diagnosis not present

## 2017-10-23 DIAGNOSIS — I129 Hypertensive chronic kidney disease with stage 1 through stage 4 chronic kidney disease, or unspecified chronic kidney disease: Secondary | ICD-10-CM | POA: Diagnosis not present

## 2017-10-23 DIAGNOSIS — Z980 Intestinal bypass and anastomosis status: Secondary | ICD-10-CM | POA: Diagnosis not present

## 2017-10-23 DIAGNOSIS — K5190 Ulcerative colitis, unspecified, without complications: Secondary | ICD-10-CM | POA: Diagnosis not present

## 2017-10-23 DIAGNOSIS — J45909 Unspecified asthma, uncomplicated: Secondary | ICD-10-CM | POA: Diagnosis not present

## 2017-10-23 DIAGNOSIS — K219 Gastro-esophageal reflux disease without esophagitis: Secondary | ICD-10-CM | POA: Diagnosis not present

## 2017-10-23 HISTORY — PX: FLEXIBLE SIGMOIDOSCOPY: SHX5431

## 2017-10-23 LAB — HM COLONOSCOPY

## 2017-10-23 SURGERY — SIGMOIDOSCOPY, FLEXIBLE
Anesthesia: General

## 2017-10-23 MED ORDER — LIDOCAINE 2% (20 MG/ML) 5 ML SYRINGE
INTRAMUSCULAR | Status: DC | PRN
  Administered 2017-10-23: 50 mg via INTRAVENOUS

## 2017-10-23 MED ORDER — PROPOFOL 10 MG/ML IV BOLUS
INTRAVENOUS | Status: DC | PRN
  Administered 2017-10-23: 70 mg via INTRAVENOUS

## 2017-10-23 MED ORDER — PHENYLEPHRINE HCL 10 MG/ML IJ SOLN
INTRAMUSCULAR | Status: DC | PRN
  Administered 2017-10-23 (×3): 100 ug via INTRAVENOUS

## 2017-10-23 MED ORDER — PROPOFOL 500 MG/50ML IV EMUL
INTRAVENOUS | Status: AC
  Filled 2017-10-23: qty 50

## 2017-10-23 MED ORDER — SODIUM CHLORIDE 0.9 % IV SOLN
INTRAVENOUS | Status: DC
  Administered 2017-10-23: 13:00:00 via INTRAVENOUS

## 2017-10-23 MED ORDER — PROPOFOL 500 MG/50ML IV EMUL
INTRAVENOUS | Status: DC | PRN
  Administered 2017-10-23: 150 ug/kg/min via INTRAVENOUS

## 2017-10-23 NOTE — Transfer of Care (Signed)
Immediate Anesthesia Transfer of Care Note  Patient: Joel Terry  Procedure(s) Performed: FLEXIBLE SIGMOIDOSCOPY (N/A )  Patient Location: Endoscopy Unit  Anesthesia Type:General  Level of Consciousness: awake, alert  and oriented  Airway & Oxygen Therapy: Patient connected to nasal cannula oxygen  Post-op Assessment: Post -op Vital signs reviewed and stable  Post vital signs: stable  Last Vitals:  Vitals Value Taken Time  BP    Temp    Pulse    Resp    SpO2      Last Pain:  Vitals:   10/23/17 1240  TempSrc: Tympanic  PainSc: 0-No pain         Complications: No apparent anesthesia complications

## 2017-10-23 NOTE — Anesthesia Preprocedure Evaluation (Addendum)
Anesthesia Evaluation  Patient identified by MRN, date of birth, ID band Patient awake    Reviewed: Allergy & Precautions, H&P , NPO status , reviewed documented beta blocker date and time   Airway Mallampati: II  TM Distance: >3 FB Neck ROM: full    Dental  (+) Chipped   Pulmonary asthma ,    Pulmonary exam normal        Cardiovascular hypertension, Normal cardiovascular exam     Neuro/Psych  Headaches,    GI/Hepatic PUD, GERD  Controlled,  Endo/Other    Renal/GU Renal disease     Musculoskeletal   Abdominal   Peds  Hematology   Anesthesia Other Findings Past Medical History: No date: Asthma No date: Chronic kidney disease No date: Headache No date: History of kidney stones No date: Hypertension No date: Nephrolithiasis No date: Obesity No date: Ulcerative colitis ([place])  Past Surgical History: No date: COLECTOMY 09/16/2014: COLONOSCOPY WITH PROPOFOL; N/A     Comment:  Procedure: COLONOSCOPY WITH PROPOFOL;  Surgeon: Lollie Sails, MD;  Location: [facility] ENDOSCOPY;  Service:               Endoscopy;  Laterality: N/A; 07/11/2017: FLEXIBLE SIGMOIDOSCOPY; N/A     Comment:  Procedure: FLEXIBLE SIGMOIDOSCOPY;  Surgeon: Lollie Sails, MD;  Location: [facility] ENDOSCOPY;  Service:               Endoscopy;  Laterality: N/A; No date: HERNIA REPAIR  BMI    Body Mass Index:  33.23 kg/m      Reproductive/Obstetrics                             Anesthesia Physical Anesthesia Plan  ASA: II  Anesthesia Plan: General   Post-op Pain Management:    Induction:   PONV Risk Score and Plan: 2 and Treatment may vary due to age or medical condition and TIVA  Airway Management Planned:   Additional Equipment:   Intra-op Plan:   Post-operative Plan:   Informed Consent: I have reviewed the patients History and Physical, chart, labs and discussed the procedure  including the risks, benefits and alternatives for the proposed anesthesia with the patient or authorized representative who has indicated his/her understanding and acceptance.   Dental Advisory Given  Plan Discussed with: CRNA  Anesthesia Plan Comments:         Anesthesia Quick Evaluation

## 2017-10-23 NOTE — Anesthesia Post-op Follow-up Note (Signed)
Anesthesia QCDR form completed.        

## 2017-10-23 NOTE — H&P (Signed)
Outpatient short stay form Pre-procedure 10/23/2017 1:24 PM Lollie Sails MD  Primary Physician: Fenton Malling, PA  Reason for visit:  flexible sigmoidoscopy  History of present illness: Patient is a 52 year old male presenting today as above.  He has a history of a subtotal near complete colectomy in regards to to ulcerative colitis.  He does have a short cough perhaps 4 to 5 cm of residual colonic tissue.  Toward the base of this there was a atypical frond-like lesion on his last procedure this coming back as a tubular adenoma.  He is presenting today for further evaluation of this lesion and removal if there is any residual.  Tolerated his prep well.  He takes no aspirin or blood thinning agent.    Current Facility-Administered Medications:  .  0.9 %  sodium chloride infusion, , Intravenous, Continuous, Lollie Sails, MD, Last Rate: 20 mL/hr at 10/23/17 1256  Medications Prior to Admission  Medication Sig Dispense Refill Last Dose  . ALPRAZolam (XANAX) 0.25 MG tablet TAKE 1 TABLET THREE TIMES A DAY AS NEEDED FOR ANXIETY 90 tablet 1 Past Week at Unknown time  . lisinopril (PRINIVIL,ZESTRIL) 20 MG tablet TAKE 1 TABLET DAILY 90 tablet 3 10/22/2017 at 2200  . simvastatin (ZOCOR) 20 MG tablet TAKE 1 TABLET AT BEDTIME 90 tablet 3 10/22/2017 at 2200  . SUMAtriptan (IMITREX) 100 MG tablet Take 1 tablet (100 mg total) by mouth every 2 (two) hours as needed for migraine. May repeat in 2 hours if headache persists or recurs. 15 tablet 3 Past Month at Unknown time  . citalopram (CELEXA) 20 MG tablet Take 1 tablet (20 mg total) by mouth daily. (Patient not taking: Reported on 10/23/2017) 30 tablet 0 Completed Course at Unknown time  . cyclobenzaprine (FLEXERIL) 5 MG tablet Take 1-2 tablets (5-10 mg total) by mouth 3 (three) times daily as needed for muscle spasms. (Patient not taking: Reported on 10/23/2017) 30 tablet 0 Completed Course at Unknown time  . predniSONE (DELTASONE) 10 MG tablet Take  6 tabs PO on day 1&2, 5 tabs PO on day 3&4, 4 tabs PO on day 5&6, 3 tabs PO on day 7&8, 2 tabs PO on day 9&10, 1 tab PO on day 11&12. (Patient not taking: Reported on 02/04/2017) 42 tablet 0 Not Taking at Unknown time     Allergies  Allergen Reactions  . Sulfa Antibiotics Hives    Increased HR and BP  . Sulfur Rash and Other (See Comments)    Elevated BP and heart rate     Past Medical History:  Diagnosis Date  . Asthma   . Chronic kidney disease   . Headache   . History of kidney stones   . Hypertension   . Nephrolithiasis   . Obesity   . Ulcerative colitis ([place])     Review of systems:      Physical Exam    Heart and lungs: Regular rate and rhythm without rub or gallop, lungs are bilaterally clear    HEENT: Normocephalic atraumatic eyes are anicteric    Other:    Pertinant exam for procedure: Soft nontender nondistended bowel sounds positive normoactive    Planned proceedures: Dated flexible sigmoidoscopy and indicated procedures    Lollie Sails, MD Gastroenterology 10/23/2017  1:24 PM

## 2017-10-23 NOTE — Op Note (Signed)
[facility] Gastroenterology Patient Name: Joel Terry Procedure Date: 10/23/2017 1:27 PM MRN: [id number] Account #: [id number] Date of Birth: 1965/11/01 Admit Type: Outpatient Age: 52 Room: [facility] ENDO ROOM 2 Gender: Male Note Status: Finalized Procedure:            Flexible Sigmoidoscopy Indications:          Follow-up for history of adenomatous polyps in the colon Providers:            Lollie Sails, MD Referring MD:         Mar Daring (Referring MD) Medicines:            Monitored Anesthesia Care Complications:        No immediate complications. Procedure:            Pre-Anesthesia Assessment:                       - ASA Grade Assessment: II - A patient with mild                        systemic disease.                       After obtaining informed consent, the scope was passed                        under direct vision. The Colonoscope was introduced                        through the anus and advanced to the the ileo-rectal                        anastomosis. The flexible sigmoidoscopy was                        accomplished without difficulty. The patient tolerated                        the procedure well. The quality of the bowel                        preparation was good. Findings:      The digital rectal exam was normal.      The previously noted papillary spot was noted (about 1.2 cm in total       length), with 2 similar smaller polypoid areas to its right, less       papillary in appearance the first location was removed with several       passes of a cold forcep, placed into 3 jars labeled proximal mid and       distal to orient from the anal verge. The other two areas were also       removed with a cold forcep and placed into a separate jar.      The distal pouch was again evaluated with a mild irritation/ileitis       noted, improved in appearance from the last evaluation. Impression:           - Papillary lesion re-evaluated in  the anal canal,                        lesions removed for further evaluation. Recommendation:       -  Clear liquid diet today, then advance as tolerated to                        full liquid diet for 1 day.                       - Return to GI clinic in 3 weeks. Procedure Code(s):    --- Professional ---                       [phone], Sigmoidoscopy, flexible; diagnostic, including                        collection of specimen(s) by brushing or washing, when                        performed (separate procedure) Diagnosis Code(s):    --- Professional ---                       Z86.010, Personal history of colonic polyps CPT copyright 2017 American Medical Association. All rights reserved. The codes documented in this report are preliminary and upon coder review may  be revised to meet current compliance requirements. Lollie Sails, MD 10/23/2017 2:34:04 PM This report has been signed electronically. Number of Addenda: 0 Note Initiated On: 10/23/2017 1:27 PM Total Procedure Duration: 0 hours 33 minutes 44 seconds       [facility]

## 2017-10-26 ENCOUNTER — Encounter: Payer: Self-pay | Admitting: Gastroenterology

## 2017-10-27 ENCOUNTER — Other Ambulatory Visit: Payer: Self-pay | Admitting: Physician Assistant

## 2017-10-27 DIAGNOSIS — F411 Generalized anxiety disorder: Secondary | ICD-10-CM

## 2017-10-27 NOTE — Anesthesia Postprocedure Evaluation (Signed)
Anesthesia Post Note  Patient: Joel Terry  Procedure(s) Performed: FLEXIBLE SIGMOIDOSCOPY (N/A )  Patient location during evaluation: Endoscopy Anesthesia Type: General Level of consciousness: awake and alert Pain management: pain level controlled Vital Signs Assessment: post-procedure vital signs reviewed and stable Respiratory status: spontaneous breathing, nonlabored ventilation and respiratory function stable Cardiovascular status: blood pressure returned to baseline and stable Postop Assessment: no apparent nausea or vomiting Anesthetic complications: no     Last Vitals:  Vitals:   10/23/17 1441 10/23/17 1451  BP: 133/88 (!) 130/100  Pulse: 74 65  Resp: 18   Temp:    SpO2: 99% 99%    Last Pain:  Vitals:   10/23/17 1451  TempSrc:   PainSc: 0-No pain                 Alphonsus Sias

## 2017-11-06 LAB — SURGICAL PATHOLOGY

## 2017-11-24 ENCOUNTER — Encounter: Payer: Self-pay | Admitting: Physician Assistant

## 2018-02-19 ENCOUNTER — Encounter: Payer: Self-pay | Admitting: Physician Assistant

## 2018-02-19 ENCOUNTER — Ambulatory Visit (INDEPENDENT_AMBULATORY_CARE_PROVIDER_SITE_OTHER): Payer: BLUE CROSS/BLUE SHIELD | Admitting: Physician Assistant

## 2018-02-19 VITALS — BP 132/82 | HR 85 | Temp 98.5°F | Wt 229.6 lb

## 2018-02-19 DIAGNOSIS — K5190 Ulcerative colitis, unspecified, without complications: Secondary | ICD-10-CM | POA: Diagnosis not present

## 2018-02-19 DIAGNOSIS — R197 Diarrhea, unspecified: Secondary | ICD-10-CM

## 2018-02-19 NOTE — Progress Notes (Signed)
Patient: Joel Terry Male    DOB: [birth year]   52 y.o.   MRN: [id number] Visit Date: 02/19/2018  Today's Provider: Trinna Post, PA-C   Chief Complaint  Patient presents with  . 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 He reports today with some increased bowel movements. States he normally has bowel movements 6 times per day that are unformed, now going 12 times a day and more unformed. He denies blood or mucous in stool, no out of country travel, hospitalizations or antibiotics. No camping. Reports some mild abdominal pain but no nausea, vomiting, fever, Denies urinary symptoms. He is going out of town and wants to know if he should be on anything for this.    Allergies  Allergen Reactions  . Sulfa Antibiotics Hives    Increased HR and BP  . Sulfur Rash and Other (See Comments)    Elevated BP and heart rate     Current Outpatient Medications:  .  ALPRAZolam (XANAX) 0.25 MG tablet, TAKE 1 TABLET THREE TIMES A DAY AS NEEDED FOR ANXIETY, Disp: 90 tablet, Rfl: 5 .  citalopram (CELEXA) 20 MG tablet, Take 1 tablet (20 mg total) by mouth daily., Disp: 30 tablet, Rfl: 0 .  cyclobenzaprine (FLEXERIL) 5 MG tablet, Take 1-2 tablets (5-10 mg total) by mouth 3 (three) times daily as needed for muscle spasms., Disp: 30 tablet, Rfl: 0 .  lisinopril (PRINIVIL,ZESTRIL) 20 MG tablet, TAKE 1 TABLET DAILY, Disp: 90 tablet, Rfl: 3 .  predniSONE (DELTASONE) 10 MG tablet, Take 6 tabs PO on day 1&2, 5 tabs PO on day 3&4, 4 tabs PO on day 5&6, 3 tabs PO on day 7&8, 2 tabs PO on day 9&10, 1 tab PO on day 11&12., Disp: 42 tablet, Rfl: 0 .  simvastatin (ZOCOR) 20 MG tablet, TAKE 1 TABLET AT BEDTIME, Disp: 90 tablet, Rfl: 3 .  SUMAtriptan (IMITREX) 100 MG tablet, Take 1 tablet  (100 mg total) by mouth every 2 (two) hours as needed for migraine. May repeat in 2 hours if headache persists or recurs., Disp: 15 tablet, Rfl: 3  Review of Systems  Constitutional: Negative.   HENT: Negative.   Respiratory: Negative.   Gastrointestinal: Negative.   Genitourinary: Negative.   Psychiatric/Behavioral: Negative.     Social History   Tobacco Use  . Smoking status: Never Smoker  . Smokeless tobacco: Never Used  Substance Use Topics  . Alcohol use: Yes    Alcohol/week: 3.0 standard drinks    Types: 3 Cans of beer per week    Comment: Occassional   Objective:   BP 132/82 (BP Location: Right Arm, Patient Position: Sitting, Cuff Size: Normal)   Pulse 85   Temp 98.5 F (36.9 C) (Oral)   Wt 229 lb 9.6 oz (104.1 kg)   SpO2 96%   BMI 33.91 kg/m  Vitals:   02/19/18 0942  BP: 132/82  Pulse: 85  Temp: 98.5 F (36.9 C)  TempSrc: Oral  SpO2: 96%  Weight: 229 lb 9.6 oz (104.1 kg)     Physical Exam  Constitutional: He is oriented to person, place, and time. He appears well-developed and well-nourished.  Non-toxic appearance. He does not appear ill.  Cardiovascular: Normal rate, regular rhythm and normal  heart sounds.  Pulmonary/Chest: Effort normal and breath sounds normal.  Abdominal: Soft. Normal appearance and bowel sounds are normal.  Neurological: He is alert and oriented to person, place, and time.  Skin: Skin is warm and dry.  Psychiatric: He has a normal mood and affect. His behavior is normal.        Assessment & Plan:     1. Diarrhea, unspecified type  Has had total colectomy for UC which eliminates risk of diverticulitis and largely C. Diff as well as UC flare. Patient looks overall well in clinic, suspect food intolerance or viral gastroenteritis. Counseled on taking immodium, may call back if not working tomorrow morning can try lomotil.   2. Ulcerative colitis without complications, unspecified location [facility])  Return if symptoms worsen or fail  to improve.  The entirety of the information documented in the History of Present Illness, Review of Systems and Physical Exam were personally obtained by me. Portions of this information were initially documented by Danna Hefty, CMA and reviewed by me for thoroughness and accuracy.             Trinna Post, PA-C  [facility]

## 2018-02-19 NOTE — Patient Instructions (Addendum)
Ulcerative Colitis, Adult Ulcerative colitis is long-lasting (chronic) swelling (inflammation) of the large intestine (colon). Sores (ulcers) may also form on the colon. Ulcerative colitis is closely related to another condition of inflammation of the intestines that is called Crohn disease. Together, they are frequently referred to as inflammatory bowel disease (IBD). What are the causes? Ulcerative colitis is caused by increased activity of the immune system in the intestines. The immune system is the system that protects the body against harmful bacteria, viruses, fungi, and other things that can make you sick. When the immune system overacts, it causes inflammation. The cause of the increased immune system activity is not known. What increases the risk? Risk factors of ulcerative colitis include:  Age. This includes: ? Being 50-17 years old. ? Being older than 50 years old.  Having a family history of ulcerative colitis.  Being of Jewish descent.  What are the signs or symptoms? Common symptoms of ulcerative colitis include rectal bleeding and diarrhea. There is a wide range of symptoms, and a person's symptoms depend on how severe the condition is. Additional symptoms may include:  Pain or cramping in the belly (abdomen).  Fever.  Fatigue.  Weight loss.  Night sweats.  Rectal pain.  Feeling the immediate need to have a bowel movement.  Nausea.  Loss of appetite.  Anemia.  Joint pain or soreness.  Eye irritation.  Certain skin rashes.  How is this diagnosed? Ulcerative colitis may be diagnosed by:  Medical history and physical exam.  Blood tests and stool tests.  X-rays.  CT scans.  Colonoscopy. For this test, a flexible tube is inserted into your anus and your colon is examined.  Examination of a tissue sample from your colon (biopsy).  How is this treated? Treatment for ulcerative colitis may include medicines to:  Decrease  inflammation.  Control your immune system.  Surgery may also be necessary. Follow these instructions at home: Medicines and vitamins  Take medicines only as directed by your doctor. Do not take aspirin.  Ask your doctor if you should take any vitamins or supplements. Lifestyle  Exercise regularly.  Limit alcohol intake to no more than 1 drink per day for nonpregnant women and 2 drinks per day for men. One drink equals 12 ounces of beer, 5 ounces of wine, or 1 ounces of hard liquor. Eating and drinking  Drink enough fluid to keep your urine clear or pale yellow.  Ask your health care provider about the best diet for you. Follow the diet as directed by your health care provider. This may include: ? Avoiding carbonated drinks. ? Avoiding popcorn, vegetable skins, nuts, and other high-fiber foods when you have symptoms of ulcerative colitis. ? Eating smaller meals more often. ? Keeping a food diary. This may help you to find and avoid any foods that make you feel not well.  Limit your caffeine intake. General instructions  Keep all follow-up appointments as directed by your health care provider. This is important. Contact a health care provider if:  Your symptoms do not improve or get worse with treatment.  You continue to lose weight.  You have constant cramps or loose bowels.  You develop a new skin rash, skin sores, or eye problems.  You have a fever or chills. Get help right away if:  You have bloody diarrhea.  You have severe pain in your abdomen.  You vomit. This information is not intended to replace advice given to you by your health care provider. Make sure  you discuss any questions you have with your health care provider. Document Released: 01/09/2005 Document Revised: 12/03/2015 Document Reviewed: 07/25/2014 Elsevier Interactive Patient Education  [employer].

## 2018-02-20 ENCOUNTER — Ambulatory Visit: Payer: Self-pay | Admitting: Physician Assistant

## 2018-04-12 ENCOUNTER — Other Ambulatory Visit: Payer: Self-pay | Admitting: Physician Assistant

## 2018-04-12 DIAGNOSIS — E785 Hyperlipidemia, unspecified: Secondary | ICD-10-CM

## 2018-04-22 DIAGNOSIS — K91850 Pouchitis: Secondary | ICD-10-CM | POA: Diagnosis not present

## 2018-05-07 ENCOUNTER — Other Ambulatory Visit: Payer: Self-pay | Admitting: Physician Assistant

## 2018-05-07 DIAGNOSIS — I10 Essential (primary) hypertension: Secondary | ICD-10-CM

## 2018-06-10 ENCOUNTER — Encounter: Payer: Self-pay | Admitting: Physician Assistant

## 2018-06-10 ENCOUNTER — Ambulatory Visit (INDEPENDENT_AMBULATORY_CARE_PROVIDER_SITE_OTHER): Payer: BLUE CROSS/BLUE SHIELD | Admitting: Physician Assistant

## 2018-06-10 VITALS — BP 135/93 | HR 93 | Temp 99.8°F | Resp 16 | Wt 232.0 lb

## 2018-06-10 DIAGNOSIS — J45909 Unspecified asthma, uncomplicated: Secondary | ICD-10-CM | POA: Insufficient documentation

## 2018-06-10 DIAGNOSIS — J4521 Mild intermittent asthma with (acute) exacerbation: Secondary | ICD-10-CM

## 2018-06-10 DIAGNOSIS — J40 Bronchitis, not specified as acute or chronic: Secondary | ICD-10-CM | POA: Diagnosis not present

## 2018-06-10 MED ORDER — ALBUTEROL SULFATE 108 (90 BASE) MCG/ACT IN AEPB: 1.0000 | INHALATION_SPRAY | Freq: Four times a day (QID) | RESPIRATORY_TRACT | 1 refills | Status: DC | PRN

## 2018-06-10 MED ORDER — HYDROCODONE-HOMATROPINE 5-1.5 MG/5ML PO SYRP: 5.0000 mL | ORAL_SOLUTION | Freq: Three times a day (TID) | ORAL | 0 refills | Status: DC | PRN

## 2018-06-10 MED ORDER — AZITHROMYCIN 250 MG PO TABS: ORAL_TABLET | ORAL | 0 refills | Status: DC

## 2018-06-10 MED ORDER — PREDNISONE 10 MG (21) PO TBPK: ORAL_TABLET | ORAL | 0 refills | Status: DC

## 2018-06-10 NOTE — Patient Instructions (Signed)
Acute Bronchitis, Adult Acute bronchitis is when air tubes (bronchi) in the lungs suddenly get swollen. The condition can make it hard to breathe. It can also cause these symptoms:  A cough.  Coughing up clear, yellow, or green mucus.  Wheezing.  Chest congestion.  Shortness of breath.  A fever.  Body aches.  Chills.  A sore throat. Follow these instructions at home:  Medicines  Take over-the-counter and prescription medicines only as told by your doctor.  If you were prescribed an antibiotic medicine, take it as told by your doctor. Do not stop taking the antibiotic even if you start to feel better. General instructions  Rest.  Drink enough fluids to keep your pee (urine) pale yellow.  Avoid smoking and secondhand smoke. If you smoke and you need help quitting, ask your doctor. Quitting will help your lungs heal faster.  Use an inhaler, cool mist vaporizer, or humidifier as told by your doctor.  Keep all follow-up visits as told by your doctor. This is important. How is this prevented? To lower your risk of getting this condition again:  Wash your hands often with soap and water. If you cannot use soap and water, use hand sanitizer.  Avoid contact with people who have cold symptoms.  Try not to touch your hands to your mouth, nose, or eyes.  Make sure to get the flu shot every year. Contact a doctor if:  Your symptoms do not get better in 2 weeks. Get help right away if:  You cough up blood.  You have chest pain.  You have very bad shortness of breath.  You become dehydrated.  You faint (pass out) or keep feeling like you are going to pass out.  You keep throwing up (vomiting).  You have a very bad headache.  Your fever or chills gets worse. This information is not intended to replace advice given to you by your health care provider. Make sure you discuss any questions you have with your health care provider. Document Released: 09/18/2007 Document  Revised: 11/13/2016 Document Reviewed: 09/20/2015 Elsevier Interactive Patient Education  2019 [employer].  

## 2018-06-10 NOTE — Progress Notes (Signed)
Patient: Joel Terry Male    DOB: 1965-07-21   53 y.o.   MRN: [id number] Visit Date: 06/10/2018  Today's Provider: Mar Daring, PA-C   Chief Complaint  Patient presents with  . URI   Subjective:     URI   This is a new problem. The current episode started 1 to 4 weeks ago (coughing fro 2 weeks. Reports that on Monday night  of this week he cough the whole night and yesterday.). The problem has been gradually worsening. There has been no fever. Associated symptoms include congestion, coughing, a sore throat and wheezing. Pertinent negatives include no chest pain, sinus pain or vomiting. Associated symptoms comments:  Chest feels heavy and fatigue. He has tried decongestant (Mucinex, cough syrup) for the symptoms. The treatment provided no relief.    Allergies  Allergen Reactions  . Sulfa Antibiotics Hives    Increased HR and BP  . Sulfur Rash and Other (See Comments)    Elevated BP and heart rate     Current Outpatient Medications:  .  ALPRAZolam (XANAX) 0.25 MG tablet, TAKE 1 TABLET THREE TIMES A DAY AS NEEDED FOR ANXIETY, Disp: 90 tablet, Rfl: 5 .  lisinopril (PRINIVIL,ZESTRIL) 20 MG tablet, TAKE 1 TABLET DAILY, Disp: 90 tablet, Rfl: 4 .  simvastatin (ZOCOR) 20 MG tablet, Take 1 tablet (20 mg total) by mouth at bedtime. Please schedule office visit before any future refills, Disp: 90 tablet, Rfl: 0 .  Albuterol Sulfate 108 (90 Base) MCG/ACT AEPB, Inhale 1-2 puffs into the lungs every 6 (six) hours as needed., Disp: 1 each, Rfl: 1 .  azithromycin (ZITHROMAX) 250 MG tablet, Take 2 tablets PO on day one, and one tablet PO daily thereafter until completed., Disp: 6 tablet, Rfl: 0 .  citalopram (CELEXA) 20 MG tablet, Take 1 tablet (20 mg total) by mouth daily. (Patient not taking: Reported on 06/10/2018), Disp: 30 tablet, Rfl: 0 .  cyclobenzaprine (FLEXERIL) 5 MG tablet, Take 1-2 tablets (5-10 mg total) by mouth 3 (three) times daily as needed for muscle spasms.  (Patient not taking: Reported on 06/10/2018), Disp: 30 tablet, Rfl: 0 .  HYDROcodone-homatropine (HYCODAN) 5-1.5 MG/5ML syrup, Take 5 mLs by mouth every 8 (eight) hours as needed for cough., Disp: 120 mL, Rfl: 0 .  predniSONE (STERAPRED UNI-PAK 21 TAB) 10 MG (21) TBPK tablet, 6 day taper; take as directed on package instructions, Disp: 21 tablet, Rfl: 0 .  SUMAtriptan (IMITREX) 100 MG tablet, Take 1 tablet (100 mg total) by mouth every 2 (two) hours as needed for migraine. May repeat in 2 hours if headache persists or recurs. (Patient not taking: Reported on 06/10/2018), Disp: 15 tablet, Rfl: 3  Review of Systems  HENT: Positive for congestion, postnasal drip and sore throat. Negative for sinus pressure and sinus pain.   Respiratory: Positive for cough, chest tightness, shortness of breath and wheezing.   Cardiovascular: Negative for chest pain, palpitations and leg swelling.  Gastrointestinal: Negative for vomiting.    Social History   Tobacco Use  . Smoking status: Never Smoker  . Smokeless tobacco: Never Used  Substance Use Topics  . Alcohol use: Yes    Alcohol/week: 3.0 standard drinks    Types: 3 Cans of beer per week    Comment: Occassional      Objective:   BP (!) 135/93 (BP Location: Left Arm, Patient Position: Sitting, Cuff Size: Large)   Pulse 93   Temp 99.8 F (37.7 C) (  Oral)   Resp 16   Wt 232 lb (105.2 kg)   SpO2 96%   BMI 34.26 kg/m  Vitals:   06/10/18 1719  BP: (!) 135/93  Pulse: 93  Resp: 16  Temp: 99.8 F (37.7 C)  TempSrc: Oral  SpO2: 96%  Weight: 232 lb (105.2 kg)     Physical Exam Vitals signs reviewed.  Constitutional:      General: He is not in acute distress.    Appearance: Normal appearance. He is well-developed. He is ill-appearing. He is not diaphoretic.  HENT:     Head: Normocephalic and atraumatic.     Right Ear: Hearing, ear canal and external ear normal. A middle ear effusion is present. Tympanic membrane is not erythematous or  bulging.     Left Ear: Hearing, ear canal and external ear normal. A middle ear effusion is present. Tympanic membrane is not erythematous or bulging.     Nose: Mucosal edema, congestion and rhinorrhea present.     Right Sinus: No maxillary sinus tenderness or frontal sinus tenderness.     Left Sinus: No maxillary sinus tenderness or frontal sinus tenderness.     Mouth/Throat:     Pharynx: Uvula midline. Posterior oropharyngeal erythema present. No oropharyngeal exudate.  Eyes:     General:        Right eye: No discharge.        Left eye: No discharge.     Conjunctiva/sclera: Conjunctivae normal.     Pupils: Pupils are equal, round, and reactive to light.  Neck:     Musculoskeletal: Normal range of motion and neck supple.     Thyroid: No thyromegaly.     Trachea: No tracheal deviation.     Meningeal: Brudzinski's sign and Kernig's sign absent.  Cardiovascular:     Rate and Rhythm: Normal rate and regular rhythm.     Heart sounds: Normal heart sounds. No murmur. No friction rub. No gallop.   Pulmonary:     Effort: Pulmonary effort is normal. No respiratory distress.     Breath sounds: No stridor. Wheezing (expiratory throughout) present. No rales.  Lymphadenopathy:     Cervical: No cervical adenopathy.  Skin:    General: Skin is warm and dry.  Neurological:     Mental Status: He is alert.         Assessment & Plan    1. Bronchitis Worsening. Will treat with zpak, prednisone and Hycodan cough syrup. Push fluids. Rest. Call if worsening.  - azithromycin (ZITHROMAX) 250 MG tablet; Take 2 tablets PO on day one, and one tablet PO daily thereafter until completed.  Dispense: 6 tablet; Refill: 0 - predniSONE (STERAPRED UNI-PAK 21 TAB) 10 MG (21) TBPK tablet; 6 day taper; take as directed on package instructions  Dispense: 21 tablet; Refill: 0 - HYDROcodone-homatropine (HYCODAN) 5-1.5 MG/5ML syrup; Take 5 mLs by mouth every 8 (eight) hours as needed for cough.  Dispense: 120 mL;  Refill: 0  2. Mild intermittent asthma with acute exacerbation See above medical treatment plan. - predniSONE (STERAPRED UNI-PAK 21 TAB) 10 MG (21) TBPK tablet; 6 day taper; take as directed on package instructions  Dispense: 21 tablet; Refill: 0     Mar Daring, PA-C  [facility]

## 2018-06-12 ENCOUNTER — Telehealth: Payer: Self-pay | Admitting: Physician Assistant

## 2018-06-12 DIAGNOSIS — J40 Bronchitis, not specified as acute or chronic: Secondary | ICD-10-CM

## 2018-06-12 MED ORDER — FLUTICASONE FUROATE-VILANTEROL 100-25 MCG/INH IN AEPB: 1.0000 | INHALATION_SPRAY | Freq: Every day | RESPIRATORY_TRACT | 1 refills | Status: DC

## 2018-06-12 NOTE — Telephone Encounter (Signed)
Pt was in Wednesday for bronchitis and was given an inhaler with sulfate in it and he says he is allergic to sulfate and would like the nurse to call him back  His CB # [phone]  Thanks Con Memos

## 2018-06-12 NOTE — Telephone Encounter (Signed)
LMTCB

## 2018-06-12 NOTE — Telephone Encounter (Signed)
Changed to breo

## 2018-06-15 ENCOUNTER — Telehealth: Payer: Self-pay | Admitting: Physician Assistant

## 2018-06-15 NOTE — Telephone Encounter (Signed)
Pt is getting worse since visit.  He is asking what Tawanna Sat would suggest.  Please advise.  Thanks, [employer]

## 2018-06-15 NOTE — Telephone Encounter (Signed)
Tried calling patient. Left message to call back.

## 2018-06-15 NOTE — Telephone Encounter (Signed)
Patient advised. sd 

## 2018-06-15 NOTE — Telephone Encounter (Signed)
Yes recommend to start [facility] please. Call if still no improvements or symptoms worsen.

## 2018-06-15 NOTE — Telephone Encounter (Signed)
Patient reports he having shortness of breath and not being able to get deep breaths. Patient reports he has not started using Breo. Please advise. sd

## 2018-06-16 NOTE — Telephone Encounter (Signed)
Pt calling back needing to know what Tawanna Sat had advised.  Please advise.  Thanks, [employer]

## 2018-06-16 NOTE — Telephone Encounter (Signed)
Patient was advised.  

## 2018-06-19 ENCOUNTER — Telehealth: Payer: Self-pay | Admitting: Physician Assistant

## 2018-06-19 NOTE — Telephone Encounter (Signed)
°  Opened in error [facility]

## 2018-06-20 ENCOUNTER — Encounter: Payer: Self-pay | Admitting: Family Medicine

## 2018-06-20 ENCOUNTER — Ambulatory Visit (INDEPENDENT_AMBULATORY_CARE_PROVIDER_SITE_OTHER): Payer: BLUE CROSS/BLUE SHIELD | Admitting: Family Medicine

## 2018-06-20 VITALS — BP 143/96 | HR 82 | Temp 97.8°F | Wt 234.0 lb

## 2018-06-20 DIAGNOSIS — J40 Bronchitis, not specified as acute or chronic: Secondary | ICD-10-CM

## 2018-06-20 MED ORDER — BENZONATATE 200 MG PO CAPS: 200.0000 mg | ORAL_CAPSULE | Freq: Two times a day (BID) | ORAL | 0 refills | Status: DC | PRN

## 2018-06-20 MED ORDER — CEFDINIR 300 MG PO CAPS: 300.0000 mg | ORAL_CAPSULE | Freq: Two times a day (BID) | ORAL | 0 refills | Status: DC

## 2018-06-20 NOTE — Progress Notes (Signed)
Patient: Joel Terry Male    DOB: [birth year]   53 y.o.   MRN: [id number] Visit Date: 06/20/2018  Today's Provider: Vernie Murders, PA   Chief Complaint  Patient presents with  . Cough   Subjective:     Cough  This is a new problem. The current episode started 1 to 4 weeks ago. The problem has been unchanged. Associated symptoms include shortness of breath. Associated symptoms comments: Congestion . Treatments tried: Z-pak, Hycodan, Breo and Prednisone. The treatment provided mild relief. His past medical history is significant for bronchitis.   Past Medical History:  Diagnosis Date  . Asthma   . Chronic kidney disease   . Headache   . History of kidney stones   . Hypertension   . Nephrolithiasis   . Obesity   . Ulcerative colitis [facility])    Past Surgical History:  Procedure Laterality Date  . COLECTOMY    . COLONOSCOPY WITH PROPOFOL N/A 09/16/2014   Procedure: COLONOSCOPY WITH PROPOFOL;  Surgeon: Lollie Sails, MD;  Location: [facility] ENDOSCOPY;  Service: Endoscopy;  Laterality: N/A;  . FLEXIBLE SIGMOIDOSCOPY N/A 07/11/2017   Procedure: FLEXIBLE SIGMOIDOSCOPY;  Surgeon: Lollie Sails, MD;  Location: [facility] ENDOSCOPY;  Service: Endoscopy;  Laterality: N/A;  . FLEXIBLE SIGMOIDOSCOPY N/A 10/23/2017   Procedure: FLEXIBLE SIGMOIDOSCOPY;  Surgeon: Lollie Sails, MD;  Location: [facility] ENDOSCOPY;  Service: Endoscopy;  Laterality: N/A;  . HERNIA REPAIR     History reviewed. No pertinent family history.  Allergies  Allergen Reactions  . Sulfa Antibiotics Hives    Increased HR and BP  . Sulfur Rash and Other (See Comments)    Elevated BP and heart rate    Current Outpatient Medications:  .  ALPRAZolam (XANAX) 0.25 MG tablet, TAKE 1 TABLET THREE TIMES A DAY AS NEEDED FOR ANXIETY, Disp: 90 tablet, Rfl: 5 .  fluticasone furoate-vilanterol (BREO ELLIPTA) 100-25 MCG/INH AEPB, Inhale 1 puff into the lungs daily., Disp: 60 each, Rfl: 1 .  lisinopril (PRINIVIL,ZESTRIL) 20  MG tablet, TAKE 1 TABLET DAILY, Disp: 90 tablet, Rfl: 4 .  simvastatin (ZOCOR) 20 MG tablet, Take 1 tablet (20 mg total) by mouth at bedtime. Please schedule office visit before any future refills, Disp: 90 tablet, Rfl: 0 .  citalopram (CELEXA) 20 MG tablet, Take 1 tablet (20 mg total) by mouth daily. (Patient not taking: Reported on 06/10/2018), Disp: 30 tablet, Rfl: 0 .  cyclobenzaprine (FLEXERIL) 5 MG tablet, Take 1-2 tablets (5-10 mg total) by mouth 3 (three) times daily as needed for muscle spasms. (Patient not taking: Reported on 06/10/2018), Disp: 30 tablet, Rfl: 0 .  SUMAtriptan (IMITREX) 100 MG tablet, Take 1 tablet (100 mg total) by mouth every 2 (two) hours as needed for migraine. May repeat in 2 hours if headache persists or recurs. (Patient not taking: Reported on 06/10/2018), Disp: 15 tablet, Rfl: 3  Review of Systems  Constitutional: Negative.   HENT: Positive for congestion.   Respiratory: Positive for cough and shortness of breath.   Cardiovascular: Negative.   Musculoskeletal: Negative.    Social History   Tobacco Use  . Smoking status: Never Smoker  . Smokeless tobacco: Never Used  Substance Use Topics  . Alcohol use: Yes    Alcohol/week: 3.0 standard drinks    Types: 3 Cans of beer per week    Comment: Occassional     Objective:   BP (!) 143/96 (BP Location: Right Arm, Patient Position: Sitting, Cuff Size: Large)  Pulse 82   Temp 97.8 F (36.6 C) (Oral)   Wt 234 lb (106.1 kg)   SpO2 98%   BMI 34.56 kg/m  Vitals:   06/20/18 0905  BP: (!) 143/96  Pulse: 82  Temp: 97.8 F (36.6 C)  TempSrc: Oral  SpO2: 98%  Weight: 234 lb (106.1 kg)   Physical Exam Constitutional:      General: He is not in acute distress.    Appearance: He is well-developed.  HENT:     Head: Normocephalic and atraumatic.     Right Ear: Hearing and tympanic membrane normal.     Left Ear: Hearing and tympanic membrane normal.     Nose: Nose normal.     Mouth/Throat:     Pharynx:  Oropharynx is clear.  Eyes:     General: Lids are normal. No scleral icterus.       Right eye: No discharge.        Left eye: No discharge.     Conjunctiva/sclera: Conjunctivae normal.  Neck:     Musculoskeletal: Neck supple.  Cardiovascular:     Rate and Rhythm: Normal rate and regular rhythm.     Heart sounds: Normal heart sounds.  Pulmonary:     Effort: Pulmonary effort is normal. No respiratory distress.  Abdominal:     General: Bowel sounds are normal.     Palpations: Abdomen is soft.  Musculoskeletal: Normal range of motion.  Lymphadenopathy:     Cervical: No cervical adenopathy.  Skin:    Findings: No lesion or rash.  Neurological:     Mental Status: He is alert and oriented to person, place, and time.  Psychiatric:        Speech: Speech normal.        Behavior: Behavior normal.        Thought Content: Thought content normal.       Assessment & Plan    1. Bronchitis Still has a hacking cough but very little sputum occasionally. Denies wheezing. Some scratchy throat and questionable PND. No rales or rhonchi noted today. Voice is weak (laryngitis) and no fever. Continue Albuterol prn wheeze. Take Breo regularly and may add Mucinex-DM. Given Omnicef for 10 day and add Tessalon Perles for cough. Increase fluid intake and home to rest for the weekend. Out of work 06-16-18 through 06-21-18. Recheck if no better in 4-5 days to get labs and CXR. - cefdinir (OMNICEF) 300 MG capsule; Take 1 capsule (300 mg total) by mouth 2 (two) times daily.  Dispense: 20 capsule; Refill: 0 - benzonatate (TESSALON) 200 MG capsule; Take 1 capsule (200 mg total) by mouth 2 (two) times daily as needed for cough.  Dispense: 20 capsule; Refill: [place], PA  [facility]

## 2018-07-12 ENCOUNTER — Other Ambulatory Visit: Payer: Self-pay | Admitting: Family Medicine

## 2018-07-12 DIAGNOSIS — E785 Hyperlipidemia, unspecified: Secondary | ICD-10-CM

## 2018-11-10 ENCOUNTER — Telehealth: Payer: Self-pay | Admitting: Physician Assistant

## 2018-11-10 DIAGNOSIS — Z789 Other specified health status: Secondary | ICD-10-CM

## 2018-11-10 NOTE — Telephone Encounter (Signed)
Please advise 

## 2018-11-10 NOTE — Telephone Encounter (Signed)
LMTCB

## 2018-11-10 NOTE — Telephone Encounter (Signed)
Patient advised as directed below. 

## 2018-11-10 NOTE — Telephone Encounter (Signed)
Pt's employer asking pt to have a COVID test done before he returns to work.   Pt went to [place], [place] last week.  Please advise pt.  Thanks,  [employer]

## 2018-11-10 NOTE — Telephone Encounter (Signed)
Covid test will be ordered but he still has to quarantine until results are received.

## 2018-11-11 ENCOUNTER — Other Ambulatory Visit: Payer: Self-pay

## 2018-11-11 DIAGNOSIS — R6889 Other general symptoms and signs: Secondary | ICD-10-CM | POA: Diagnosis not present

## 2018-11-11 DIAGNOSIS — Z20822 Contact with and (suspected) exposure to covid-19: Secondary | ICD-10-CM

## 2018-11-13 ENCOUNTER — Telehealth: Payer: Self-pay

## 2018-11-13 LAB — NOVEL CORONAVIRUS, NAA: SARS-CoV-2, NAA: NOT DETECTED

## 2018-11-13 NOTE — Telephone Encounter (Signed)
Patient advised as directed below. 

## 2018-11-13 NOTE — Telephone Encounter (Signed)
-----   Message from Mar Daring, PA-C sent at 11/13/2018  8:29 AM EDT ----- covid negative.

## 2019-01-29 ENCOUNTER — Telehealth: Payer: Self-pay

## 2019-01-29 DIAGNOSIS — S39012A Strain of muscle, fascia and tendon of lower back, initial encounter: Secondary | ICD-10-CM

## 2019-01-29 MED ORDER — CYCLOBENZAPRINE HCL 5 MG PO TABS: 5.0000 mg | ORAL_TABLET | Freq: Three times a day (TID) | ORAL | 0 refills | Status: DC | PRN

## 2019-01-29 MED ORDER — METHYLPREDNISOLONE 4 MG PO TBPK: ORAL_TABLET | ORAL | 0 refills | Status: DC

## 2019-01-29 NOTE — Telephone Encounter (Signed)
Letter printed.

## 2019-01-29 NOTE — Telephone Encounter (Signed)
Please advise 

## 2019-01-29 NOTE — Telephone Encounter (Signed)
Patient was advised? Patient is requesting a note for work, where he was out for the past 3 days for back pain. Please advise note?

## 2019-01-29 NOTE — Telephone Encounter (Signed)
Patient states he threw his back out about a week ago.  Having pain radiate down leg and causing some tingling.  Wanted to be seen but no appointments available.  Would like call back to discuss possible treatment.  CB# [phone]

## 2019-01-29 NOTE — Telephone Encounter (Signed)
Patient was called and informed that his note has been printed and is ready for pick up. He is also signing up for his mychart for [place] to send it to him through that to save him a trip to the office.

## 2019-01-29 NOTE — Telephone Encounter (Signed)
Medrol and flexeril sent to [facility]. Heating pad and light stretches.

## 2019-02-08 ENCOUNTER — Telehealth: Payer: Self-pay | Admitting: Physician Assistant

## 2019-02-08 NOTE — Telephone Encounter (Signed)
Pt returning call

## 2019-02-08 NOTE — Telephone Encounter (Signed)
Patient advised as below.  

## 2019-02-08 NOTE — Telephone Encounter (Signed)
lmtcb

## 2019-02-08 NOTE — Telephone Encounter (Signed)
Pt called saying his back is better but he is still having pain in the back of his leg.  Please advise  [phone]  Con Memos

## 2019-02-08 NOTE — Telephone Encounter (Signed)
It should hopefully continue to improve as well. May use OTC NSAIDs like IBU (can take 64m TID) or Naproxen (4455mBID). Try not to use for more than 7 days due to his GI history.   He could also try topical treatment like icyhot or biofreeze on the area of leg that is bothering him.   Heating pad. Light stretches for back and hamstrings and quadriceps can also help.

## 2019-02-10 ENCOUNTER — Telehealth: Payer: Self-pay | Admitting: Physician Assistant

## 2019-02-10 NOTE — Telephone Encounter (Signed)
Corrected work note

## 2019-05-07 DIAGNOSIS — N529 Male erectile dysfunction, unspecified: Secondary | ICD-10-CM | POA: Diagnosis not present

## 2019-05-07 DIAGNOSIS — N401 Enlarged prostate with lower urinary tract symptoms: Secondary | ICD-10-CM | POA: Diagnosis not present

## 2019-05-07 DIAGNOSIS — N138 Other obstructive and reflux uropathy: Secondary | ICD-10-CM | POA: Diagnosis not present

## 2019-06-02 ENCOUNTER — Ambulatory Visit
Admission: RE | Admit: 2019-06-02 | Discharge: 2019-06-02 | Disposition: A | Discharge status: Home / Self Care | Payer: BC Managed Care – PPO | Source: Ambulatory Visit | Attending: Physician Assistant | Admitting: Physician Assistant

## 2019-06-02 ENCOUNTER — Ambulatory Visit (INDEPENDENT_AMBULATORY_CARE_PROVIDER_SITE_OTHER): Payer: BC Managed Care – PPO | Admitting: Physician Assistant

## 2019-06-02 ENCOUNTER — Other Ambulatory Visit: Payer: Self-pay

## 2019-06-02 ENCOUNTER — Other Ambulatory Visit
Admission: RE | Admit: 2019-06-02 | Discharge: 2019-06-02 | Disposition: A | Discharge status: Home / Self Care | Payer: BC Managed Care – PPO | Source: Home / Self Care | Attending: Physician Assistant | Admitting: Physician Assistant

## 2019-06-02 ENCOUNTER — Telehealth: Payer: Self-pay | Admitting: Physician Assistant

## 2019-06-02 ENCOUNTER — Encounter: Payer: Self-pay | Admitting: Physician Assistant

## 2019-06-02 VITALS — BP 122/82 | HR 79 | Temp 96.8°F | Wt 239.6 lb

## 2019-06-02 DIAGNOSIS — R110 Nausea: Secondary | ICD-10-CM

## 2019-06-02 DIAGNOSIS — R1011 Right upper quadrant pain: Secondary | ICD-10-CM

## 2019-06-02 DIAGNOSIS — R109 Unspecified abdominal pain: Secondary | ICD-10-CM | POA: Diagnosis not present

## 2019-06-02 LAB — COMPREHENSIVE METABOLIC PANEL
ALT: 25 U/L (ref 0–44)
AST: 24 U/L (ref 15–41)
Albumin: 4.2 g/dL (ref 3.5–5.0)
Alkaline Phosphatase: 65 U/L (ref 38–126)
Anion gap: 7 (ref 5–15)
BUN: 20 mg/dL (ref 6–20)
CO2: 26 mmol/L (ref 22–32)
Calcium: 8.9 mg/dL (ref 8.9–10.3)
Chloride: 102 mmol/L (ref 98–111)
Creatinine, Ser: 1.19 mg/dL (ref 0.61–1.24)
GFR calc Af Amer: >60 mL/min (ref >60)
GFR calc non Af Amer: >60 mL/min (ref >60)
Glucose, Bld: 95 mg/dL (ref 70–99)
Potassium: 4.3 mmol/L (ref 3.5–5.1)
Sodium: 135 mmol/L (ref 135–145)
Total Bilirubin: 0.9 mg/dL (ref 0.3–1.2)
Total Protein: 7.5 g/dL (ref 6.5–8.1)

## 2019-06-02 LAB — CBC WITH DIFFERENTIAL/PLATELET
Abs Immature Granulocytes: 0.01 10*3/uL (ref 0.00–0.07)
Basophils Absolute: 0 10*3/uL (ref 0.0–0.1)
Basophils Relative: 1 %
Eosinophils Absolute: 0.1 10*3/uL (ref 0.0–0.5)
Eosinophils Relative: 1 %
HCT: 47.3 % (ref 39.0–52.0)
Hemoglobin: 16.5 g/dL (ref 13.0–17.0)
Immature Granulocytes: 0 %
Lymphocytes Relative: 16 %
Lymphs Abs: 1.2 10*3/uL (ref 0.7–4.0)
MCH: 31.1 pg (ref 26.0–34.0)
MCHC: 34.9 g/dL (ref 30.0–36.0)
MCV: 89.1 fL (ref 80.0–100.0)
Monocytes Absolute: 0.6 10*3/uL (ref 0.1–1.0)
Monocytes Relative: 8 %
Neutro Abs: 5.5 10*3/uL (ref 1.7–7.7)
Neutrophils Relative %: 74 %
Platelets: 373 10*3/uL (ref 150–400)
RBC: 5.31 MIL/uL (ref 4.22–5.81)
RDW: 13.2 % (ref 11.5–15.5)
WBC: 7.5 10*3/uL (ref 4.0–10.5)
nRBC: 0 % (ref 0.0–0.2)

## 2019-06-02 LAB — LIPASE, BLOOD: Lipase: 23 U/L (ref 11–51)

## 2019-06-02 LAB — AMYLASE: Amylase: 51 U/L (ref 28–100)

## 2019-06-02 MED ORDER — PROMETHAZINE HCL 12.5 MG PO TABS: 12.5000 mg | ORAL_TABLET | Freq: Three times a day (TID) | ORAL | 0 refills | Status: DC | PRN

## 2019-06-02 MED ORDER — IOHEXOL 300 MG/ML  SOLN
100.0000 mL | Freq: Once | INTRAMUSCULAR | Status: AC | PRN
  Administered 2019-06-02: 18:00:00 100 mL via INTRAVENOUS

## 2019-06-02 NOTE — Progress Notes (Addendum)
Patient: Joel Terry Male    DOB: 1965/12/15   54 y.o.   MRN: [id number] Visit Date: 06/02/2019  Today's Provider: Trinna Post, PA-C   Chief Complaint  Patient presents with  . Abdominal Pain   Subjective:    I, Porsha McClurkin,CMA am acting as a Education administrator for [employer].  Patient with history of ulcerative colitis s/p colectomy presenting today with abdominal pain. He has been having abdominal pain since Sunday that is related to eating and associated with nausea. One episode of vomiting. Denies fevers, reports some chills. Pain worsens 45 min - 1 hour after eating. He has eaten tacos and pasta and reports similar symptoms. Tried pepto bismol which is helpful somewhat.   Abdominal Pain This is a new problem. The current episode started in the past 7 days. The problem occurs constantly. The problem has been unchanged. The pain is located in the generalized abdominal region. The pain is at a severity of 2/10. The pain is mild. The quality of the pain is dull. The abdominal pain does not radiate. Associated symptoms include belching and nausea. Pertinent negatives include no constipation, diarrhea, fever or vomiting. The pain is aggravated by eating. He has tried antacids for the symptoms. The treatment provided mild relief. His past medical history is significant for ulcerative colitis. There is no history of GERD or irritable bowel syndrome.      Allergies  Allergen Reactions  . Sulfa Antibiotics Hives    Increased HR and BP  . Sulfur Rash and Other (See Comments)    Elevated BP and heart rate     Current Outpatient Medications:  .  ALPRAZolam (XANAX) 0.25 MG tablet, TAKE 1 TABLET THREE TIMES A DAY AS NEEDED FOR ANXIETY, Disp: 90 tablet, Rfl: 5 .  benzonatate (TESSALON) 200 MG capsule, Take 1 capsule (200 mg total) by mouth 2 (two) times daily as needed for cough., Disp: 20 capsule, Rfl: 0 .  cefdinir (OMNICEF) 300 MG capsule, Take 1 capsule (300 mg total) by  mouth 2 (two) times daily., Disp: 20 capsule, Rfl: 0 .  citalopram (CELEXA) 20 MG tablet, Take 1 tablet (20 mg total) by mouth daily., Disp: 30 tablet, Rfl: 0 .  fluticasone furoate-vilanterol (BREO ELLIPTA) 100-25 MCG/INH AEPB, Inhale 1 puff into the lungs daily., Disp: 60 each, Rfl: 1 .  lisinopril (PRINIVIL,ZESTRIL) 20 MG tablet, TAKE 1 TABLET DAILY, Disp: 90 tablet, Rfl: 4 .  simvastatin (ZOCOR) 20 MG tablet, TAKE 1 TABLET AT BEDTIME, PLEASE SCHEDULE OFFICE VISIT BEFORE ANY FUTURE REFILLS, Disp: 90 tablet, Rfl: 3 .  SUMAtriptan (IMITREX) 100 MG tablet, Take 1 tablet (100 mg total) by mouth every 2 (two) hours as needed for migraine. May repeat in 2 hours if headache persists or recurs., Disp: 15 tablet, Rfl: 3 .  cyclobenzaprine (FLEXERIL) 5 MG tablet, Take 1-2 tablets (5-10 mg total) by mouth 3 (three) times daily as needed for muscle spasms., Disp: 30 tablet, Rfl: 0 .  methylPREDNISolone (MEDROL) 4 MG TBPK tablet, 6 day taper; take as directed on package instructions, Disp: 21 tablet, Rfl: 0  Review of Systems  Constitutional: Negative for fever.  Gastrointestinal: Positive for abdominal pain and nausea. Negative for constipation, diarrhea and vomiting.    Social History   Tobacco Use  . Smoking status: Never Smoker  . Smokeless tobacco: Never Used  Substance Use Topics  . Alcohol use: Yes    Alcohol/week: 3.0 standard drinks    Types: 3 Cans  of beer per week    Comment: Occassional      Objective:   BP 122/82 (BP Location: Left Arm, Patient Position: Sitting, Cuff Size: Normal)   Pulse 79   Temp (!) 96.8 F (36 C) (Temporal)   Wt 239 lb 9.6 oz (108.7 kg)   SpO2 97%   BMI 35.38 kg/m  Vitals:   06/02/19 0916  BP: 122/82  Pulse: 79  Temp: (!) 96.8 F (36 C)  TempSrc: Temporal  SpO2: 97%  Weight: 239 lb 9.6 oz (108.7 kg)  Body mass index is 35.38 kg/m.   Physical Exam Constitutional:      Appearance: He is well-developed.  Cardiovascular:     Rate and Rhythm:  Normal rate and regular rhythm.  Pulmonary:     Effort: Pulmonary effort is normal.     Breath sounds: Normal breath sounds.  Abdominal:     General: Abdomen is flat. There is no distension.     Palpations: There is no shifting dullness or splenomegaly.     Tenderness: There is abdominal tenderness in the right upper quadrant and right lower quadrant. There is no guarding or rebound.  Skin:    General: Skin is warm and dry.  Neurological:     Mental Status: He is alert.  Psychiatric:        Mood and Affect: Mood normal.        Behavior: Behavior normal.      No results found for any visits on 06/02/19.     Assessment & Plan    1. RUQ pain  Moderate RUQ pain and tenderness along with some RLQ pain worsening with mealtimes concerning for gallstone, cholecystitis; less so pancreatitis, appendicitis,colitis. Labs and ultrasound as below.   Review bloodwork which is normal and does not show leukocytosis, elevated lipase/amylase or transaminitis. Ultrasound shows hepatic steatosis and bilateral benign appearing renal cysts but is otherwise normal. Spoke with patient about results. We will pursue CT abdomen to assess for colitis, appendicitis, etc.    - CBC with Differential - Comprehensive Metabolic Panel (CMET) - Lipase - Amylase - CBC with Differential - Comprehensive Metabolic Panel (CMET) - Lipase - Amylase - US Abdomen Complete; Future - promethazine (PHENERGAN) 12.5 MG tablet; Take 1 tablet (12.5 mg total) by mouth every 8 (eight) hours as needed for nausea or vomiting.  Dispense: 20 tablet; Refill: 0  2. Nausea  - promethazine (PHENERGAN) 12.5 MG tablet; Take 1 tablet (12.5 mg total) by mouth every 8 (eight) hours as needed for nausea or vomiting.  Dispense: 20 tablet; Refill: 0  The entirety of the information documented in the History of Present Illness, Review of Systems and Physical Exam were personally obtained by me. Portions of this information were initially  documented by [facility] and reviewed by me for thoroughness and accuracy.      Trinna Post, PA-C  [facility]

## 2019-06-02 NOTE — Addendum Note (Signed)
Addended by: Trinna Post on: 06/02/2019 12:13 PM   Modules accepted: Orders

## 2019-06-02 NOTE — Telephone Encounter (Signed)
Patient advised.

## 2019-06-02 NOTE — Patient Instructions (Signed)
Cholelithiasis  Cholelithiasis is also called "gallstones." It is a kind of gallbladder disease. The gallbladder is an organ that stores a liquid (bile) that helps you digest fat. Gallstones may not cause symptoms (may be silent gallstones) until they cause a blockage, and then they can cause pain (gallbladder attack). Follow these instructions at home:  Take over-the-counter and prescription medicines only as told by your doctor.  Stay at a healthy weight.  Eat healthy foods. This includes: ? Eating fewer fatty foods, like fried foods. ? Eating fewer refined carbs (refined carbohydrates). Refined carbs are breads and grains that are highly processed, like white bread and white rice. Instead, choose whole grains like whole-wheat bread and brown rice. ? Eating more fiber. Almonds, fresh fruit, and beans are healthy sources of fiber.  Keep all follow-up visits as told by your doctor. This is important. Contact a doctor if:  You have sudden pain in the upper right side of your belly (abdomen). Pain might spread to your right shoulder or your chest. This may be a sign of a gallbladder attack.  You feel sick to your stomach (are nauseous).  You throw up (vomit).  You have been diagnosed with gallstones that have no symptoms and you get: ? Belly pain. ? Discomfort, burning, or fullness in the upper part of your belly (indigestion). Get help right away if:  You have sudden pain in the upper right side of your belly, and it lasts for more than 2 hours.  You have belly pain that lasts for more than 5 hours.  You have a fever or chills.  You keep feeling sick to your stomach or you keep throwing up.  Your skin or the whites of your eyes turn yellow (jaundice).  You have dark-colored pee (urine).  You have light-colored poop (stool). Summary  Cholelithiasis is also called "gallstones."  The gallbladder is an organ that stores a liquid (bile) that helps you digest fat.  Silent  gallstones are gallstones that do not cause symptoms.  A gallbladder attack may cause sudden pain in the upper right side of your belly. Pain might spread to your right shoulder or your chest. If this happens, contact your doctor.  If you have sudden pain in the upper right side of your belly that lasts for more than 2 hours, get help right away. This information is not intended to replace advice given to you by your health care provider. Make sure you discuss any questions you have with your health care provider. Document Revised: 03/14/2017 Document Reviewed: 12/17/2015 Elsevier Patient Education  2020 [employer].

## 2019-06-02 NOTE — Telephone Encounter (Signed)
Work note sent in.

## 2019-06-02 NOTE — Addendum Note (Signed)
Addended by: Trinna Post on: 06/02/2019 03:07 PM   Modules accepted: Orders

## 2019-06-07 ENCOUNTER — Telehealth: Payer: Self-pay

## 2019-06-07 ENCOUNTER — Ambulatory Visit: Payer: Self-pay

## 2019-06-07 DIAGNOSIS — K5190 Ulcerative colitis, unspecified, without complications: Secondary | ICD-10-CM

## 2019-06-07 DIAGNOSIS — R109 Unspecified abdominal pain: Secondary | ICD-10-CM

## 2019-06-07 NOTE — Telephone Encounter (Signed)
Incoming call from Patient Stating that he was last seen in the office by Carles Collet PA-C for abdominal Pain.  Rated high mild.   Patient reports that pain is getting worse.   Pain is constant. Getting worse.  Mostly on right side.  Sudden onset.  Reports mostly on right side.   Denies vomiting and diarrhea. Reports appetite is decreasing.  Patient is calling for a referral for a gastroenterologist. May reach Patient at.  [phone] .  Patient awaits return phone call.       Reason for Disposition . [1] MILD-MODERATE pain AND [2] constant AND [3] present > 2 hours  Answer Assessment - Initial Assessment Questions 1. LOCATION: "Where does it hurt?"     Right side 2. RADIATION: "Does the pain shoot anywhere else?" (e.g., chest, back)    Complete abdominal hurts worse on right side 3. ONSET: "When did the pain begin?" (e.g., minutes, hours or days ago)      Sunday the 14th 4. SUDDEN: "Gradual or sudden onset?"  sudden 5. PATTERN "Does the pain come and go, or is it constant?"    - If constant: "Is it getting better, staying the same, or worsening?"      (Note: Constant means the pain never goes away completely; most serious pain is constant and it progresses)     - If intermittent: "How long does it last?" "Do you have pain now?"     (Note: Intermittent means the pain goes away completely between bouts)   constant 6. SEVERITY: "How bad is the pain?"  (e.g., Scale 1-10; mild, moderate, or severe)    - MILD (1-3): doesn't interfere with normal activities, abdomen soft and not tender to touch     - MODERATE (4-7): interferes with normal activities or awakens from sleep, tender to touch     - SEVERE (8-10): excruciating pain, doubled over, unable to do any normal activities     High mild 7. RECURRENT SYMPTOM: "Have you ever had this type of abdominal pain before?" If so, ask: "When was the last time?" and "What happened that time?"     yes 8. AGGRAVATING FACTORS: "Does anything seem to  cause this pain?" (e.g., foods, stress, alcohol)   denis 9. CARDIAC SYMPTOMS: "Do you have any of the following symptoms: chest pain, difficulty breathing, sweating, nausea?"    denies 10. OTHER SYMPTOMS: "Do you have any other symptoms?" (e.g., fever, vomiting, diarrhea)     denies 11. PREGNANCY: "Is there any chance you are pregnant?" "When was your last menstrual period?"     na  Protocols used: ABDOMINAL PAIN - UPPER-A-AH

## 2019-06-07 NOTE — Telephone Encounter (Signed)
I know he had previously seen Dr. Gustavo Lah. Does he wish to stay with this practice (now Dr. Alice Reichert) or does he wish to change? There is an IBD specialist with [facility], Dr. Marius Ditch. I could refer to her if he prefers.   Does his pain seem like a UC flare?

## 2019-06-07 NOTE — Telephone Encounter (Signed)
Patient reports a constant pain. Patient's wife sees Dr. Vicente Males with [facility]. However, Patient is open to Jenni's preference.

## 2019-06-07 NOTE — Telephone Encounter (Signed)
Copied from [place] [id number]. Topic: Referral - Request for Referral >> Jun 07, 2019 10:51 AM Erick Blinks wrote: Has patient seen PCP for this complaint? Yes. *If NO, is insurance requiring patient see PCP for this issue before PCP can refer them? Referral for which specialty: GI Preferred provider/office: Dr. Vicente Males at [facility]  Reason for referral: Abdominal Pain

## 2019-06-07 NOTE — Telephone Encounter (Signed)
Patient spoke to office at 3:43 regarding referrals.

## 2019-06-07 NOTE — Telephone Encounter (Signed)
LMTCB 06/07/2019.  PEC please advise pt below when he calls back.   Thanks,   -Laur a

## 2019-06-07 NOTE — Addendum Note (Signed)
Addended by: Mar Daring on: 06/07/2019 03:43 PM   Modules accepted: Orders

## 2019-06-07 NOTE — Telephone Encounter (Signed)
Referral placed. Put for either Dr. Vicente Males or Dr. Marius Ditch (she does IBD)

## 2019-07-08 ENCOUNTER — Other Ambulatory Visit: Payer: Self-pay | Admitting: Family Medicine

## 2019-07-08 DIAGNOSIS — E785 Hyperlipidemia, unspecified: Secondary | ICD-10-CM

## 2019-07-08 NOTE — Telephone Encounter (Signed)
LOV  06/02/19  LRF  07/13/18  # 90 x 3  Last Lipids 01/10/2017

## 2019-07-08 NOTE — Telephone Encounter (Signed)
Requested  medications are  due for refill today yes  Requested medications are on the active medication list yes  Last refill 12/27  Future visit scheduled no  Notes to clinic only seen by PA for abd pain recently, last refill stated to make appt.

## 2019-07-28 ENCOUNTER — Ambulatory Visit (INDEPENDENT_AMBULATORY_CARE_PROVIDER_SITE_OTHER): Payer: BC Managed Care – PPO | Admitting: Gastroenterology

## 2019-07-28 ENCOUNTER — Other Ambulatory Visit: Payer: Self-pay

## 2019-07-28 VITALS — BP 187/108 | HR 76 | Temp 97.9°F | Ht 69.0 in | Wt 239.4 lb

## 2019-07-28 DIAGNOSIS — Z8719 Personal history of other diseases of the digestive system: Secondary | ICD-10-CM

## 2019-07-28 DIAGNOSIS — R1031 Right lower quadrant pain: Secondary | ICD-10-CM

## 2019-07-28 NOTE — Progress Notes (Signed)
Jonathon Bellows MD, MRCP(U.K) [address]  [place]  [place], [place] [address]  Main: [phone]  Fax: [phone]   Gastroenterology Consultation  Referring Provider:     Florian Buff* Primary Care Physician:  Mar Daring, PA-C Primary Gastroenterologist:  Dr. Jonathon Bellows  Reason for Consultation:     History of ulcerative colitis with colectomy transfer of care        HPI:   Joel Terry is a 54 y.o. y/o male referred for consultation & management  By . Mar Daring, PA-C.    Previously a patient of Dr. Gustavo Lah last seen at their office back in January 2020.  Looking back at their notes it appears that he had ulcerative colitis diagnosed in the year 2000 and was treated with multiple medications and eventually ended up having a total proctocolectomy with ileal pouch anastomosis and diverting loop ileostomy with reversal 6 months later.  He was seen in the primary care office in February 2021 for abdominal pain right upper quadrant, Right upper quadrant ultrasound demonstrated no acute abnormalities, hepatic steatosis, bilateral benign-appearing renal cysts.  Common bile duct was 1.8 mm.  No gallstones or wall thickening was visualized.  This was followed by a CT scan of the abdomen which demonstrated no acute abnormality.  Previous subtotal colectomy with distal small bowel to rectal anastomosis in the pelvis.  No focal inflammation.  Narrow appearance of the anastomosis although there is only borderline upstream small bowel dilation.  Left lower pole nephrolithiasis.  06/02/2019: CMP normal, CBC normal, amylase and lipase also normal. July 2019: Flexible sigmoidoscopy showed no gross abnormalities.  Biopsies of the anorectal verge showed no abnormalities  He states that a few weeks back he developed some right mid and lower quadrant discomfort first in the morning not aggravated or relieved by any clear factors.  Felt like a cramp sometimes like a  deep pressure.  Denies any change in bowel movements.  No other symptoms.  Gradually things have been improving and the symptoms are almost resolved.  Past Medical History:  Diagnosis Date  . Asthma   . Chronic kidney disease   . Headache   . History of kidney stones   . Hypertension   . Nephrolithiasis   . Obesity   . Ulcerative colitis [facility])     Past Surgical History:  Procedure Laterality Date  . COLECTOMY    . COLONOSCOPY WITH PROPOFOL N/A 09/16/2014   Procedure: COLONOSCOPY WITH PROPOFOL;  Surgeon: Lollie Sails, MD;  Location: [facility] ENDOSCOPY;  Service: Endoscopy;  Laterality: N/A;  . FLEXIBLE SIGMOIDOSCOPY N/A 07/11/2017   Procedure: FLEXIBLE SIGMOIDOSCOPY;  Surgeon: Lollie Sails, MD;  Location: [facility] ENDOSCOPY;  Service: Endoscopy;  Laterality: N/A;  . FLEXIBLE SIGMOIDOSCOPY N/A 10/23/2017   Procedure: FLEXIBLE SIGMOIDOSCOPY;  Surgeon: Lollie Sails, MD;  Location: [facility] ENDOSCOPY;  Service: Endoscopy;  Laterality: N/A;  . HERNIA REPAIR      Prior to Admission medications   Medication Sig Start Date End Date Taking? Authorizing Provider  ALPRAZolam Duanne Moron) 0.25 MG tablet TAKE 1 TABLET THREE TIMES A DAY AS NEEDED FOR ANXIETY 10/28/17   Mar Daring, PA-C  benzonatate (TESSALON) 200 MG capsule Take 1 capsule (200 mg total) by mouth 2 (two) times daily as needed for cough. 06/20/18   Chrismon, Vickki Muff, PA  cefdinir (OMNICEF) 300 MG capsule Take 1 capsule (300 mg total) by mouth 2 (two) times daily. 06/20/18   Chrismon, Vickki Muff, PA  citalopram (CELEXA) 20 MG tablet Take 1 tablet (20 mg total) by mouth daily. 04/10/16   Mar Daring, PA-C  cyclobenzaprine (FLEXERIL) 5 MG tablet Take 1-2 tablets (5-10 mg total) by mouth 3 (three) times daily as needed for muscle spasms. 01/29/19   Mar Daring, PA-C  fluticasone furoate-vilanterol (BREO ELLIPTA) 100-25 MCG/INH AEPB Inhale 1 puff into the lungs daily. 06/12/18   Mar Daring, PA-C  lisinopril  (PRINIVIL,ZESTRIL) 20 MG tablet TAKE 1 TABLET DAILY 05/07/18   Mar Daring, PA-C  methylPREDNISolone (MEDROL) 4 MG TBPK tablet 6 day taper; take as directed on package instructions 01/29/19   Mar Daring, PA-C  promethazine (PHENERGAN) 12.5 MG tablet Take 1 tablet (12.5 mg total) by mouth every 8 (eight) hours as needed for nausea or vomiting. 06/02/19   Trinna Post, PA-C  simvastatin (ZOCOR) 20 MG tablet TAKE 1 TABLET AT BEDTIME (SCHEDULE OFFICE VISIT BEFORE ANY FUTURE REFILLS) 07/08/19   Mar Daring, PA-C  SUMAtriptan (IMITREX) 100 MG tablet Take 1 tablet (100 mg total) by mouth every 2 (two) hours as needed for migraine. May repeat in 2 hours if headache persists or recurs. 07/14/15   Mar Daring, PA-C    No family history on file.   Social History   Tobacco Use  . Smoking status: Never Smoker  . Smokeless tobacco: Never Used  Substance Use Topics  . Alcohol use: Yes    Alcohol/week: 3.0 standard drinks    Types: 3 Cans of beer per week    Comment: Occassional  . Drug use: No    Allergies as of 07/28/2019 - Review Complete 06/02/2019  Allergen Reaction Noted  . Sulfa antibiotics Hives 04/25/2015  . Sulfur Rash and Other (See Comments) 09/05/2014    Review of Systems:    All systems reviewed and negative except where noted in HPI.   Physical Exam:  There were no vitals taken for this visit. No LMP for male patient. Psych:  Alert and cooperative. Normal mood and affect. General:   Alert,  Well-developed, well-nourished, pleasant and cooperative in NAD Head:  Normocephalic and atraumatic. Eyes:  Sclera clear, no icterus.   Conjunctiva pink. Ears:  Normal auditory acuity. Abdomen:  Normal bowel sounds.  No bruits.  Soft, non-tender and non-distended without masses, hepatosplenomegaly or hernias noted.  No guarding or rebound tenderness.    Neurologic:  Alert and oriented x3;  grossly normal neurologically. Psych:  Alert and cooperative.  Normal mood and affect.  Imaging Studies: No results found.  Assessment and Plan:   Joel Terry is a 54 y.o. y/o male has been referred for right-sided abdominal discomfort which has almost resolved.  History of colectomy in 2001 for ulcerative colitis.  Ileorectal anastomosis.  No symptoms presently.  Very likely that abdominal discomfort was spasms versus trapping of gas may be a mild infection of the GI tract which has resolved on its own.  Suggested he can try peppermint oil tablets as needed in the future if he gets any spasms.  If the issue persists may need a HIDA scan to evaluate the gallbladder.  Since he has a few centimeters of rectal mucosa and it has been about 2 years since it has been evaluated I suggest a sigmoidoscopy unsedated to evaluate the rectal cuff  I have discussed alternative options, risks & benefits,  which include, but are not limited to, bleeding, infection, perforation,respiratory complication & drug reaction.  The patient agrees with this plan &  written consent will be obtained.     Follow up as needed  Dr Jonathon Bellows MD,MRCP(U.K)

## 2019-08-01 ENCOUNTER — Other Ambulatory Visit: Payer: Self-pay | Admitting: Physician Assistant

## 2019-08-01 DIAGNOSIS — I10 Essential (primary) hypertension: Secondary | ICD-10-CM

## 2019-08-01 NOTE — Telephone Encounter (Signed)
Requested Prescriptions  Pending Prescriptions Disp Refills  . lisinopril (ZESTRIL) 20 MG tablet [Pharmacy Med Name: LISINOPRIL TABS 20MG] 90 tablet 1    Sig: TAKE 1 TABLET DAILY     Cardiovascular:  ACE Inhibitors Failed - 08/01/2019  3:37 PM      Failed - Last BP in normal range    BP Readings from Last 1 Encounters:  07/28/19 (!) 187/108         Passed - Cr in normal range and within 180 days    Creat  Date Value Ref Range Status  01/10/2017 0.91 0.60 - 1.35 mg/dL Final   Creatinine, Ser  Date Value Ref Range Status  06/02/2019 1.19 0.61 - 1.24 mg/dL Final         Passed - K in normal range and within 180 days    Potassium  Date Value Ref Range Status  06/02/2019 4.3 3.5 - 5.1 mmol/L Final         Passed - Patient is not pregnant      Passed - Valid encounter within last 6 months    Recent Outpatient Visits          2 months ago RUQ pain   [facility] Trinna Post, [place]   1 year ago [place], Vickki Muff, [place]   1 year ago [place], [place], [place]   1 year ago Diarrhea, unspecified type   [place], Wendee Beavers, [place]   2 years ago Strain of lumbar region, initial encounter   [place], [place], [place]

## 2019-08-04 ENCOUNTER — Other Ambulatory Visit
Admission: RE | Admit: 2019-08-04 | Discharge: 2019-08-04 | Disposition: A | Discharge status: Home / Self Care | Payer: BC Managed Care – PPO | Source: Ambulatory Visit | Attending: Gastroenterology | Admitting: Gastroenterology

## 2019-08-04 ENCOUNTER — Other Ambulatory Visit: Payer: Self-pay

## 2019-08-04 DIAGNOSIS — Z01812 Encounter for preprocedural laboratory examination: Secondary | ICD-10-CM | POA: Diagnosis not present

## 2019-08-04 DIAGNOSIS — Z20822 Contact with and (suspected) exposure to covid-19: Secondary | ICD-10-CM | POA: Diagnosis not present

## 2019-08-04 LAB — SARS CORONAVIRUS 2 (TAT 6-24 HRS): SARS Coronavirus 2: NEGATIVE

## 2019-08-05 ENCOUNTER — Encounter: Payer: Self-pay | Admitting: Gastroenterology

## 2019-08-06 ENCOUNTER — Encounter: Payer: Self-pay | Admitting: Certified Registered Nurse Anesthetist

## 2019-08-06 ENCOUNTER — Encounter: Payer: Self-pay | Admitting: Gastroenterology

## 2019-08-06 ENCOUNTER — Other Ambulatory Visit: Payer: Self-pay

## 2019-08-06 ENCOUNTER — Telehealth: Payer: Self-pay

## 2019-08-06 ENCOUNTER — Encounter
Admission: RE | Disposition: A | Discharge status: Home / Self Care | Payer: Self-pay | Source: Home / Self Care | Attending: Gastroenterology

## 2019-08-06 ENCOUNTER — Ambulatory Visit
Admission: RE | Admit: 2019-08-06 | Discharge: 2019-08-06 | Disposition: A | Discharge status: Home / Self Care | Payer: BC Managed Care – PPO | Attending: Gastroenterology | Admitting: Gastroenterology

## 2019-08-06 DIAGNOSIS — K5100 Ulcerative (chronic) pancolitis without complications: Secondary | ICD-10-CM | POA: Diagnosis not present

## 2019-08-06 DIAGNOSIS — I129 Hypertensive chronic kidney disease with stage 1 through stage 4 chronic kidney disease, or unspecified chronic kidney disease: Secondary | ICD-10-CM | POA: Insufficient documentation

## 2019-08-06 DIAGNOSIS — Z7951 Long term (current) use of inhaled steroids: Secondary | ICD-10-CM | POA: Insufficient documentation

## 2019-08-06 DIAGNOSIS — Z9049 Acquired absence of other specified parts of digestive tract: Secondary | ICD-10-CM | POA: Diagnosis not present

## 2019-08-06 DIAGNOSIS — E669 Obesity, unspecified: Secondary | ICD-10-CM | POA: Insufficient documentation

## 2019-08-06 DIAGNOSIS — J45909 Unspecified asthma, uncomplicated: Secondary | ICD-10-CM | POA: Insufficient documentation

## 2019-08-06 DIAGNOSIS — Z882 Allergy status to sulfonamides status: Secondary | ICD-10-CM | POA: Diagnosis not present

## 2019-08-06 DIAGNOSIS — Z6836 Body mass index (BMI) 36.0-36.9, adult: Secondary | ICD-10-CM | POA: Diagnosis not present

## 2019-08-06 DIAGNOSIS — R1031 Right lower quadrant pain: Secondary | ICD-10-CM

## 2019-08-06 DIAGNOSIS — N189 Chronic kidney disease, unspecified: Secondary | ICD-10-CM | POA: Diagnosis not present

## 2019-08-06 DIAGNOSIS — Z79899 Other long term (current) drug therapy: Secondary | ICD-10-CM | POA: Diagnosis not present

## 2019-08-06 DIAGNOSIS — Z8719 Personal history of other diseases of the digestive system: Secondary | ICD-10-CM | POA: Diagnosis not present

## 2019-08-06 DIAGNOSIS — F419 Anxiety disorder, unspecified: Secondary | ICD-10-CM | POA: Insufficient documentation

## 2019-08-06 HISTORY — PX: FLEXIBLE SIGMOIDOSCOPY: SHX5431

## 2019-08-06 SURGERY — SIGMOIDOSCOPY, FLEXIBLE

## 2019-08-06 MED ORDER — LIDOCAINE HCL URETHRAL/MUCOSAL 2 % EX GEL
CUTANEOUS | Status: AC
  Filled 2019-08-06: qty 5

## 2019-08-06 MED ORDER — SODIUM CHLORIDE 0.9 % IV SOLN: INTRAVENOUS | Status: DC

## 2019-08-06 NOTE — Progress Notes (Signed)
Informed his diastolic BP > 035 mm Hg, was elevated before the procedure and after .Has been elevated at home too. I recommended him to go to the ER. Informed risks of strokes and intra cerebral bleed. He said he is aware of the risks and will go home and take his meds and will not go to the ER. He said he will contact his PCP asap.    Dr Jonathon Bellows MD,MRCP [facility]) Gastroenterology/Hepatology Pager: [phone]

## 2019-08-06 NOTE — H&P (Signed)
Jonathon Bellows, MD [address] [place], [place], [place], [address] [address], [place], [place], [place], [address] Phone: [phone]  Fax: [phone]  Primary Care Physician:  Mar Daring, PA-C   Pre-Procedure History & Physical: HPI:  Joel Terry is a 54 y.o. male is here for a sigmoidoscopy   Past Medical History:  Diagnosis Date  . Asthma   . Chronic kidney disease   . Headache   . History of kidney stones   . Hypertension   . Nephrolithiasis   . Obesity   . Ulcerative colitis [facility])     Past Surgical History:  Procedure Laterality Date  . COLECTOMY    . COLONOSCOPY WITH PROPOFOL N/A 09/16/2014   Procedure: COLONOSCOPY WITH PROPOFOL;  Surgeon: Lollie Sails, MD;  Location: [facility] ENDOSCOPY;  Service: Endoscopy;  Laterality: N/A;  . FLEXIBLE SIGMOIDOSCOPY N/A 07/11/2017   Procedure: FLEXIBLE SIGMOIDOSCOPY;  Surgeon: Lollie Sails, MD;  Location: [facility] ENDOSCOPY;  Service: Endoscopy;  Laterality: N/A;  . FLEXIBLE SIGMOIDOSCOPY N/A 10/23/2017   Procedure: FLEXIBLE SIGMOIDOSCOPY;  Surgeon: Lollie Sails, MD;  Location: [facility] ENDOSCOPY;  Service: Endoscopy;  Laterality: N/A;  . HERNIA REPAIR      Prior to Admission medications   Medication Sig Start Date End Date Taking? Authorizing Provider  lisinopril (ZESTRIL) 20 MG tablet TAKE 1 TABLET DAILY 08/01/19  Yes [place]ette, Clearnce Sorrel, PA-C  ALPRAZolam Duanne Moron) 0.25 MG tablet TAKE 1 TABLET THREE TIMES A DAY AS NEEDED FOR ANXIETY 10/28/17   Mar Daring, PA-C  benzonatate (TESSALON) 200 MG capsule Take 1 capsule (200 mg total) by mouth 2 (two) times daily as needed for cough. Patient not taking: Reported on 07/28/2019 06/20/18   Chrismon, Vickki Muff, PA  cefdinir (OMNICEF) 300 MG capsule Take 1 capsule (300 mg total) by mouth 2 (two) times daily. Patient not taking: Reported on 07/28/2019 06/20/18   Chrismon, Vickki Muff, PA  citalopram (CELEXA) 20 MG tablet Take 1 tablet (20 mg total) by mouth daily.  Patient not taking: Reported on 07/28/2019 04/10/16   Mar Daring, PA-C  cyclobenzaprine (FLEXERIL) 5 MG tablet Take 1-2 tablets (5-10 mg total) by mouth 3 (three) times daily as needed for muscle spasms. 01/29/19   Mar Daring, PA-C  fluticasone furoate-vilanterol (BREO ELLIPTA) 100-25 MCG/INH AEPB Inhale 1 puff into the lungs daily. 06/12/18   Mar Daring, PA-C  methylPREDNISolone (MEDROL) 4 MG TBPK tablet 6 day taper; take as directed on package instructions 01/29/19   Mar Daring, PA-C  promethazine (PHENERGAN) 12.5 MG tablet Take 1 tablet (12.5 mg total) by mouth every 8 (eight) hours as needed for nausea or vomiting. Patient not taking: Reported on 07/28/2019 06/02/19   Trinna Post, PA-C  simvastatin (ZOCOR) 20 MG tablet TAKE 1 TABLET AT BEDTIME (SCHEDULE OFFICE VISIT BEFORE ANY FUTURE REFILLS) 07/08/19   Mar Daring, PA-C  SUMAtriptan (IMITREX) 100 MG tablet Take 1 tablet (100 mg total) by mouth every 2 (two) hours as needed for migraine. May repeat in 2 hours if headache persists or recurs. Patient not taking: Reported on 07/28/2019 07/14/15   Mar Daring, PA-C    Allergies as of 07/28/2019 - Review Complete 07/28/2019  Allergen Reaction Noted  . Sulfa antibiotics Hives 04/25/2015  . Sulfur Rash and Other (See Comments) 09/05/2014    History reviewed. No pertinent family history.  Social History   Socioeconomic History  . Marital status: Married    Spouse  name: Not on file  . Number of children: Not on file  . Years of education: Not on file  . Highest education level: Not on file  Occupational History  . Not on file  Tobacco Use  . Smoking status: Never Smoker  . Smokeless tobacco: Never Used  Substance and Sexual Activity  . Alcohol use: Yes    Alcohol/week: 3.0 standard drinks    Types: 3 Cans of beer per week    Comment: Occassional  . Drug use: No  . Sexual activity: Yes  Other Topics Concern  . Not on file   Social History Narrative  . Not on file   Social Determinants of Health   Financial Resource Strain:   . Difficulty of Paying Living Expenses:   Food Insecurity:   . Worried About Charity fundraiser in the Last Year:   . Arboriculturist in the Last Year:   Transportation Needs:   . Film/video editor (Medical):   Marland Kitchen Lack of Transportation (Non-Medical):   Physical Activity:   . Days of Exercise per Week:   . Minutes of Exercise per Session:   Stress:   . Feeling of Stress :   Social Connections:   . Frequency of Communication with Friends and Family:   . Frequency of Social Gatherings with Friends and Family:   . Attends Religious Services:   . Active Member of Clubs or Organizations:   . Attends Archivist Meetings:   Marland Kitchen Marital Status:   Intimate Partner Violence:   . Fear of Current or Ex-Partner:   . Emotionally Abused:   Marland Kitchen Physically Abused:   . Sexually Abused:     Review of Systems: See HPI, otherwise negative ROS  Physical Exam: BP (!) 187/117   Pulse 81   Temp (!) 97.1 F (36.2 C) (Temporal)   Resp 18   Ht 5' 8"  (1.727 m)   Wt 108.9 kg   SpO2 99%   BMI 36.49 kg/m  General:   Alert,  pleasant and cooperative in NAD Head:  Normocephalic and atraumatic. Neck:  Supple; no masses or thyromegaly. Lungs:  Clear throughout to auscultation, normal respiratory effort.    Heart:  +S1, +S2, Regular rate and rhythm, No edema. Abdomen:  Soft, nontender and nondistended. Normal bowel sounds, without guarding, and without rebound.   Neurologic:  Alert and  oriented x4;  grossly normal neurologically.  Impression/Plan: Joel Terry is here for a sigmoidoscopy for dys[plasia surveillance. Risks, benefits, limitations, and alternatives regarding  colonoscopy have been reviewed with the patient.  Questions have been answered.  All parties agreeable.   Jonathon Bellows, MD  08/06/2019, 10:49 AM

## 2019-08-06 NOTE — Op Note (Signed)
[facility] Gastroenterology Patient Name: Joel Terry Procedure Date: 08/06/2019 10:51 AM MRN: [id number] Account #: [id number] Date of Birth: Jan 10, 1966 Admit Type: Outpatient Age: 54 Room: [facility] ENDO ROOM 2 Gender: Male Note Status: Finalized Procedure:             Flexible Sigmoidoscopy Indications:           High risk colon cancer surveillance: Ulcerative                         pancolitis of 8 (or more) years duration Providers:             Jonathon Bellows MD, MD Medicines:             None Complications:         No immediate complications. Procedure:             Pre-Anesthesia Assessment:                        - Prior to the procedure, a History and Physical was                         performed, and patient medications, allergies and                         sensitivities were reviewed. The patient's tolerance                         of previous anesthesia was reviewed.                        - The risks and benefits of the procedure and the                         sedation options and risks were discussed with the                         patient. All questions were answered and informed                         consent was obtained.                        - ASA Grade Assessment: II - A patient with mild                         systemic disease.                        After obtaining informed consent, the scope was passed                         under direct vision. The Endoscope was introduced                         through the anus and advanced to the the ileo-rectal                         anastomosis. The flexible sigmoidoscopy was  accomplished with ease. The patient tolerated the                         procedure well. The quality of the bowel preparation                         was adequate. Findings:      The perianal and digital rectal examinations were normal.      The rectum and anastomosis appeared normal. Biopsies were  taken with a       cold forceps for histology. These biopsies were obtained randomly (in a       non-targeted manner) for evaluation of inflammatory bowel disease. Impression:            - The rectum and colonic anastomosis are normal.                         Biopsied. Recommendation:        - Discharge patient to home (with escort).                        - Resume previous diet.                        - Continue present medications. 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 All rights reserved. The codes documented in this report are preliminary and upon coder review may  be revised to meet current compliance requirements. Jonathon Bellows, MD Jonathon Bellows MD, MD 08/06/2019 11:11:12 AM This report has been signed electronically. Number of Addenda: 0 Note Initiated On: 08/06/2019 10:51 AM Estimated Blood Loss:  Estimated blood loss: none.      [facility]

## 2019-08-06 NOTE — Telephone Encounter (Signed)
Called patient that we need to see him for his blood pressure and physical. If patient calls back OK for the [facility] to schedule him for physical thanks.

## 2019-08-08 NOTE — Progress Notes (Signed)
Voicemail.  No Message left.

## 2019-08-09 ENCOUNTER — Emergency Department
Admission: EM | Admit: 2019-08-09 | Discharge: 2019-08-09 | Disposition: A | Discharge status: Home / Self Care | Payer: BC Managed Care – PPO | Attending: Emergency Medicine | Admitting: Emergency Medicine

## 2019-08-09 ENCOUNTER — Encounter: Payer: Self-pay | Admitting: Emergency Medicine

## 2019-08-09 ENCOUNTER — Other Ambulatory Visit: Payer: Self-pay

## 2019-08-09 ENCOUNTER — Emergency Department: Payer: BC Managed Care – PPO

## 2019-08-09 DIAGNOSIS — N189 Chronic kidney disease, unspecified: Secondary | ICD-10-CM | POA: Insufficient documentation

## 2019-08-09 DIAGNOSIS — M542 Cervicalgia: Secondary | ICD-10-CM | POA: Diagnosis not present

## 2019-08-09 DIAGNOSIS — I129 Hypertensive chronic kidney disease with stage 1 through stage 4 chronic kidney disease, or unspecified chronic kidney disease: Secondary | ICD-10-CM | POA: Diagnosis not present

## 2019-08-09 DIAGNOSIS — M62838 Other muscle spasm: Secondary | ICD-10-CM | POA: Insufficient documentation

## 2019-08-09 DIAGNOSIS — Z79899 Other long term (current) drug therapy: Secondary | ICD-10-CM | POA: Diagnosis not present

## 2019-08-09 DIAGNOSIS — I10 Essential (primary) hypertension: Secondary | ICD-10-CM | POA: Diagnosis not present

## 2019-08-09 DIAGNOSIS — G44201 Tension-type headache, unspecified, intractable: Secondary | ICD-10-CM | POA: Diagnosis not present

## 2019-08-09 DIAGNOSIS — G44209 Tension-type headache, unspecified, not intractable: Secondary | ICD-10-CM | POA: Diagnosis not present

## 2019-08-09 LAB — SURGICAL PATHOLOGY

## 2019-08-09 LAB — BASIC METABOLIC PANEL
Anion gap: 7 (ref 5–15)
BUN: 16 mg/dL (ref 6–20)
CO2: 24 mmol/L (ref 22–32)
Calcium: 9.1 mg/dL (ref 8.9–10.3)
Chloride: 107 mmol/L (ref 98–111)
Creatinine, Ser: 1.08 mg/dL (ref 0.61–1.24)
GFR calc Af Amer: >60 mL/min (ref >60)
GFR calc non Af Amer: >60 mL/min (ref >60)
Glucose, Bld: 109 mg/dL — ABNORMAL HIGH (ref 70–99)
Potassium: 3.9 mmol/L (ref 3.5–5.1)
Sodium: 138 mmol/L (ref 135–145)

## 2019-08-09 LAB — CBC
HCT: 47.9 % (ref 39.0–52.0)
Hemoglobin: 16.7 g/dL (ref 13.0–17.0)
MCH: 31.5 pg (ref 26.0–34.0)
MCHC: 34.9 g/dL (ref 30.0–36.0)
MCV: 90.2 fL (ref 80.0–100.0)
Platelets: 388 10*3/uL (ref 150–400)
RBC: 5.31 MIL/uL (ref 4.22–5.81)
RDW: 13.3 % (ref 11.5–15.5)
WBC: 6.7 10*3/uL (ref 4.0–10.5)
nRBC: 0 % (ref 0.0–0.2)

## 2019-08-09 LAB — TROPONIN I (HIGH SENSITIVITY): Troponin I (High Sensitivity): 7 ng/L (ref <18)

## 2019-08-09 MED ORDER — NAPROXEN 500 MG PO TABS
500.0000 mg | ORAL_TABLET | Freq: Two times a day (BID) | ORAL | 2 refills | Status: DC
Start: 2019-08-09 — End: 2020-08-24

## 2019-08-09 MED ORDER — HYDROCODONE-ACETAMINOPHEN 5-325 MG PO TABS: 1.0000 | ORAL_TABLET | Freq: Four times a day (QID) | ORAL | 0 refills | Status: DC | PRN

## 2019-08-09 MED ORDER — KETOROLAC TROMETHAMINE 30 MG/ML IJ SOLN
30.0000 mg | Freq: Once | INTRAMUSCULAR | Status: AC
  Administered 2019-08-09: 30 mg via INTRAMUSCULAR
  Filled 2019-08-09: qty 1

## 2019-08-09 MED ORDER — METHOCARBAMOL 500 MG PO TABS: 500.0000 mg | ORAL_TABLET | Freq: Three times a day (TID) | ORAL | 0 refills | Status: DC | PRN

## 2019-08-09 NOTE — ED Triage Notes (Signed)
Pt c/o left neck pain that radiates into head.  Pain worse when turns neck to right. No chest pain. Neck pain does not radiate.  Also here because bp has been elevated at home despite taking meds.

## 2019-08-09 NOTE — ED Notes (Signed)
Pt c/o pain in upper neck, rt behind left ear x 1 week. Pain urelieved w/ OTC meds. Pt has hx of migraines but states this is different. Pain worse w/ head turning to the right.

## 2019-08-09 NOTE — ED Provider Notes (Signed)
[facility] Emergency Department Provider Note   ____________________________________________    I have reviewed the triage vital signs and the nursing notes.   HISTORY  Chief Complaint Neck Pain     HPI Joel Terry is a 54 y.o. male with a history as noted below who presents with complaints of left posterior neck pain.  Patient points at the left trapezius insertion site as the area of pain.  He reports the pain travels from there along the left side of his scalp towards his forehead.  Denies neuro deficits.  Has taken over-the-counter medications with little improvement.  Patient reports symptoms been ongoing for a week.  No trauma or injury to the area.  No fevers or chills.  Past Medical History:  Diagnosis Date  . Asthma   . Chronic kidney disease   . Headache   . History of kidney stones   . Hypertension   . Nephrolithiasis   . Obesity   . Ulcerative colitis [facility])     Patient Active Problem List   Diagnosis Date Noted  . Asthma 06/10/2018  . Incomplete emptying of bladder 12/01/2015  . 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  . H/O renal calculi 04/26/2015  . Benign prostatic hyperplasia with urinary obstruction 04/26/2015  . Migraines 01/13/2015  . Ulcerative colitis ([place]) 01/13/2015  . Adenoma of pituitary ([facility]) 06/16/2014  . Allergic rhinitis 12/16/2013  . Benign essential HTN 12/16/2013  . HLD (hyperlipidemia) 12/16/2013    Past Surgical History:  Procedure Laterality Date  . COLECTOMY    . COLONOSCOPY WITH PROPOFOL N/A 09/16/2014   Procedure: COLONOSCOPY WITH PROPOFOL;  Surgeon: Lollie Sails, MD;  Location: [facility] ENDOSCOPY;  Service: Endoscopy;  Laterality: N/A;  . FLEXIBLE SIGMOIDOSCOPY N/A 07/11/2017   Procedure: FLEXIBLE SIGMOIDOSCOPY;  Surgeon: Lollie Sails, MD;  Location: [facility] ENDOSCOPY;  Service: Endoscopy;  Laterality: N/A;  . FLEXIBLE SIGMOIDOSCOPY N/A 10/23/2017   Procedure: FLEXIBLE  SIGMOIDOSCOPY;  Surgeon: Lollie Sails, MD;  Location: [facility] ENDOSCOPY;  Service: Endoscopy;  Laterality: N/A;  . FLEXIBLE SIGMOIDOSCOPY N/A 08/06/2019   Procedure: FLEXIBLE SIGMOIDOSCOPY;  Surgeon: Jonathon Bellows, MD;  Location: [facility] ENDOSCOPY;  Service: Gastroenterology;  Laterality: N/A;  . HERNIA REPAIR      Prior to Admission medications   Medication Sig Start Date End Date Taking? Authorizing Provider  ALPRAZolam Duanne Moron) 0.25 MG tablet TAKE 1 TABLET THREE TIMES A DAY AS NEEDED FOR ANXIETY 10/28/17   Mar Daring, PA-C  benzonatate (TESSALON) 200 MG capsule Take 1 capsule (200 mg total) by mouth 2 (two) times daily as needed for cough. Patient not taking: Reported on 07/28/2019 06/20/18   Chrismon, Vickki Muff, PA  cefdinir (OMNICEF) 300 MG capsule Take 1 capsule (300 mg total) by mouth 2 (two) times daily. Patient not taking: Reported on 07/28/2019 06/20/18   Chrismon, Vickki Muff, PA  citalopram (CELEXA) 20 MG tablet Take 1 tablet (20 mg total) by mouth daily. Patient not taking: Reported on 07/28/2019 04/10/16   Mar Daring, PA-C  cyclobenzaprine (FLEXERIL) 5 MG tablet Take 1-2 tablets (5-10 mg total) by mouth 3 (three) times daily as needed for muscle spasms. 01/29/19   Mar Daring, PA-C  fluticasone furoate-vilanterol (BREO ELLIPTA) 100-25 MCG/INH AEPB Inhale 1 puff into the lungs daily. 06/12/18   Mar Daring, PA-C  HYDROcodone-acetaminophen (NORCO/VICODIN) 5-325 MG tablet Take 1 tablet by mouth every 6 (six) hours as needed for severe pain. 08/09/19   Lavonia Drafts, MD  lisinopril (ZESTRIL) 20 MG tablet TAKE 1 TABLET DAILY 08/01/19   Mar Daring, PA-C  methocarbamol (ROBAXIN) 500 MG tablet Take 1 tablet (500 mg total) by mouth every 8 (eight) hours as needed for muscle spasms. 08/09/19   Lavonia Drafts, MD  methylPREDNISolone (MEDROL) 4 MG TBPK tablet 6 day taper; take as directed on package instructions 01/29/19   Mar Daring, PA-C  naproxen  (NAPROSYN) 500 MG tablet Take 1 tablet (500 mg total) by mouth 2 (two) times daily with a meal. 08/09/19   Lavonia Drafts, MD  promethazine (PHENERGAN) 12.5 MG tablet Take 1 tablet (12.5 mg total) by mouth every 8 (eight) hours as needed for nausea or vomiting. Patient not taking: Reported on 07/28/2019 06/02/19   Trinna Post, PA-C  simvastatin (ZOCOR) 20 MG tablet TAKE 1 TABLET AT BEDTIME (SCHEDULE OFFICE VISIT BEFORE ANY FUTURE REFILLS) 07/08/19   Mar Daring, PA-C  SUMAtriptan (IMITREX) 100 MG tablet Take 1 tablet (100 mg total) by mouth every 2 (two) hours as needed for migraine. May repeat in 2 hours if headache persists or recurs. Patient not taking: Reported on 07/28/2019 07/14/15   Mar Daring, PA-C     Allergies Sulfa antibiotics and Sulfur  History reviewed. No pertinent family history.  Social History Social History   Tobacco Use  . Smoking status: Never Smoker  . Smokeless tobacco: Never Used  Substance Use Topics  . Alcohol use: Yes    Alcohol/week: 3.0 standard drinks    Types: 3 Cans of beer per week    Comment: Occassional  . Drug use: No    Review of Systems  Constitutional: No fever/chills Eyes: No visual changes.  ENT: Neck pain as above Cardiovascular: no chest pain. Respiratory: no shortness of breath. Gastrointestinal:  No nausea, no vomiting.   Genitourinary: Negative for dysuria. Musculoskeletal: Negative for back pain. Skin: Negative for rash. Neurological: As above   ____________________________________________   PHYSICAL EXAM:  VITAL SIGNS: ED Triage Vitals  Enc Vitals Group     BP 08/09/19 0846 (!) 203/126     Pulse Rate 08/09/19 0846 88     Resp 08/09/19 0846 18     Temp 08/09/19 0846 98.1 F (36.7 C)     Temp Source 08/09/19 0846 Oral     SpO2 08/09/19 0846 98 %     Weight 08/09/19 0837 108.9 kg (240 lb)     Height 08/09/19 0837 1.727 m (5' 8" )     Head Circumference --      Peak Flow --      Pain Score  08/09/19 0837 7     Pain Loc --      Pain Edu? --      Excl. in [place]? --     Constitutional: Alert and oriented. No acute distress. Pleasant and interactive Eyes: Conjunctivae are normal.  Head: Atraumatic. Nose: No congestion/rhinnorhea. Mouth/Throat: Mucous membranes are moist.   Neck: Patient with point tenderness to the left trapezius insertion site.  Pain worsens when he rotates his head to the right.  No rash or abnormality Cardiovascular: Normal rate, regular rhythm.  Good peripheral circulation. Respiratory: Normal respiratory effort.  No retractions.  Gastrointestinal: Soft and nontender. No distention.    Musculoskeletal: No lower extremity tenderness nor edema.  Warm and well perfused Neurologic:  Normal speech and language. No gross focal neurologic deficits are appreciated.  Skin:  Skin is warm, dry and intact. No rash noted. Psychiatric: Mood and affect are normal.  Speech and behavior are normal.  ____________________________________________   LABS (all labs ordered are listed, but only abnormal results are displayed)  Labs Reviewed  BASIC METABOLIC PANEL - Abnormal; Notable for the following components:      Result Value   Glucose, Bld 109 (*)    All other components within normal limits  CBC  TROPONIN I (HIGH SENSITIVITY)  TROPONIN I (HIGH SENSITIVITY)   ____________________________________________  EKG  ED ECG REPORT I, Lavonia Drafts, the attending physician, personally viewed and interpreted this ECG.  Date: 08/09/2019  Rhythm: normal sinus rhythm QRS Axis: normal Intervals: normal ST/T Wave abnormalities: normal Narrative Interpretation: no evidence of acute ischemia  ____________________________________________  RADIOLOGY  Chest x-ray reviewed by me, unremarkable, no pneumothorax pneumonia or enlarged heart ____________________________________________   PROCEDURES  Procedure(s) performed: No  Procedures   Critical Care performed:  No ____________________________________________   INITIAL IMPRESSION / ASSESSMENT AND PLAN / ED COURSE  Pertinent labs & imaging results that were available during my care of the patient were reviewed by me and considered in my medical decision making (see chart for details).  Patient overall well-appearing and in no acute distress.  Point tenderness over the left trapezius insertion site with pain traveling along the left scalp consistent with tension headache.  Will treat with IM Toradol given musculoskeletal nature of this.  Lab tests are unremarkable.  Patient has no chest pain.  EKG is unremarkable.  Chest x-ray is benign.  Will discharge the patient on Naprosyn, Robaxin, and Vicodin only as needed    ____________________________________________   FINAL CLINICAL IMPRESSION(S) / ED DIAGNOSES  Final diagnoses:  Acute intractable tension-type headache  Muscle spasms of neck        Note:  This document was prepared using Dragon voice recognition software and may include unintentional dictation errors.   Lavonia Drafts, MD 08/09/19 1356

## 2019-08-10 NOTE — Progress Notes (Signed)
Complete physical exam   Patient: Joel Terry   DOB: July 21, 1965   54 y.o. Male  MRN: [id number] Visit Date: 08/12/2019  Today's healthcare provider: Mar Daring, PA-C   Chief Complaint  Patient presents with  . Annual Exam   Subjective    Joel Terry is a 54 y.o. male who presents today for a complete physical exam.  He reports consuming a general diet. The patient does not participate in regular exercise at present. He generally feels well. He reports sleeping poorly. He does have additional problems to discuss today. He reports that his blood pressure has been running high and he is not sleeping well. HPI  Reports that his blood pressure readings on 04/23 was between 190's-170's/110's-100's. He is currently on 41m of Lisinopril.  Patient was seen at the [facility] for neck pain and blood pressure. He was prescribed hydrocodone-Acetaminophen, Methocarbamol and naproxen. Patient has improved with the Methocarbamol. He is no longer taking the Hydrocodone-Acetaminophen.  Past Medical History:  Diagnosis Date  . Asthma   . Chronic kidney disease   . Headache   . History of kidney stones   . Hypertension   . Nephrolithiasis   . Obesity   . Ulcerative colitis ([facility]    Past Surgical History:  Procedure Laterality Date  . COLECTOMY    . COLONOSCOPY WITH PROPOFOL N/A 09/16/2014   Procedure: COLONOSCOPY WITH PROPOFOL;  Surgeon: MLollie Sails MD;  [facility]  Service: Endoscopy;  Laterality: N/A;  . FLEXIBLE SIGMOIDOSCOPY N/A 07/11/2017   Procedure: FLEXIBLE SIGMOIDOSCOPY;  Surgeon: SLollie Sails MD;  [facility];  Service: Endoscopy;  Laterality: N/A;  . FLEXIBLE SIGMOIDOSCOPY N/A 10/23/2017   Procedure: FLEXIBLE SIGMOIDOSCOPY;  Surgeon: SLollie Sails MD;  [facility];  Service: Endoscopy;  Laterality: N/A;  . FLEXIBLE SIGMOIDOSCOPY N/A 08/06/2019   Procedure: FLEXIBLE SIGMOIDOSCOPY;  Surgeon: AJonathon Bellows MD;   [facility];  Service: Gastroenterology;  Laterality: N/A;  . HERNIA REPAIR     Social History   Socioeconomic History  . Marital status: Married    Spouse name: Not on file  . Number of children: Not on file  . Years of education: Not on file  . Highest education level: Not on file  Occupational History  . Not on file  Tobacco Use  . Smoking status: Never Smoker  . Smokeless tobacco: Never Used  Substance and Sexual Activity  . Alcohol use: Yes    Alcohol/week: 3.0 standard drinks    Types: 3 Cans of beer per week    Comment: Occassional  . Drug use: No  . Sexual activity: Yes  Other Topics Concern  . Not on file  Social History Narrative  . Not on file   Social Determinants of Health   Financial Resource Strain:   . Difficulty of Paying Living Expenses:   Food Insecurity:   . Worried About RCharity fundraiserin the Last Year:   . RArboriculturistin the Last Year:   Transportation Needs:   . LFilm/video editor(Medical):   .Marland KitchenLack of Transportation (Non-Medical):   Physical Activity:   . Days of Exercise per Week:   . Minutes of Exercise per Session:   Stress:   . Feeling of Stress :   Social Connections:   . Frequency of Communication with Friends and Family:   . Frequency of Social Gatherings with Friends and Family:   . Attends Religious  Services:   . Active Member of Clubs or Organizations:   . Attends Archivist Meetings:   Marland Kitchen Marital Status:   Intimate Partner Violence:   . Fear of Current or Ex-Partner:   . Emotionally Abused:   Marland Kitchen Physically Abused:   . Sexually Abused:    Family Status  Relation Name Status  . Mother  Deceased at age 80       blood clot  . Father  Deceased at age 39  . Son  Alive   History reviewed. No pertinent family history. Allergies  Allergen Reactions  . Sulfa Antibiotics Hives    Increased HR and BP  . Sulfur Rash and Other (See Comments)    Elevated BP and heart rate    Patient Care  Team: Rubye Beach as PCP - General (Physician Assistant)   Medications: Outpatient Medications Prior to Visit  Medication Sig  . lisinopril (ZESTRIL) 20 MG tablet TAKE 1 TABLET DAILY  . methocarbamol (ROBAXIN) 500 MG tablet Take 1 tablet (500 mg total) by mouth every 8 (eight) hours as needed for muscle spasms.  . naproxen (NAPROSYN) 500 MG tablet Take 1 tablet (500 mg total) by mouth 2 (two) times daily with a meal.  . simvastatin (ZOCOR) 20 MG tablet TAKE 1 TABLET AT BEDTIME (SCHEDULE OFFICE VISIT BEFORE ANY FUTURE REFILLS)  . ALPRAZolam (XANAX) 0.25 MG tablet TAKE 1 TABLET THREE TIMES A DAY AS NEEDED FOR ANXIETY (Patient not taking: Reported on 08/12/2019)  . benzonatate (TESSALON) 200 MG capsule Take 1 capsule (200 mg total) by mouth 2 (two) times daily as needed for cough. (Patient not taking: Reported on 07/28/2019)  . cefdinir (OMNICEF) 300 MG capsule Take 1 capsule (300 mg total) by mouth 2 (two) times daily. (Patient not taking: Reported on 07/28/2019)  . citalopram (CELEXA) 20 MG tablet Take 1 tablet (20 mg total) by mouth daily. (Patient not taking: Reported on 07/28/2019)  . cyclobenzaprine (FLEXERIL) 5 MG tablet Take 1-2 tablets (5-10 mg total) by mouth 3 (three) times daily as needed for muscle spasms.  . fluticasone furoate-vilanterol (BREO ELLIPTA) 100-25 MCG/INH AEPB Inhale 1 puff into the lungs daily. (Patient not taking: Reported on 08/12/2019)  . HYDROcodone-acetaminophen (NORCO/VICODIN) 5-325 MG tablet Take 1 tablet by mouth every 6 (six) hours as needed for severe pain. (Patient not taking: Reported on 08/12/2019)  . methylPREDNISolone (MEDROL) 4 MG TBPK tablet 6 day taper; take as directed on package instructions  . promethazine (PHENERGAN) 12.5 MG tablet Take 1 tablet (12.5 mg total) by mouth every 8 (eight) hours as needed for nausea or vomiting. (Patient not taking: Reported on 07/28/2019)  . SUMAtriptan (IMITREX) 100 MG tablet Take 1 tablet (100 mg total) by  mouth every 2 (two) hours as needed for migraine. May repeat in 2 hours if headache persists or recurs. (Patient not taking: Reported on 07/28/2019)   No facility-administered medications prior to visit.    Review of Systems  Constitutional: Positive for fatigue.  HENT: Positive for congestion, sinus pressure, tinnitus and voice change.   Eyes: Negative.   Respiratory: Negative.   Cardiovascular: Negative.   Gastrointestinal: Negative.   Endocrine: Negative.   Genitourinary: Negative.   Musculoskeletal: Positive for neck pain and neck stiffness.  Skin: Negative.   Allergic/Immunologic: Negative.   Neurological: Positive for tremors and headaches.  Hematological: Negative.   Psychiatric/Behavioral: Positive for sleep disturbance.    Last CBC Lab Results  Component Value Date   WBC 6.7 08/09/2019  HGB 16.7 08/09/2019   HCT 47.9 08/09/2019   MCV 90.2 08/09/2019   MCH 31.5 08/09/2019   RDW 13.3 08/09/2019   PLT 388 40/98/1191   Last metabolic panel Lab Results  Component Value Date   GLUCOSE 109 (H) 08/09/2019   NA 138 08/09/2019   K 3.9 08/09/2019   CL 107 08/09/2019   CO2 24 08/09/2019   BUN 16 08/09/2019   CREATININE 1.08 08/09/2019   GFRNONAA >60 08/09/2019   GFRAA >60 08/09/2019   CALCIUM 9.1 08/09/2019   PROT 7.5 06/02/2019   ALBUMIN 4.2 06/02/2019   LABGLOB 2.6 07/14/2015   AGRATIO 1.5 07/14/2015   BILITOT 0.9 06/02/2019   ALKPHOS 65 06/02/2019   AST 24 06/02/2019   ALT 25 06/02/2019   ANIONGAP 7 08/09/2019      Objective    BP (!) 197/142 (BP Location: Left Wrist, Patient Position: Sitting, Cuff Size: Normal)   Pulse 76   Temp (!) 97.1 F (36.2 C) (Temporal)   Resp 16   Ht 5' 8"  (1.727 m)   Wt 237 lb 6.4 oz (107.7 kg)   BMI 36.10 kg/m  BP Readings from Last 3 Encounters:  08/12/19 (!) 197/142  08/09/19 (!) 210/142  08/06/19 (!) 168/105   Wt Readings from Last 3 Encounters:  08/12/19 237 lb 6.4 oz (107.7 kg)  08/09/19 240 lb (108.9 kg)   08/06/19 240 lb (108.9 kg)      Physical Exam Vitals reviewed.  Constitutional:      General: He is not in acute distress.    Appearance: Normal appearance. He is well-developed. He is obese. He is not ill-appearing.  HENT:     Head: Normocephalic and atraumatic.     Right Ear: Tympanic membrane, ear canal and external ear normal.     Left Ear: Tympanic membrane, ear canal and external ear normal.     Nose: Nose normal.     Mouth/Throat:     Mouth: Mucous membranes are moist.     Pharynx: Oropharynx is clear.  Eyes:     General: No scleral icterus.       Right eye: No discharge.        Left eye: No discharge.     Extraocular Movements: Extraocular movements intact.     Conjunctiva/sclera: Conjunctivae normal.     Pupils: Pupils are equal, round, and reactive to light.  Neck:     Thyroid: No thyromegaly.     Vascular: No carotid bruit.     Trachea: No tracheal deviation.  Cardiovascular:     Rate and Rhythm: Normal rate and regular rhythm.     Pulses: Normal pulses.     Heart sounds: Normal heart sounds. No murmur.  Pulmonary:     Effort: Pulmonary effort is normal. No respiratory distress.     Breath sounds: Normal breath sounds. No wheezing or rales.  Chest:     Chest wall: No tenderness.  Abdominal:     General: Abdomen is flat. Bowel sounds are normal. There is no distension.     Palpations: Abdomen is soft. There is no mass.     Tenderness: There is generalized abdominal tenderness. There is no guarding or rebound.  Musculoskeletal:        General: No tenderness. Normal range of motion.     Cervical back: Normal range of motion and neck supple.  Lymphadenopathy:     Cervical: No cervical adenopathy.  Skin:    General: Skin is warm and dry.  Capillary Refill: Capillary refill takes less than 2 seconds.     Findings: No erythema or rash.  Neurological:     General: No focal deficit present.     Mental Status: He is alert and oriented to person, place, and  time. Mental status is at baseline.     Cranial Nerves: No cranial nerve deficit.     Motor: No weakness or abnormal muscle tone.     Coordination: Coordination normal.     Gait: Gait normal.     Deep Tendon Reflexes: Reflexes are normal and symmetric.  Psychiatric:        Mood and Affect: Mood normal.        Behavior: Behavior normal.        Thought Content: Thought content normal.        Judgment: Judgment normal.      Depression Screen  PHQ 2/9 Scores 08/12/2019 12/23/2016 12/23/2016  PHQ - 2 Score 0 0 0  PHQ- 9 Score - 1 -    No results found for any visits on 08/12/19.  Assessment & Plan    Routine Health Maintenance and Physical Exam  Exercise Activities and Dietary recommendations Goals    . Exercise 150 minutes per week (moderate activity)       Immunization History  Administered Date(s) Administered  . Influenza,inj,Quad PF,6+ Mos 01/13/2015    Health Maintenance  Topic Date Due  . HIV Screening  Never done  . COVID-19 Vaccine (1) Never done  . TETANUS/TDAP  Never done  . INFLUENZA VACCINE  11/14/2019  . COLONOSCOPY  10/23/2020    Discussed health benefits of physical activity, and encouraged him to engage in regular exercise appropriate for his age and condition.  1. Annual physical exam Normal physical exam today. Will check labs as below and f/u pending lab results. If labs are stable and WNL he will not need to have these rechecked for one year at his next annual physical exam. He is to call the office in the meantime if he has any acute issue, questions or concerns.  2. Prostate cancer screening Will check labs as below and f/u pending results. - PSA  3. Migraine with aura and without status migrainosus, not intractable Stable. Diagnosis pulled for medication refill. Continue current medical treatment plan. - SUMAtriptan (IMITREX) 100 MG tablet; Take 1 tablet (100 mg total) by mouth every 2 (two) hours as needed for migraine. May repeat in 2 hours if  headache persists or recurs.  Dispense: 15 tablet; Refill: 3  4. Primary insomnia Will add trazodone as below to see if sleep improves. Stop unisom OTC. Will f/u in 4 weeks.  - traZODone (DESYREL) 50 MG tablet; Take 0.5-2 tablets (25-100 mg total) by mouth at bedtime as needed for sleep.  Dispense: 60 tablet; Refill: 0  5. Essential hypertension Elevated. Increase Lisinopril to 67m from 231m Will check labs as below and f/u pending results. - lisinopril (ZESTRIL) 40 MG tablet; Take 1 tablet (40 mg total) by mouth daily.  Dispense: 90 tablet; Refill: 3 - Hepatic function panel - HgB A1c - Lipid Panel With LDL/HDL Ratio  6. Benign prostatic hyperplasia with urinary obstruction Stable.   7. Pure hypercholesterolemia Controlled on Simvastatin 2068mWill check labs as below and f/u pending results. - Hepatic function panel - HgB A1c - Lipid Panel With LDL/HDL Ratio  8. Class 2 severe obesity due to excess calories with serious comorbidity and body mass index (BMI) of 36.0 to 36.9 in adult (  [place]) Counseled patient on healthy lifestyle modifications including dieting and exercise.  - Hepatic function panel - HgB A1c - Lipid Panel With LDL/HDL Ratio  9. Screening for HIV without presence of risk factors Will check labs as below and f/u pending results. - HIV antibody (with reflex)  10. Hepatic steatosis Noted on CT abd/pelvis recently done. Will check labs as below and f/u pending results. - Hepatic function panel - HgB A1c - Lipid Panel With LDL/HDL Ratio  11. Recurrent major depressive disorder, in partial remission (HCC) Stable. Off medications at this time.   12. Adenoma of pituitary (HCC) Stable. Last imaged in 2017 and not recommended for recheck as long as no symptoms.   No follow-ups on file.     Reynolds Bowl, PA-C, have reviewed all documentation for this visit. The documentation on 08/12/19 for the exam, diagnosis, procedures, and orders are all accurate  and complete.   Rubye Beach  [facility] [phone] (phone) [phone] (fax)  [place]

## 2019-08-12 ENCOUNTER — Encounter: Payer: Self-pay | Admitting: Gastroenterology

## 2019-08-12 ENCOUNTER — Ambulatory Visit (INDEPENDENT_AMBULATORY_CARE_PROVIDER_SITE_OTHER): Payer: BC Managed Care – PPO | Admitting: Physician Assistant

## 2019-08-12 ENCOUNTER — Encounter: Payer: Self-pay | Admitting: Physician Assistant

## 2019-08-12 ENCOUNTER — Other Ambulatory Visit: Payer: Self-pay

## 2019-08-12 VITALS — BP 197/142 | HR 76 | Temp 97.1°F | Resp 16 | Ht 68.0 in | Wt 237.4 lb

## 2019-08-12 DIAGNOSIS — G43109 Migraine with aura, not intractable, without status migrainosus: Secondary | ICD-10-CM | POA: Diagnosis not present

## 2019-08-12 DIAGNOSIS — Z0000 Encounter for general adult medical examination without abnormal findings: Secondary | ICD-10-CM | POA: Diagnosis not present

## 2019-08-12 DIAGNOSIS — F5101 Primary insomnia: Secondary | ICD-10-CM | POA: Diagnosis not present

## 2019-08-12 DIAGNOSIS — N138 Other obstructive and reflux uropathy: Secondary | ICD-10-CM

## 2019-08-12 DIAGNOSIS — Z114 Encounter for screening for human immunodeficiency virus [HIV]: Secondary | ICD-10-CM

## 2019-08-12 DIAGNOSIS — F3341 Major depressive disorder, recurrent, in partial remission: Secondary | ICD-10-CM | POA: Insufficient documentation

## 2019-08-12 DIAGNOSIS — E7800 Pure hypercholesterolemia, unspecified: Secondary | ICD-10-CM | POA: Diagnosis not present

## 2019-08-12 DIAGNOSIS — Z125 Encounter for screening for malignant neoplasm of prostate: Secondary | ICD-10-CM

## 2019-08-12 DIAGNOSIS — I10 Essential (primary) hypertension: Secondary | ICD-10-CM

## 2019-08-12 DIAGNOSIS — Z6836 Body mass index (BMI) 36.0-36.9, adult: Secondary | ICD-10-CM

## 2019-08-12 DIAGNOSIS — N401 Enlarged prostate with lower urinary tract symptoms: Secondary | ICD-10-CM

## 2019-08-12 DIAGNOSIS — K760 Fatty (change of) liver, not elsewhere classified: Secondary | ICD-10-CM | POA: Diagnosis not present

## 2019-08-12 DIAGNOSIS — D352 Benign neoplasm of pituitary gland: Secondary | ICD-10-CM

## 2019-08-12 MED ORDER — TRAZODONE HCL 50 MG PO TABS: 25.0000 mg | ORAL_TABLET | Freq: Every evening | ORAL | 0 refills | Status: DC | PRN

## 2019-08-12 MED ORDER — LISINOPRIL 40 MG PO TABS: 40.0000 mg | ORAL_TABLET | Freq: Every day | ORAL | 3 refills | Status: DC

## 2019-08-12 MED ORDER — SUMATRIPTAN SUCCINATE 100 MG PO TABS: 100.0000 mg | ORAL_TABLET | ORAL | 3 refills | Status: DC | PRN

## 2019-08-12 NOTE — Patient Instructions (Signed)
Health Maintenance, Male Adopting a healthy lifestyle and getting preventive care are important in promoting health and wellness. Ask your health care provider about:  The right schedule for you to have regular tests and exams.  Things you can do on your own to prevent diseases and keep yourself healthy. What should I know about diet, weight, and exercise? Eat a healthy diet   Eat a diet that includes plenty of vegetables, fruits, low-fat dairy products, and lean protein.  Do not eat a lot of foods that are high in solid fats, added sugars, or sodium. Maintain a healthy weight Body mass index (BMI) is a measurement that can be used to identify possible weight problems. It estimates body fat based on height and weight. Your health care provider can help determine your BMI and help you achieve or maintain a healthy weight. Get regular exercise Get regular exercise. This is one of the most important things you can do for your health. Most adults should:  Exercise for at least 150 minutes each week. The exercise should increase your heart rate and make you sweat (moderate-intensity exercise).  Do strengthening exercises at least twice a week. This is in addition to the moderate-intensity exercise.  Spend less time sitting. Even light physical activity can be beneficial. Watch cholesterol and blood lipids Have your blood tested for lipids and cholesterol at 52 years of age, then have this test every 5 years. You may need to have your cholesterol levels checked more often if:  Your lipid or cholesterol levels are high.  You are older than 52 years of age.  You are at high risk for heart disease. What should I know about cancer screening? Many types of cancers can be detected early and may often be prevented. Depending on your health history and family history, you may need to have cancer screening at various ages. This may include screening for:  Colorectal cancer.  Prostate  cancer.  Skin cancer.  Lung cancer. What should I know about heart disease, diabetes, and high blood pressure? Blood pressure and heart disease  High blood pressure causes heart disease and increases the risk of stroke. This is more likely to develop in people who have high blood pressure readings, are of African descent, or are overweight.  Talk with your health care provider about your target blood pressure readings.  Have your blood pressure checked: ? Every 3-5 years if you are 52-52 years of age. ? Every year if you are 52 years old or older.  If you are between the ages of 71 and 45 and are a current or former smoker, ask your health care provider if you should have a one-time screening for abdominal aortic aneurysm (AAA). Diabetes Have regular diabetes screenings. This checks your fasting blood sugar level. Have the screening done:  Once every three years after age 21 if you are at a normal weight and have a low risk for diabetes.  More often and at a younger age if you are overweight or have a high risk for diabetes. What should I know about preventing infection? Hepatitis B If you have a higher risk for hepatitis B, you should be screened for this virus. Talk with your health care provider to find out if you are at risk for hepatitis B infection. Hepatitis C Blood testing is recommended for:  Everyone born from [birth year] through 1963.  Anyone with known risk factors for hepatitis C. Sexually transmitted infections (STIs)  You should be screened each year  for STIs, including gonorrhea and chlamydia, if: ? You are sexually active and are younger than 52 years of age. ? You are older than 52 years of age and your health care provider tells you that you are at risk for this type of infection. ? Your sexual activity has changed since you were last screened, and you are at increased risk for chlamydia or gonorrhea. Ask your health care provider if you are at risk.  Ask your  health care provider about whether you are at high risk for HIV. Your health care provider may recommend a prescription medicine to help prevent HIV infection. If you choose to take medicine to prevent HIV, you should first get tested for HIV. You should then be tested every 3 months for as long as you are taking the medicine. Follow these instructions at home: Lifestyle  Do not use any products that contain nicotine or tobacco, such as cigarettes, e-cigarettes, and chewing tobacco. If you need help quitting, ask your health care provider.  Do not use street drugs.  Do not share needles.  Ask your health care provider for help if you need support or information about quitting drugs. Alcohol use  Do not drink alcohol if your health care provider tells you not to drink.  If you drink alcohol: ? Limit how much you have to 0-2 drinks a day. ? Be aware of how much alcohol is in your drink. In the [place] one drink equals one 12 oz bottle of beer (355 mL), one 5 oz glass of wine (148 mL), or one 1 oz glass of hard liquor (44 mL). General instructions  Schedule regular health, dental, and eye exams.  Stay current with your vaccines.  Tell your health care provider if: ? You often feel depressed. ? You have ever been abused or do not feel safe at home. Summary  Adopting a healthy lifestyle and getting preventive care are important in promoting health and wellness.  Follow your health care provider's instructions about healthy diet, exercising, and getting tested or screened for diseases.  Follow your health care provider's instructions on monitoring your cholesterol and blood pressure. This information is not intended to replace advice given to you by your health care provider. Make sure you discuss any questions you have with your health care provider. Document Revised: 03/25/2018 Document Reviewed: 03/25/2018 Elsevier Patient Education  [place]. Trazodone tablets What is  this medicine? TRAZODONE (TRAZ oh done) is used to treat depression. This medicine may be used for other purposes; ask your health care provider or pharmacist if you have questions. COMMON BRAND NAME(S): Desyrel What should I tell my health care provider before I take this medicine? They need to know if you have any of these conditions:  attempted suicide or thinking about it  bipolar disorder  bleeding problems  glaucoma  heart disease, or previous heart attack  irregular heart beat  kidney or liver disease  low levels of sodium in the blood  an unusual or allergic reaction to trazodone, other medicines, foods, dyes or preservatives  pregnant or trying to get pregnant  breast-feeding How should I use this medicine? Take this medicine by mouth with a glass of water. Follow the directions on the prescription label. Take this medicine shortly after a meal or a light snack. Take your medicine at regular intervals. Do not take your medicine more often than directed. Do not stop taking this medicine suddenly except upon the advice of your doctor. Stopping this  medicine too quickly may cause serious side effects or your condition may worsen. A special MedGuide will be given to you by the pharmacist with each prescription and refill. Be sure to read this information carefully each time. Talk to your pediatrician regarding the use of this medicine in children. Special care may be needed. Overdosage: If you think you have taken too much of this medicine contact a poison control center or emergency room at once. NOTE: This medicine is only for you. Do not share this medicine with others. What if I miss a dose? If you miss a dose, take it as soon as you can. If it is almost time for your next dose, take only that dose. Do not take double or extra doses. What may interact with this medicine? Do not take this medicine with any of the following medications:  certain medicines for fungal  infections like fluconazole, itraconazole, ketoconazole, posaconazole, voriconazole  cisapride  dronedarone  linezolid  MAOIs like Carbex, Eldepryl, Marplan, Nardil, and Parnate  mesoridazine  methylene blue (injected into a vein)  pimozide  saquinavir  thioridazine This medicine may also interact with the following medications:  alcohol  antiviral medicines for HIV or AIDS  aspirin and aspirin-like medicines  barbiturates like phenobarbital  certain medicines for blood pressure, heart disease, irregular heart beat  certain medicines for depression, anxiety, or psychotic disturbances  certain medicines for migraine headache like almotriptan, eletriptan, frovatriptan, naratriptan, rizatriptan, sumatriptan, zolmitriptan  certain medicines for seizures like carbamazepine and phenytoin  certain medicines for sleep  certain medicines that treat or prevent blood clots like dalteparin, enoxaparin, warfarin  digoxin  fentanyl  lithium  NSAIDS, medicines for pain and inflammation, like ibuprofen or naproxen  other medicines that prolong the QT interval (cause an abnormal heart rhythm) like dofetilide  rasagiline  supplements like St. John's wort, kava kava, valerian  tramadol  tryptophan This list may not describe all possible interactions. Give your health care provider a list of all the medicines, herbs, non-prescription drugs, or dietary supplements you use. Also tell them if you smoke, drink alcohol, or use illegal drugs. Some items may interact with your medicine. What should I watch for while using this medicine? Tell your doctor if your symptoms do not get better or if they get worse. Visit your doctor or health care professional for regular checks on your progress. Because it may take several weeks to see the full effects of this medicine, it is important to continue your treatment as prescribed by your doctor. Patients and their families should watch out  for new or worsening thoughts of suicide or depression. Also watch out for sudden changes in feelings such as feeling anxious, agitated, panicky, irritable, hostile, aggressive, impulsive, severely restless, overly excited and hyperactive, or not being able to sleep. If this happens, especially at the beginning of treatment or after a change in dose, call your health care professional. Dennis Bast may get drowsy or dizzy. Do not drive, use machinery, or do anything that needs mental alertness until you know how this medicine affects you. Do not stand or sit up quickly, especially if you are an older patient. This reduces the risk of dizzy or fainting spells. Alcohol may interfere with the effect of this medicine. Avoid alcoholic drinks. This medicine may cause dry eyes and blurred vision. If you wear contact lenses you may feel some discomfort. Lubricating drops may help. See your eye doctor if the problem does not go away or is severe. Your mouth  may get dry. Chewing sugarless gum, sucking hard candy and drinking plenty of water may help. Contact your doctor if the problem does not go away or is severe. What side effects may I notice from receiving this medicine? Side effects that you should report to your doctor or health care professional as soon as possible:  allergic reactions like skin rash, itching or hives, swelling of the face, lips, or tongue  elevated mood, decreased need for sleep, racing thoughts, impulsive behavior  confusion  fast, irregular heartbeat  feeling faint or lightheaded, falls  feeling agitated, angry, or irritable  loss of balance or coordination  painful or prolonged erections  restlessness, pacing, inability to keep still  suicidal thoughts or other mood changes  tremors  trouble sleeping  seizures  unusual bleeding or bruising Side effects that usually do not require medical attention (report to your doctor or health care professional if they continue or are  bothersome):  change in sex drive or performance  change in appetite or weight  constipation  headache  muscle aches or pains  nausea This list may not describe all possible side effects. Call your doctor for medical advice about side effects. You may report side effects to FDA at 1-800-FDA-1088. Where should I keep my medicine? Keep out of the reach of children. Store at room temperature between 15 and 30 degrees C (59 to 86 degrees F). Protect from light. Keep container tightly closed. Throw away any unused medicine after the expiration date. NOTE: This sheet is a summary. It may not cover all possible information. If you have questions about this medicine, talk to your doctor, pharmacist, or health care provider.  2020 Elsevier/Gold Standard (2018-03-24 11:46:46)

## 2019-08-13 ENCOUNTER — Telehealth: Payer: Self-pay

## 2019-08-13 LAB — LIPID PANEL WITH LDL/HDL RATIO
Cholesterol, Total: 137 mg/dL (ref 100–199)
HDL: 29 mg/dL — ABNORMAL LOW (ref >39)
LDL Chol Calc (NIH): 90 mg/dL (ref 0–99)
LDL/HDL Ratio: 3.1 ratio (ref 0.0–3.6)
Triglycerides: 96 mg/dL (ref 0–149)
VLDL Cholesterol Cal: 18 mg/dL (ref 5–40)

## 2019-08-13 LAB — HEPATIC FUNCTION PANEL
ALT: 85 IU/L — ABNORMAL HIGH (ref 0–44)
AST: 60 IU/L — ABNORMAL HIGH (ref 0–40)
Albumin: 4.4 g/dL (ref 3.8–4.9)
Alkaline Phosphatase: 74 IU/L (ref 39–117)
Bilirubin Total: 0.4 mg/dL (ref 0.0–1.2)
Bilirubin, Direct: 0.11 mg/dL (ref 0.00–0.40)
Total Protein: 6.9 g/dL (ref 6.0–8.5)

## 2019-08-13 LAB — HEMOGLOBIN A1C
Est. average glucose Bld gHb Est-mCnc: 108 mg/dL
Hgb A1c MFr Bld: 5.4 % (ref 4.8–5.6)

## 2019-08-13 LAB — HIV ANTIBODY (ROUTINE TESTING W REFLEX): HIV Screen 4th Generation wRfx: NONREACTIVE

## 2019-08-13 LAB — PSA: Prostate Specific Ag, Serum: 0.5 ng/mL (ref 0.0–4.0)

## 2019-08-13 NOTE — Telephone Encounter (Signed)
Left Message to call back or to view message in his my chart. If patient calls back OK for [facility] nurse to give results.

## 2019-08-13 NOTE — Telephone Encounter (Signed)
-----   Message from Mar Daring, [place] sent at 08/13/2019  9:24 AM EDT ----- Liver enzymes have increased compared to last year. Limit fatty foods, red meats, alcohol and any tylenol (acetaminophen) based products. We should recheck in 6 weeks. A1c is normal. HIV screen done once in a lifetime, unless exposed, is negative. Cholesterol is normal. PSA is normal.

## 2019-08-13 NOTE — Telephone Encounter (Signed)
Seen by patient Joel Terry on 08/13/2019 3:36 PM EDT

## 2019-09-09 ENCOUNTER — Encounter: Payer: Self-pay | Admitting: Physician Assistant

## 2019-09-09 ENCOUNTER — Ambulatory Visit (INDEPENDENT_AMBULATORY_CARE_PROVIDER_SITE_OTHER): Payer: BC Managed Care – PPO | Admitting: Physician Assistant

## 2019-09-09 VITALS — BP 148/94 | HR 96 | Temp 97.3°F | Resp 16 | Wt 238.0 lb

## 2019-09-09 DIAGNOSIS — Z23 Encounter for immunization: Secondary | ICD-10-CM | POA: Diagnosis not present

## 2019-09-09 DIAGNOSIS — F5101 Primary insomnia: Secondary | ICD-10-CM

## 2019-09-09 DIAGNOSIS — I10 Essential (primary) hypertension: Secondary | ICD-10-CM | POA: Diagnosis not present

## 2019-09-09 NOTE — Patient Instructions (Signed)
Eszopiclone tablets What is this medicine? ESZOPICLONE (es ZOE pi clone) is used to treat insomnia. This medicine helps you to fall asleep and sleep through the night. This medicine may be used for other purposes; ask your health care provider or pharmacist if you have questions. COMMON BRAND NAME(S): Lunesta What should I tell my health care provider before I take this medicine? They need to know if you have any of these conditions:  depression  history of a drug or alcohol abuse problem  liver disease  lung or breathing disease  sleep-walking, driving, eating or other activity while not fully awake after taking a sleep medicine  suicidal thoughts  an unusual or allergic reaction to eszopiclone, other medicines, foods, dyes, or preservatives  pregnant or trying to get pregnant  breast-feeding How should I use this medicine? Take this medicine by mouth with a glass of water. Follow the directions on the prescription label. It is better to take this medicine on an empty stomach and only when you are ready for bed. Do not take your medicine more often than directed. If you have been taking this medicine for several weeks and suddenly stop taking it, you may get unpleasant withdrawal symptoms. Your doctor or health care professional may want to gradually reduce the dose. Do not stop taking this medicine on your own. Always follow your doctor or health care professional's advice. Talk to your pediatrician regarding the use of this medicine in children. Special care may be needed. Overdosage: If you think you have taken too much of this medicine contact a poison control center or emergency room at once. NOTE: This medicine is only for you. Do not share this medicine with others. What if I miss a dose? This does not apply. This medicine should only be taken immediately before going to sleep. Do not take double or extra doses. What may interact with this medicine?  herbal medicines like  kava kava, melatonin, St. John's wort and valerian  lorazepam  medicines for fungal infections like ketoconazole, fluconazole, or itraconazole  olanzapine This list may not describe all possible interactions. Give your health care provider a list of all the medicines, herbs, non-prescription drugs, or dietary supplements you use. Also tell them if you smoke, drink alcohol, or use illegal drugs. Some items may interact with your medicine. What should I watch for while using this medicine? Visit your doctor or health care professional for regular checks on your progress. Keep a regular sleep schedule by going to bed at about the same time nightly. Avoid caffeine-containing drinks in the evening hours, as caffeine can cause trouble with falling asleep. Talk to your doctor if you still have trouble sleeping. After taking this medicine, you may get up out of bed and do an activity that you do not know you are doing. The next morning, you may have no memory of this. Activities include driving a car ("sleep-driving"), making and eating food, talking on the phone, sexual activity, and sleep-walking. Serious injuries have occurred. Stop the medicine and call your doctor right away if you find out you have done any of these activities. Do not take this medicine if you have used alcohol that evening. Do not take it if you have taken another medicine for sleep. The risk of doing these sleep-related activities is higher. Do not take this medicine unless you are able to stay in bed for a full night (7 to 8 hours) before you must be active again. You may have a  decrease in mental alertness the day after use, even if you feel that you are fully awake. Tell your doctor if you will need to perform activities requiring full alertness, such as driving, the next day. Do not stand or sit up quickly after taking this medicine, especially if you are an older patient. This reduces the risk of dizzy or fainting spells. If you or  your family notice any changes in your behavior, such as new or worsening depression, thoughts of harming yourself, anxiety, other unusual or disturbing thoughts, or memory loss, call your doctor right away. After you stop taking this medicine, you may have trouble falling asleep. This is called rebound insomnia. This problem usually goes away on its own after 1 or 2 nights. What side effects may I notice from receiving this medicine? Side effects that you should report to your doctor or health care professional as soon as possible:  allergic reactions like skin rash, itching or hives, swelling of the face, lips, or tongue  changes in vision  confusion  depressed mood  feeling faint or lightheaded, falls  hallucinations  problems with balance, speaking, walking  restlessness, excitability, or feelings of agitation  unusual activities while not fully awake like driving, eating, making phone calls Side effects that usually do not require medical attention (report to your doctor or health care professional if they continue or are bothersome):  dizziness, or daytime drowsiness, sometimes called a hangover effect  headache This list may not describe all possible side effects. Call your doctor for medical advice about side effects. You may report side effects to FDA at 1-800-FDA-1088. Where should I keep my medicine? Keep out of the reach of children. This medicine can be abused. Keep your medicine in a safe place to protect it from theft. Do not share this medicine with anyone. Selling or giving away this medicine is dangerous and against the law. This medicine may cause accidental overdose and death if taken by other adults, children, or pets. Mix any unused medicine with a substance like cat litter or coffee grounds. Then throw the medicine away in a sealed container like a sealed bag or a coffee can with a lid. Do not use the medicine after the expiration date. Store at room temperature  between 15 and 30 degrees C (59 and 86 degrees F). NOTE: This sheet is a summary. It may not cover all possible information. If you have questions about this medicine, talk to your doctor, pharmacist, or health care provider.  2020 Elsevier/Gold Standard (2017-09-26 11:57:05)

## 2019-09-09 NOTE — Progress Notes (Signed)
Established patient visit   Patient: Joel Terry   DOB: 1965-04-28   54 y.o. Male  MRN: [id number] Visit Date: 09/09/2019  Today's healthcare provider: Mar Daring, PA-C   Chief Complaint  Patient presents with  . Follow-up    Insomnia and HTN   Subjective    HPI  Follow up for Insomnia  The patient was last seen for this 4 weeks ago. Changes made at last visit include Will add trazodone to see if sleep improves. Stop unisom OTC  He reports excellent compliance with treatment. He feels that condition is Improved. He is not having side effects. Reports that he still having trouble staying asleep. He is sleeping 4-5 hours with 2 tablets of the Trazodone. -----------------------------------------------------------------------------------------  Hypertension, follow-up  BP Readings from Last 3 Encounters:  09/09/19 (!) 148/94  08/12/19 (!) 197/142  08/09/19 (!) 210/142   Wt Readings from Last 3 Encounters:  09/09/19 238 lb (108 kg)  08/12/19 237 lb 6.4 oz (107.7 kg)  08/09/19 240 lb (108.9 kg)     He was last seen for hypertension 4 weeks ago.  BP at that visit was 197/142. Management since that visit includes Increase Lisinopril to 58m from 257mPatient had been taking his atorvastatin twice and had not been taking lisinopril at last visit. Now taking lisinopril 2071mut did not increase to 18m44m He reports good compliance with treatment. He is not having side effects.  He is following a Regular diet. He is exercising. He does not smoke.  Symptoms: No chest pain No chest pressure  No palpitations No syncope  No dyspnea No orthopnea  No paroxysmal nocturnal dyspnea No lower extremity edema   Pertinent labs: Lab Results  Component Value Date   CHOL 137 08/12/2019   HDL 29 (L) 08/12/2019   LDLCALC 90 08/12/2019   TRIG 96 08/12/2019   CHOLHDL 4.1 01/10/2017   Lab Results  Component Value Date   NA 138 08/09/2019   K 3.9 08/09/2019   CREATININE 1.08 08/09/2019   GFRNONAA >60 08/09/2019   GFRAA >60 08/09/2019   GLUCOSE 109 (H) 08/09/2019     The 10-year ASCVD risk score (GofMikey BussingJr., et al., 2013) is: 6%   ---------------------------------------------------------------------------------------------------   Patient Active Problem List   Diagnosis Date Noted  . Recurrent major depressive disorder, in partial remission (HCC)[place]/29/2021  . Asthma 06/10/2018  . Incomplete emptying of bladder 12/01/2015  . 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  . H/O renal calculi 04/26/2015  . Benign prostatic hyperplasia with urinary obstruction 04/26/2015  . Migraines 01/13/2015  . Ulcerative colitis (HCC)[place]/30/2016  . 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 Allergic rhinitis 12/16/2013  . Benign essential HTN 12/16/2013  . HLD (hyperlipidemia) 12/16/2013   Past Medical History:  Diagnosis Date  . Asthma   . Chronic kidney disease   . Headache   . History of kidney stones   . Hypertension   . Nephrolithiasis   . Obesity   . Ulcerative colitis (HCC)[place]     Medications: Outpatient Medications Prior to Visit  Medication Sig  . ALPRAZolam (XANAX) 0.25 MG tablet TAKE 1 TABLET THREE TIMES A DAY AS NEEDED FOR ANXIETY (Patient not taking: Reported on 08/12/2019)  . fluticasone furoate-vilanterol (BREO ELLIPTA) 100-25 MCG/INH AEPB Inhale 1 puff into the lungs daily. (Patient not taking: Reported on 08/12/2019)  . lisinopril (ZESTRIL) 40 MG tablet Take 1 tablet (40 mg total) by  mouth daily.  . methocarbamol (ROBAXIN) 500 MG tablet Take 1 tablet (500 mg total) by mouth every 8 (eight) hours as needed for muscle spasms.  . naproxen (NAPROSYN) 500 MG tablet Take 1 tablet (500 mg total) by mouth 2 (two) times daily with a meal.  . simvastatin (ZOCOR) 20 MG tablet TAKE 1 TABLET AT BEDTIME (SCHEDULE OFFICE VISIT BEFORE ANY FUTURE REFILLS)  . SUMAtriptan (IMITREX) 100 MG tablet Take 1 tablet (100 mg total) by mouth  every 2 (two) hours as needed for migraine. May repeat in 2 hours if headache persists or recurs.  . [DISCONTINUED] traZODone (DESYREL) 50 MG tablet Take 0.5-2 tablets (25-100 mg total) by mouth at bedtime as needed for sleep.   No facility-administered medications prior to visit.    Review of Systems  Constitutional: Negative.   Eyes: Negative for visual disturbance.  Respiratory: Negative for chest tightness, shortness of breath and wheezing.   Cardiovascular: Negative for chest pain, palpitations and leg swelling.  Neurological: Negative for weakness, light-headedness and headaches.  Psychiatric/Behavioral: Positive for sleep disturbance.    Last CBC Lab Results  Component Value Date   WBC 6.7 08/09/2019   HGB 16.7 08/09/2019   HCT 47.9 08/09/2019   MCV 90.2 08/09/2019   MCH 31.5 08/09/2019   RDW 13.3 08/09/2019   PLT 388 78/67/5449   Last metabolic panel Lab Results  Component Value Date   GLUCOSE 109 (H) 08/09/2019   NA 138 08/09/2019   K 3.9 08/09/2019   CL 107 08/09/2019   CO2 24 08/09/2019   BUN 16 08/09/2019   CREATININE 1.08 08/09/2019   GFRNONAA >60 08/09/2019   GFRAA >60 08/09/2019   CALCIUM 9.1 08/09/2019   PROT 6.9 08/12/2019   ALBUMIN 4.4 08/12/2019   LABGLOB 2.6 07/14/2015   AGRATIO 1.5 07/14/2015   BILITOT 0.4 08/12/2019   ALKPHOS 74 08/12/2019   AST 60 (H) 08/12/2019   ALT 85 (H) 08/12/2019   ANIONGAP 7 08/09/2019   Last lipids Lab Results  Component Value Date   CHOL 137 08/12/2019   HDL 29 (L) 08/12/2019   LDLCALC 90 08/12/2019   TRIG 96 08/12/2019   CHOLHDL 4.1 01/10/2017      Objective    BP (!) 148/94 (BP Location: Left Arm, Patient Position: Sitting, Cuff Size: Large)   Pulse 96   Temp (!) 97.3 F (36.3 C) (Temporal)   Resp 16   Wt 238 lb (108 kg)   BMI 36.19 kg/m  BP Readings from Last 3 Encounters:  09/09/19 (!) 148/94  08/12/19 (!) 197/142  08/09/19 (!) 210/142   Wt Readings from Last 3 Encounters:  09/09/19 238 lb  (108 kg)  08/12/19 237 lb 6.4 oz (107.7 kg)  08/09/19 240 lb (108.9 kg)      Physical Exam Vitals reviewed.  Constitutional:      General: He is not in acute distress.    Appearance: Normal appearance. He is well-developed. He is obese. He is not ill-appearing or diaphoretic.  HENT:     Head: Normocephalic and atraumatic.  Cardiovascular:     Rate and Rhythm: Normal rate and regular rhythm.     Pulses: Normal pulses.     Heart sounds: Normal heart sounds. No murmur. No friction rub. No gallop.   Pulmonary:     Effort: Pulmonary effort is normal. No respiratory distress.     Breath sounds: Normal breath sounds. No wheezing or rales.  Musculoskeletal:     Cervical back: Normal range of  motion and neck supple.  Neurological:     Mental Status: He is alert.       No results found for any visits on 09/09/19.  Assessment & Plan     1. Primary insomnia Stop trazodone and change to [facility] as below. F/U in 2-4 weeks.  - Eszopiclone 3 MG TABS; Take 1 tablet (3 mg total) by mouth at bedtime. Take immediately before bedtime  Dispense: 30 tablet; Refill: 0  2. Essential hypertension Increase lisinopril to 44m as discussed. Will recheck BP in 4 weeks. Virtual ok.   3. Need for diphtheria-tetanus-pertussis (Tdap) vaccine Tdap Vaccine given to patient without complications. Patient sat for 15 minutes after administration and was tolerated well without adverse effects. - Tdap vaccine greater than or equal to 7yo IM  Return in about 4 weeks (around 10/07/2019) for htn.      IReynolds Bowl PA-C, have reviewed all documentation for this visit. The documentation on 09/10/19 for the exam, diagnosis, procedures, and orders are all accurate and complete.   JRubye Beach BAl[facility] [phone](phone) [phone](fax)  CMiller

## 2019-09-10 MED ORDER — ESZOPICLONE 3 MG PO TABS: 3.0000 mg | ORAL_TABLET | Freq: Every day | ORAL | 0 refills | Status: DC

## 2019-10-11 ENCOUNTER — Encounter: Payer: Self-pay | Admitting: Physician Assistant

## 2019-10-11 ENCOUNTER — Other Ambulatory Visit: Payer: Self-pay

## 2019-10-11 ENCOUNTER — Telehealth (INDEPENDENT_AMBULATORY_CARE_PROVIDER_SITE_OTHER): Payer: BC Managed Care – PPO | Admitting: Physician Assistant

## 2019-10-11 VITALS — BP 138/70

## 2019-10-11 DIAGNOSIS — F5101 Primary insomnia: Secondary | ICD-10-CM

## 2019-10-11 DIAGNOSIS — I10 Essential (primary) hypertension: Secondary | ICD-10-CM | POA: Diagnosis not present

## 2019-10-11 MED ORDER — ESZOPICLONE 3 MG PO TABS: 3.0000 mg | ORAL_TABLET | Freq: Every day | ORAL | 1 refills | Status: DC

## 2019-10-11 NOTE — Progress Notes (Signed)
MyChart Video Visit    Virtual Visit via Video Note   This visit type was conducted due to national recommendations for restrictions regarding the COVID-19 Pandemic (e.g. social distancing) in an effort to limit this patient's exposure and mitigate transmission in our community. This patient is at least at moderate risk for complications without adequate follow up. This format is felt to be most appropriate for this patient at this time. Physical exam was limited by quality of the video and audio technology used for the visit.   Patient location: work Provider location: [facility]   Patient: Joel Terry   DOB: [birth year]   54 y.o. Male  MRN: [id number] Visit Date: 10/11/2019  Today's healthcare provider: Mar Daring, PA-C   Chief Complaint  Patient presents with  . Follow-up   Subjective    HPI Joel Terry is a 54 yr old male following up on BP and insomnia. At last visit one month ago he was changed from Trazodone to [place]. He reports excellent compliance and is now sleeping through the night without issue. Does not have any daytime drowsiness the following day.   He is also following up on HTN. At last visit his lisinopril was increased to 19m. He reports excellent compliance. He is taking 2-246mtabs at this time. Denies any adverse effects. BP is much improved.   Patient Active Problem List   Diagnosis Date Noted  . Recurrent major depressive disorder, in partial remission ([place]/29/2021  . Asthma 06/10/2018  . Incomplete emptying of bladder 12/01/2015  . 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  . H/O renal calculi 04/26/2015  . Benign prostatic hyperplasia with urinary obstruction 04/26/2015  . Migraines 01/13/2015  . Ulcerative colitis ([facility] [place]09/30/2016  . Adenoma of pituitary (HC[place]03/06/2014  . Allergic rhinitis 12/16/2013  . Benign essential HTN 12/16/2013  . HLD (hyperlipidemia) 12/16/2013   Past Medical History:  Diagnosis Date  .  Asthma   . Chronic kidney disease   . Headache   . History of kidney stones   . Hypertension   . Nephrolithiasis   . Obesity   . Ulcerative colitis (HCAnderson      Medications: Outpatient Medications Prior to Visit  Medication Sig  . ALPRAZolam (XANAX) 0.25 MG tablet TAKE 1 TABLET THREE TIMES A DAY AS NEEDED FOR ANXIETY (Patient not taking: Reported on 08/12/2019)  . Eszopiclone 3 MG TABS Take 1 tablet (3 mg total) by mouth at bedtime. Take immediately before bedtime  . fluticasone furoate-vilanterol (BREO ELLIPTA) 100-25 MCG/INH AEPB Inhale 1 puff into the lungs daily. (Patient not taking: Reported on 08/12/2019)  . lisinopril (ZESTRIL) 40 MG tablet Take 1 tablet (40 mg total) by mouth daily.  . methocarbamol (ROBAXIN) 500 MG tablet Take 1 tablet (500 mg total) by mouth every 8 (eight) hours as needed for muscle spasms.  . naproxen (NAPROSYN) 500 MG tablet Take 1 tablet (500 mg total) by mouth 2 (two) times daily with a meal.  . simvastatin (ZOCOR) 20 MG tablet TAKE 1 TABLET AT BEDTIME (SCHEDULE OFFICE VISIT BEFORE ANY FUTURE REFILLS)  . SUMAtriptan (IMITREX) 100 MG tablet Take 1 tablet (100 mg total) by mouth every 2 (two) hours as needed for migraine. May repeat in 2 hours if headache persists or recurs.   No facility-administered medications prior to visit.    Review of Systems  Last CBC Lab Results  Component Value Date   WBC 6.7 08/09/2019   HGB 16.7 08/09/2019  HCT 47.9 08/09/2019   MCV 90.2 08/09/2019   MCH 31.5 08/09/2019   RDW 13.3 08/09/2019   PLT 388 98/33/8250   Last metabolic panel Lab Results  Component Value Date   GLUCOSE 109 (H) 08/09/2019   NA 138 08/09/2019   K 3.9 08/09/2019   CL 107 08/09/2019   CO2 24 08/09/2019   BUN 16 08/09/2019   CREATININE 1.08 08/09/2019   GFRNONAA >60 08/09/2019   GFRAA >60 08/09/2019   CALCIUM 9.1 08/09/2019   PROT 6.9 08/12/2019   ALBUMIN 4.4 08/12/2019   LABGLOB 2.6 07/14/2015   AGRATIO 1.5 07/14/2015   BILITOT 0.4  08/12/2019   ALKPHOS 74 08/12/2019   AST 60 (H) 08/12/2019   ALT 85 (H) 08/12/2019   ANIONGAP 7 08/09/2019   Last lipids Lab Results  Component Value Date   CHOL 137 08/12/2019   HDL 29 (L) 08/12/2019   LDLCALC 90 08/12/2019   TRIG 96 08/12/2019   CHOLHDL 4.1 01/10/2017   Last hemoglobin A1c Lab Results  Component Value Date   HGBA1C 5.4 08/12/2019      Objective    There were no vitals taken for this visit. BP Readings from Last 3 Encounters:  10/11/19 138/70  09/09/19 (!) 148/94  08/12/19 (!) 197/142   Wt Readings from Last 3 Encounters:  09/09/19 238 lb (108 kg)  08/12/19 237 lb 6.4 oz (107.7 kg)  08/09/19 240 lb (108.9 kg)      Physical Exam Vitals reviewed.  Constitutional:      General: He is not in acute distress.    Appearance: Normal appearance. He is well-developed. He is not ill-appearing.  HENT:     Head: Normocephalic and atraumatic.  Eyes:     Conjunctiva/sclera: Conjunctivae normal.  Pulmonary:     Effort: Pulmonary effort is normal. No respiratory distress.  Musculoskeletal:     Cervical back: Normal range of motion and neck supple.  Neurological:     Mental Status: He is alert.  Psychiatric:        Mood and Affect: Mood normal.        Behavior: Behavior normal.        Thought Content: Thought content normal.        Judgment: Judgment normal.       Assessment & Plan     1. Primary insomnia Improved. Continue Lunesta as below. F/U in April 2022 for CPE.  - Eszopiclone 3 MG TABS; Take 1 tablet (3 mg total) by mouth at bedtime. Take immediately before bedtime  Dispense: 90 tablet; Refill: 1  2. Benign essential HTN Improved with Lisinopril 45m. Continue. Call if BP increases again. F/U in April 2022 for CPE.    No follow-ups on file.     I discussed the assessment and treatment plan with the patient. The patient was provided an opportunity to ask questions and all were answered. The patient agreed with the plan and demonstrated an  understanding of the instructions.   The patient was advised to call back or seek an in-person evaluation if the symptoms worsen or if the condition fails to improve as anticipated.  I provided 6 minutes of non-face-to-face time during this encounter.  IReynolds Bowl PA-C, have reviewed all documentation for this visit. The documentation on 10/11/19 for the exam, diagnosis, procedures, and orders are all accurate and complete.  JRubye BeachBWellstar West Georgia Medical [phone](phone) [phone](fax)  CAdrian

## 2019-10-11 NOTE — Patient Instructions (Signed)
DASH Eating Plan DASH stands for "Dietary Approaches to Stop Hypertension." The DASH eating plan is a healthy eating plan that has been shown to reduce high blood pressure (hypertension). It may also reduce your risk for type 2 diabetes, heart disease, and stroke. The DASH eating plan may also help with weight loss. What are tips for following this plan?  General guidelines  Avoid eating more than 2,300 mg (milligrams) of salt (sodium) a day. If you have hypertension, you may need to reduce your sodium intake to 1,500 mg a day.  Limit alcohol intake to no more than 1 drink a day for nonpregnant women and 2 drinks a day for men. One drink equals 12 oz of beer, 5 oz of wine, or 1 oz of hard liquor.  Work with your health care provider to maintain a healthy body weight or to lose weight. Ask what an ideal weight is for you.  Get at least 30 minutes of exercise that causes your heart to beat faster (aerobic exercise) most days of the week. Activities may include walking, swimming, or biking.  Work with your health care provider or diet and nutrition specialist (dietitian) to adjust your eating plan to your individual calorie needs. Reading food labels   Check food labels for the amount of sodium per serving. Choose foods with less than 5 percent of the Daily Value of sodium. Generally, foods with less than 300 mg of sodium per serving fit into this eating plan.  To find whole grains, look for the word "whole" as the first word in the ingredient list. Shopping  Buy products labeled as "low-sodium" or "no salt added."  Buy fresh foods. Avoid canned foods and premade or frozen meals. Cooking  Avoid adding salt when cooking. Use salt-free seasonings or herbs instead of table salt or sea salt. Check with your health care provider or pharmacist before using salt substitutes.  Do not fry foods. Cook foods using healthy methods such as baking, boiling, grilling, and broiling instead.  Cook with  heart-healthy oils, such as olive, canola, soybean, or sunflower oil. Meal planning  Eat a balanced diet that includes: ? 5 or more servings of fruits and vegetables each day. At each meal, try to fill half of your plate with fruits and vegetables. ? Up to 6-8 servings of whole grains each day. ? Less than 6 oz of lean meat, poultry, or fish each day. A 3-oz serving of meat is about the same size as a deck of cards. One egg equals 1 oz. ? 2 servings of low-fat dairy each day. ? A serving of nuts, seeds, or beans 5 times each week. ? Heart-healthy fats. Healthy fats called Omega-3 fatty acids are found in foods such as flaxseeds and coldwater fish, like sardines, salmon, and mackerel.  Limit how much you eat of the following: ? Canned or prepackaged foods. ? Food that is high in trans fat, such as fried foods. ? Food that is high in saturated fat, such as fatty meat. ? Sweets, desserts, sugary drinks, and other foods with added sugar. ? Full-fat dairy products.  Do not salt foods before eating.  Try to eat at least 2 vegetarian meals each week.  Eat more home-cooked food and less restaurant, buffet, and fast food.  When eating at a restaurant, ask that your food be prepared with less salt or no salt, if possible. What foods are recommended? The items listed may not be a complete list. Talk with your dietitian about   what dietary choices are best for you. Grains Whole-grain or whole-wheat bread. Whole-grain or whole-wheat pasta. Brown rice. Oatmeal. Quinoa. Bulgur. Whole-grain and low-sodium cereals. Pita bread. Low-fat, low-sodium crackers. Whole-wheat flour tortillas. Vegetables Fresh or frozen vegetables (raw, steamed, roasted, or grilled). Low-sodium or reduced-sodium tomato and vegetable juice. Low-sodium or reduced-sodium tomato sauce and tomato paste. Low-sodium or reduced-sodium canned vegetables. Fruits All fresh, dried, or frozen fruit. Canned fruit in natural juice (without  added sugar). Meat and other protein foods Skinless chicken or turkey. Ground chicken or turkey. Pork with fat trimmed off. Fish and seafood. Egg whites. Dried beans, peas, or lentils. Unsalted nuts, nut butters, and seeds. Unsalted canned beans. Lean cuts of beef with fat trimmed off. Low-sodium, lean deli meat. Dairy Low-fat (1%) or fat-free (skim) milk. Fat-free, low-fat, or reduced-fat cheeses. Nonfat, low-sodium ricotta or cottage cheese. Low-fat or nonfat yogurt. Low-fat, low-sodium cheese. Fats and oils Soft margarine without trans fats. Vegetable oil. Low-fat, reduced-fat, or light mayonnaise and salad dressings (reduced-sodium). Canola, safflower, olive, soybean, and sunflower oils. Avocado. Seasoning and other foods Herbs. Spices. Seasoning mixes without salt. Unsalted popcorn and pretzels. Fat-free sweets. What foods are not recommended? The items listed may not be a complete list. Talk with your dietitian about what dietary choices are best for you. Grains Baked goods made with fat, such as croissants, muffins, or some breads. Dry pasta or rice meal packs. Vegetables Creamed or fried vegetables. Vegetables in a cheese sauce. Regular canned vegetables (not low-sodium or reduced-sodium). Regular canned tomato sauce and paste (not low-sodium or reduced-sodium). Regular tomato and vegetable juice (not low-sodium or reduced-sodium). Pickles. Olives. Fruits Canned fruit in a light or heavy syrup. Fried fruit. Fruit in cream or butter sauce. Meat and other protein foods Fatty cuts of meat. Ribs. Fried meat. Bacon. Sausage. Bologna and other processed lunch meats. Salami. Fatback. Hotdogs. Bratwurst. Salted nuts and seeds. Canned beans with added salt. Canned or smoked fish. Whole eggs or egg yolks. Chicken or turkey with skin. Dairy Whole or 2% milk, cream, and half-and-half. Whole or full-fat cream cheese. Whole-fat or sweetened yogurt. Full-fat cheese. Nondairy creamers. Whipped toppings.  Processed cheese and cheese spreads. Fats and oils Butter. Stick margarine. Lard. Shortening. Ghee. Bacon fat. Tropical oils, such as coconut, palm kernel, or palm oil. Seasoning and other foods Salted popcorn and pretzels. Onion salt, garlic salt, seasoned salt, table salt, and sea salt. Worcestershire sauce. Tartar sauce. Barbecue sauce. Teriyaki sauce. Soy sauce, including reduced-sodium. Steak sauce. Canned and packaged gravies. Fish sauce. Oyster sauce. Cocktail sauce. Horseradish that you find on the shelf. Ketchup. Mustard. Meat flavorings and tenderizers. Bouillon cubes. Hot sauce and Tabasco sauce. Premade or packaged marinades. Premade or packaged taco seasonings. Relishes. Regular salad dressings. Where to find more information:  National Heart, Lung, and Blood Institute: www.nhlbi.nih.gov  American Heart Association: www.heart.org Summary  The DASH eating plan is a healthy eating plan that has been shown to reduce high blood pressure (hypertension). It may also reduce your risk for type 2 diabetes, heart disease, and stroke.  With the DASH eating plan, you should limit salt (sodium) intake to 2,300 mg a day. If you have hypertension, you may need to reduce your sodium intake to 1,500 mg a day.  When on the DASH eating plan, aim to eat more fresh fruits and vegetables, whole grains, lean proteins, low-fat dairy, and heart-healthy fats.  Work with your health care provider or diet and nutrition specialist (dietitian) to adjust your eating plan to your   individual calorie needs. This information is not intended to replace advice given to you by your health care provider. Make sure you discuss any questions you have with your health care provider. Document Revised: 03/14/2017 Document Reviewed: 03/25/2016 Elsevier Patient Education  2020 Elsevier Inc.  

## 2019-11-24 ENCOUNTER — Telehealth: Payer: Self-pay

## 2019-11-24 NOTE — Telephone Encounter (Signed)
Copied from [place] [id number]. Topic: Appointment Scheduling - Scheduling Inquiry for Clinic >> Nov 24, 2019 11:24 AM Mathis Bud wrote: Reason for CRM: Patient woke up this morning with his right eye shut.  Patient states that the eye isn't pink, just swollen.  Patient is requesting a same day appt.  Patient works in [place] and will take him an hour and a half to get to office. Call back [phone]

## 2019-11-25 ENCOUNTER — Ambulatory Visit (INDEPENDENT_AMBULATORY_CARE_PROVIDER_SITE_OTHER): Payer: Self-pay | Admitting: Physician Assistant

## 2019-11-25 ENCOUNTER — Encounter: Payer: Self-pay | Admitting: Physician Assistant

## 2019-11-25 ENCOUNTER — Other Ambulatory Visit: Payer: Self-pay

## 2019-11-25 VITALS — BP 164/102 | HR 64 | Temp 98.6°F | Wt 232.2 lb

## 2019-11-25 DIAGNOSIS — T7840XA Allergy, unspecified, initial encounter: Secondary | ICD-10-CM

## 2019-11-25 MED ORDER — EPINEPHRINE 0.3 MG/0.3ML IJ SOAJ: 0.3000 mg | INTRAMUSCULAR | 1 refills | Status: AC | PRN

## 2019-11-25 MED ORDER — PREDNISONE 10 MG PO TABS: ORAL_TABLET | ORAL | 0 refills | Status: DC

## 2019-11-25 NOTE — Telephone Encounter (Signed)
Called patient and left message to call back to see how he is doing today.

## 2019-11-25 NOTE — Telephone Encounter (Signed)
Patient has appointment today

## 2019-11-25 NOTE — Patient Instructions (Addendum)
6 day steroid taper Claritin 10 mg one daily Pepcid 20 mg twice daily  Benadryl 25 mg four times daily.    Allergies, Adult An allergy is when your body's defense system (immune system) overreacts to an otherwise harmless substance (allergen) that you breathe in or eat or something that touches your skin. When you come into contact with something that you are allergic to, your immune system produces certain proteins (antibodies). These proteins cause cells to release chemicals (histamines) that trigger the symptoms of an allergic reaction. Allergies often affect the nasal passages (allergic rhinitis), eyes (allergic conjunctivitis), skin (atopic dermatitis), and stomach. Allergies can be mild or severe. Allergies cannot spread from person to person (are not contagious). They can develop at any age and may be outgrown. What increases the risk? You may be at greater risk of allergies if other people in your family have allergies. What are the signs or symptoms? Symptoms depend on what type of allergy you have. They may include:  Runny, stuffy nose.  Sneezing.  Itchy mouth, ears, or throat.  Postnasal drip.  Sore throat.  Itchy, red, watery, or puffy eyes.  Skin rash or hives.  Stomach pain.  Vomiting.  Diarrhea.  Bloating.  Wheezing or coughing. People with a severe allergy to food, medicine, or an insect bite may have a life-threatening allergic reaction (anaphylaxis). Symptoms of anaphylaxis include:  Hives.  Itching.  Flushed face.  Swollen lips, tongue, or mouth.  Tight or swollen throat.  Chest pain or tightness in the chest.  Trouble breathing or shortness of breath.  Rapid heartbeat.  Dizziness or fainting.  Vomiting.  Diarrhea.  Pain in the abdomen. How is this diagnosed? This condition is diagnosed based on:  Your symptoms.  Your family and medical history.  A physical exam. You may need to see a health care provider who specializes in  treating allergies (allergist). You may also have tests, including:  Skin tests to see which allergens are causing your symptoms, such as: ? Skin prick test. In this test, your skin is pricked with a tiny needle and exposed to small amounts of possible allergens to see if your skin reacts. ? Intradermal skin test. In this test, a small amount of allergen is injected under your skin to see if your skin reacts. ? Patch test. In this test, a small amount of allergen is placed on your skin and then your skin is covered with a bandage. Your health care provider will check your skin after a couple of days to see if a rash has developed.  Blood tests.  Challenges tests. In this test, you inhale a small amount of allergen by mouth to see if you have an allergic reaction. You may also be asked to:  Keep a food diary. A food diary is a record of all the foods and drinks you have in a day and any symptoms you experience.  Practice an elimination diet. An elimination diet involves eliminating specific foods from your diet and then adding them back in one by one to find out if a certain food causes an allergic reaction. How is this treated? Treatment for allergies depends on your symptoms. Treatment may include:  Cold compresses to soothe itching and swelling.  Eye drops.  Nasal sprays.  Using a saline spray or container (neti pot) to flush out the nose (nasal irrigation). These methods can help clear away mucus and keep the nasal passages moist.  Using a humidifier.  Oral antihistamines or other  medicines to block allergic reaction and inflammation.  Skin creams to treat rashes or itching.  Diet changes to eliminate food allergy triggers.  Repeated exposure to tiny amounts of allergens to build up a tolerance and prevent future allergic reactions (immunotherapy). These include: ? Allergy shots. ? Oral treatment. This involves taking small doses of an allergen under the tongue (sublingual  immunotherapy).  Emergency epinephrine injection (auto-injector) in case of an allergic emergency. This is a self-injectable, pre-measured medicine that must be given within the first few minutes of a serious allergic reaction. Follow these instructions at home:         Avoid known allergens whenever possible.  If you suffer from airborne allergens, wash out your nose daily. You can do this with a saline spray or a neti pot to flush out your nose (nasal irrigation).  Take over-the-counter and prescription medicines only as told by your health care provider.  Keep all follow-up visits as told by your health care provider. This is important.  If you are at risk of a severe allergic reaction (anaphylaxis), keep your auto-injector with you at all times.  If you have ever had anaphylaxis, wear a medical alert bracelet or necklace that states you have a severe allergy. Contact a health care provider if:  Your symptoms do not improve with treatment. Get help right away if:  You have symptoms of anaphylaxis, such as: ? Swollen mouth, tongue, or throat. ? Pain or tightness in your chest. ? Trouble breathing or shortness of breath. ? Dizziness or fainting. ? Severe abdominal pain, vomiting, or diarrhea. This information is not intended to replace advice given to you by your health care provider. Make sure you discuss any questions you have with your health care provider. Document Revised: 06/25/2017 Document Reviewed: 10/18/2015 Elsevier Patient Education  [place].

## 2019-11-25 NOTE — Progress Notes (Signed)
Established patient visit   Patient: Joel Terry   DOB: 08-06-1965   54 y.o. Male  MRN: [id number] Visit Date: 11/25/2019  Today's healthcare provider: Trinna Post, PA-C   Chief Complaint  Patient presents with  . Rash  I,Vernette Moise M Annsley Akkerman,acting as a scribe for [employer], PA-C.,have documented all relevant documentation on the behalf of Trinna Post, PA-C,as directed by  Trinna Post, PA-C while in the presence of Trinna Post, PA-C.  Subjective    Rash This is a new problem. The current episode started in the past 7 days. The problem has been gradually improving since onset. The affected locations include the face, back, left arm and right arm. The rash is characterized by redness and itchiness. He was exposed to nothing. Associated symptoms include eye pain (burning per pt). Pertinent negatives include no congestion, cough, diarrhea, fatigue, fever, shortness of breath or sore throat. Past treatments include cold compress. The treatment provided mild relief.   Patient reports that he also has facial and eye swelling. Patient denies new lotions, creams, medications, foods Denies difficulty breathing. Issues has been fluctuating over 2 days.      Medications: Outpatient Medications Prior to Visit  Medication Sig  . Eszopiclone 3 MG TABS Take 1 tablet (3 mg total) by mouth at bedtime. Take immediately before bedtime  . lisinopril (ZESTRIL) 40 MG tablet Take 1 tablet (40 mg total) by mouth daily.  . methocarbamol (ROBAXIN) 500 MG tablet Take 1 tablet (500 mg total) by mouth every 8 (eight) hours as needed for muscle spasms.  . naproxen (NAPROSYN) 500 MG tablet Take 1 tablet (500 mg total) by mouth 2 (two) times daily with a meal.  . simvastatin (ZOCOR) 20 MG tablet TAKE 1 TABLET AT BEDTIME (SCHEDULE OFFICE VISIT BEFORE ANY FUTURE REFILLS)  . SUMAtriptan (IMITREX) 100 MG tablet Take 1 tablet (100 mg total) by mouth every 2 (two) hours as needed for migraine.  May repeat in 2 hours if headache persists or recurs.  . ALPRAZolam (XANAX) 0.25 MG tablet TAKE 1 TABLET THREE TIMES A DAY AS NEEDED FOR ANXIETY (Patient not taking: Reported on 08/12/2019)  . fluticasone furoate-vilanterol (BREO ELLIPTA) 100-25 MCG/INH AEPB Inhale 1 puff into the lungs daily. (Patient not taking: Reported on 08/12/2019)   No facility-administered medications prior to visit.    Review of Systems  Constitutional: Negative.  Negative for fatigue and fever.  HENT: Negative for congestion and sore throat.   Eyes: Positive for pain (burning per pt).  Respiratory: Negative for cough and shortness of breath.   Gastrointestinal: Negative for diarrhea.  Skin: Positive for rash.  Allergic/Immunologic: Negative.        Objective    BP (!) 164/102 (BP Location: Right Arm, Patient Position: Sitting, Cuff Size: Normal)   Pulse 64   Temp 98.6 F (37 C) (Oral)   Wt 232 lb 3.2 oz (105.3 kg)   SpO2 96%   BMI 35.31 kg/m     Physical Exam Constitutional:      Appearance: Normal appearance. He is obese.  HENT:     Mouth/Throat:     Mouth: Mucous membranes are moist.     Pharynx: Oropharynx is clear.  Pulmonary:     Effort: Pulmonary effort is normal. No respiratory distress.  Skin:    General: Skin is warm and dry.  Neurological:     General: No focal deficit present.     Mental Status: He is alert  and oriented to person, place, and time.  Psychiatric:        Mood and Affect: Mood normal.        Behavior: Behavior normal.     Media Information   Document Information  Photos  Face  11/25/2019 13:34  Attached To:  Office Visit on 11/25/19 with Trinna Post, PA-C  Source Information  Paulene Floor  [place]  Left shoulder   11/25/2019 13:34  Attached To:  Office Visit on 11/25/19 with Trinna Post, PA-C  Source Information  Paulene Floor  [place]  Right shoulder   11/25/2019 13:34  Attached To:  Office Visit on 11/25/19 with Trinna Post, PA-C  Source Information  Trinna Post, PA-C  [facility]     No results found for any visits on 11/25/19.  Assessment & Plan    1. Allergic reaction, initial encounter  Benadryl 25 mg Q6H. Pepcid 20 mg BID. And daily claritin to treat for allergic reaction.   - predniSONE (DELTASONE) 10 MG tablet; Take 6 pills on day 1, 5 pills on day 2 and so on until complete.  Dispense: 21 tablet; Refill: 0 - EPINEPHrine 0.3 mg/0.3 mL IJ SOAJ injection; Inject 0.3 mLs (0.3 mg total) into the muscle as needed for anaphylaxis.  Dispense: 1 each; Refill: 1   Return if symptoms worsen or fail to improve.      ITrinna Post, PA-C, have reviewed all documentation for this visit. The documentation on 12/01/19 for the exam, diagnosis, procedures, and orders are all accurate and complete.  The entirety of the information documented in the History of Present Illness, Review of Systems and Physical Exam were personally obtained by me. Portions of this information were initially documented by [facility] and reviewed by me for thoroughness and accuracy.     Paulene Floor  [facility] [phone] (phone) [phone] (fax)  [place]

## 2020-02-16 NOTE — Progress Notes (Signed)
Established patient visit   Patient: Joel Terry   DOB: 1965-11-08   54 y.o. Male  MRN: [id number] Visit Date: 02/18/2020  Today's healthcare provider: Mar Daring, PA-C   Chief Complaint  Patient presents with  . Leg Pain   Subjective    Leg Pain  The incident occurred more than 1 week ago ( A month and half ago he has been having this pain but now is worsening that he is starting to lose stability). There was no injury mechanism. The pain is present in the right leg. The quality of the pain is described as aching, shooting and stabbing. Pain scale: when it's bad is a 6-7/10. The pain is moderate. The pain has been worsening (whe he stand or walking is really sharp pain and radiates down his leg. If he is sitting is numb) since onset. Associated symptoms include an inability to bear weight ("when is hurting"), numbness and tingling. He reports no foreign bodies present. The symptoms are aggravated by weight bearing and movement. He has tried ice, heat, non-weight bearing, NSAIDs and acetaminophen (Aleve, Naproxen) for the symptoms. The treatment provided no relief.     Patient Active Problem List   Diagnosis Date Noted  . Recurrent major depressive disorder, in partial remission ([place]) 08/12/2019  . Asthma 06/10/2018  . Incomplete emptying of bladder 12/01/2015  . 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  . H/O renal calculi 04/26/2015  . Benign prostatic hyperplasia with urinary obstruction 04/26/2015  . Migraines 01/13/2015  . Ulcerative colitis ([place]) 01/13/2015  . 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 Allergic rhinitis 12/16/2013  . Benign essential HTN 12/16/2013  . HLD (hyperlipidemia) 12/16/2013   Past Medical History:  Diagnosis Date  . Asthma   . Chronic kidney disease   . Headache   . History of kidney stones   . Hypertension   . Nephrolithiasis   . Obesity   . Ulcerative colitis ([place])        Medications: Outpatient Medications  Prior to Visit  Medication Sig  . EPINEPHrine 0.3 mg/0.3 mL IJ SOAJ injection Inject 0.3 mLs (0.3 mg total) into the muscle as needed for anaphylaxis.  . Eszopiclone 3 MG TABS Take 1 tablet (3 mg total) by mouth at bedtime. Take immediately before bedtime  . fluticasone furoate-vilanterol (BREO ELLIPTA) 100-25 MCG/INH AEPB Inhale 1 puff into the lungs daily.  Marland Kitchen lisinopril (ZESTRIL) 40 MG tablet Take 1 tablet (40 mg total) by mouth daily.  . naproxen (NAPROSYN) 500 MG tablet Take 1 tablet (500 mg total) by mouth 2 (two) times daily with a meal.  . simvastatin (ZOCOR) 20 MG tablet TAKE 1 TABLET AT BEDTIME (SCHEDULE OFFICE VISIT BEFORE ANY FUTURE REFILLS)  . SUMAtriptan (IMITREX) 100 MG tablet Take 1 tablet (100 mg total) by mouth every 2 (two) hours as needed for migraine. May repeat in 2 hours if headache persists or recurs.  . ALPRAZolam (XANAX) 0.25 MG tablet TAKE 1 TABLET THREE TIMES A DAY AS NEEDED FOR ANXIETY (Patient not taking: Reported on 08/12/2019)  . methocarbamol (ROBAXIN) 500 MG tablet Take 1 tablet (500 mg total) by mouth every 8 (eight) hours as needed for muscle spasms. (Patient not taking: Reported on 02/18/2020)  . predniSONE (DELTASONE) 10 MG tablet Take 6 pills on day 1, 5 pills on day 2 and so on until complete.   No facility-administered medications prior to visit.    Review of Systems  Constitutional: Negative.   Respiratory:  Negative.   Cardiovascular: Negative.   Musculoskeletal: Positive for arthralgias and myalgias.  Neurological: Positive for tingling, weakness and numbness.    Last CBC Lab Results  Component Value Date   WBC 6.7 08/09/2019   HGB 16.7 08/09/2019   HCT 47.9 08/09/2019   MCV 90.2 08/09/2019   MCH 31.5 08/09/2019   RDW 13.3 08/09/2019   PLT 388 40/98/1191   Last metabolic panel Lab Results  Component Value Date   GLUCOSE 109 (H) 08/09/2019   NA 138 08/09/2019   K 3.9 08/09/2019   CL 107 08/09/2019   CO2 24 08/09/2019   BUN 16  08/09/2019   CREATININE 1.08 08/09/2019   GFRNONAA >60 08/09/2019   GFRAA >60 08/09/2019   CALCIUM 9.1 08/09/2019   PROT 6.9 08/12/2019   ALBUMIN 4.4 08/12/2019   LABGLOB 2.6 07/14/2015   AGRATIO 1.5 07/14/2015   BILITOT 0.4 08/12/2019   ALKPHOS 74 08/12/2019   AST 60 (H) 08/12/2019   ALT 85 (H) 08/12/2019   ANIONGAP 7 08/09/2019      Objective    BP (!) 153/100 (BP Location: Left Arm, Patient Position: Sitting, Cuff Size: Large)   Pulse 69   Temp 98.2 F (36.8 C) (Oral)   Resp 16   Wt 240 lb 12.8 oz (109.2 kg)   BMI 36.61 kg/m  BP Readings from Last 3 Encounters:  02/18/20 (!) 153/100  11/25/19 (!) 164/102  10/11/19 138/70   Wt Readings from Last 3 Encounters:  02/18/20 240 lb 12.8 oz (109.2 kg)  11/25/19 232 lb 3.2 oz (105.3 kg)  09/09/19 238 lb (108 kg)      Physical Exam Vitals reviewed.  Constitutional:      General: He is not in acute distress.    Appearance: Normal appearance. He is well-developed. He is obese. He is not ill-appearing.  HENT:     Head: Normocephalic and atraumatic.  Eyes:     Conjunctiva/sclera: Conjunctivae normal.  Pulmonary:     Effort: Pulmonary effort is normal. No respiratory distress.  Musculoskeletal:     Cervical back: Normal range of motion and neck supple.  Neurological:     Mental Status: He is alert.  Psychiatric:        Mood and Affect: Mood normal.        Behavior: Behavior normal.        Thought Content: Thought content normal.        Judgment: Judgment normal.      No results found for any visits on 02/18/20.  Assessment & Plan     1. Pinched nerve Symptoms consistent with pinched nerve, radicular symptoms. Does have history of back injury in his 84s that required ESI and therapy. Will get imaging as below to r/o bony abnormality. Suspect more disc herniation. Gabapentin for nerve pain. Referral to ortho spine placed. Call if worsening before seen.  - DG Lumbar Spine Complete; Future - gabapentin (NEURONTIN)  100 MG capsule; Start with 1 tab (153m) PO q hs x 1 week, then increase to BID x 1 week, then TID  Dispense: 90 capsule; Refill: 0 - Ambulatory referral to Orthopedic Surgery  2. Lumbar back pain with radiculopathy affecting right lower extremity See above medical treatment plan. - DG Lumbar Spine Complete; Future - gabapentin (NEURONTIN) 100 MG capsule; Start with 1 tab (1036m PO q hs x 1 week, then increase to BID x 1 week, then TID  Dispense: 90 capsule; Refill: 0 - Ambulatory referral to Orthopedic Surgery  3. Flu vaccine need Flu vaccine given today without complication. Patient sat upright for 15 minutes to check for adverse reaction before being released. - Flu Vaccine QUAD 36+ mos IM   No follow-ups on file.      Reynolds Bowl, PA-C, have reviewed all documentation for this visit. The documentation on 02/18/20 for the exam, diagnosis, procedures, and orders are all accurate and complete.   Rubye Beach  [facility] [phone] (phone) [phone] (fax)  [place]

## 2020-02-18 ENCOUNTER — Other Ambulatory Visit: Payer: Self-pay

## 2020-02-18 ENCOUNTER — Encounter: Payer: Self-pay | Admitting: Physician Assistant

## 2020-02-18 ENCOUNTER — Ambulatory Visit
Admission: RE | Admit: 2020-02-18 | Discharge: 2020-02-18 | Disposition: A | Discharge status: Home / Self Care | Payer: BC Managed Care – PPO | Attending: Physician Assistant | Admitting: Physician Assistant

## 2020-02-18 ENCOUNTER — Ambulatory Visit
Admission: RE | Admit: 2020-02-18 | Discharge: 2020-02-18 | Disposition: A | Discharge status: Home / Self Care | Payer: BC Managed Care – PPO | Source: Ambulatory Visit | Attending: Physician Assistant | Admitting: Physician Assistant

## 2020-02-18 ENCOUNTER — Ambulatory Visit (INDEPENDENT_AMBULATORY_CARE_PROVIDER_SITE_OTHER): Payer: BC Managed Care – PPO | Admitting: Physician Assistant

## 2020-02-18 VITALS — BP 153/100 | HR 69 | Temp 98.2°F | Resp 16 | Wt 240.8 lb

## 2020-02-18 DIAGNOSIS — M5416 Radiculopathy, lumbar region: Secondary | ICD-10-CM

## 2020-02-18 DIAGNOSIS — G589 Mononeuropathy, unspecified: Secondary | ICD-10-CM | POA: Diagnosis not present

## 2020-02-18 DIAGNOSIS — Z23 Encounter for immunization: Secondary | ICD-10-CM | POA: Diagnosis not present

## 2020-02-18 DIAGNOSIS — M47817 Spondylosis without myelopathy or radiculopathy, lumbosacral region: Secondary | ICD-10-CM | POA: Diagnosis not present

## 2020-02-18 DIAGNOSIS — N200 Calculus of kidney: Secondary | ICD-10-CM | POA: Diagnosis not present

## 2020-02-18 DIAGNOSIS — R200 Anesthesia of skin: Secondary | ICD-10-CM | POA: Diagnosis not present

## 2020-02-18 MED ORDER — GABAPENTIN 100 MG PO CAPS: ORAL_CAPSULE | ORAL | 0 refills | Status: DC

## 2020-02-18 NOTE — Patient Instructions (Signed)
Pinched Nerve A pinched nerve is an injury that occurs when too much pressure is placed on a nerve. This pressure can cause pain, burning, and muscle weakness in places that the nerve supplies feeling to, such as an arm, hand, or leg, or the back or neck. If a nerve is severely pinched or has been pinched for a long time, permanent nerve damage can occur. What are the causes? This condition may be caused by:  A nerve passing through a narrow area between bones or other body structures.  Arthritis that causes bones to press on a nerve.  Loss of blood supply to a nerve.  A nerve being stretched from an injury.  A sudden injury with swelling.  Long-term wear on the nerve.  Age-related changes in the spine. What are the signs or symptoms? The most common symptoms of a pinched nerve are feeling a tingling sensation and numbness. Other symptoms include:  Pain that spreads from one area of the body part to another.  A burning feeling.  Muscle weakness. How is this diagnosed? This condition may be diagnosed based on:  A physical exam. During the exam, your health care provider will: ? Check for numbness and muscle weakness. ? Move affected body parts to test for pain.  X-rays to check for bone damage.  A MRI or CT scan to check for conditions that may be causing nerve damage.  A muscle test (electromyogram, EMG) to evaluate how your muscles and nerves communicate. How is this treated?     A pinched nerve is usually treated first by:  Resting the affected body area.  Using devices to help you move without pain (supportive or protective devices), such as a splint, brace, or neck collar. Other treatments depend on your symptoms and the amount of nerve damage you have. Other treatments may include:  Medicines, such as: ? Injections of numbing medicine. ? NSAIDs. ? Pain medicines. ? Steroid medicines. These may be given as a pill or as an injection.  Physical therapy to  relieve pain, maintain movement, and improve muscle strength.  Surgery. This may be done if other treatments do not work. Follow these instructions at home:      Take over-the-counter and prescription medicines only as told by your health care provider.  Wear supportive or protective devices as told by your health care provider.  Do physical therapy exercises as directed.  Ask your health care provider what activities are safe for you.  Rest as needed.  If directed, put ice on the affected area: ? Put ice in a plastic bag. ? Place a towel between your skin and the bag. ? Leave the ice on for 20 minutes, 2-3 times a day.  If directed, apply heat to the affected area. Use the heat source that your health care provider recommends, such as a moist heat pack or a heating pad. ? Place a towel between your skin and the heat source. ? Leave the heat on for 20-30 minutes. ? Remove the heat if your skin turns bright red. This is especially important if you are unable to feel pain, heat, or cold. You may have a greater risk of getting burned.  Do not drive or use heavy machinery while taking prescription pain medicine.  Keep all follow-up visits as told by your health care provider. This is important. Contact a health care provider if:  Your condition does not improve with treatment.  Your pain, numbness, or weakness suddenly gets worse. Get help  right away if you:  Have loss of bladder control (urinary incontinence) or you cannot urinate.  Cannot control bowel movements (fecal incontinence).  Have new weakness in your arms or legs. Summary  A pinched nerve is an injury that occurs when too much pressure is placed on a nerve.  This pressure can cause pain, burning, and muscle weakness in places that the nerve supplies feeling to, such as an arm, hand, or leg, or the back or neck.  Take over-the-counter and prescription medicines only as told by your health care provider.  Ask  your health care provider what activities are safe for you while you are having symptoms. This information is not intended to replace advice given to you by your health care provider. Make sure you discuss any questions you have with your health care provider. Document Revised: 04/18/2017 Document Reviewed: 04/15/2017 Elsevier Patient Education  2020 [place].   Back Exercises The following exercises strengthen the muscles that help to support the trunk and back. They also help to keep the lower back flexible. Doing these exercises can help to prevent back pain or lessen existing pain.  If you have back pain or discomfort, try doing these exercises 2-3 times each day or as told by your health care provider.  As your pain improves, do them once each day, but increase the number of times that you repeat the steps for each exercise (do more repetitions).  To prevent the recurrence of back pain, continue to do these exercises once each day or as told by your health care provider. Do exercises exactly as told by your health care provider and adjust them as directed. It is normal to feel mild stretching, pulling, tightness, or discomfort as you do these exercises, but you should stop right away if you feel sudden pain or your pain gets worse. Exercises Single knee to chest Repeat these steps 3-5 times for each leg: 1. Lie on your back on a firm bed or the floor with your legs extended. 2. Bring one knee to your chest. Your other leg should stay extended and in contact with the floor. 3. Hold your knee in place by grabbing your knee or thigh with both hands and hold. 4. Pull on your knee until you feel a gentle stretch in your lower back or buttocks. 5. Hold the stretch for 10-30 seconds. 6. Slowly release and straighten your leg. Pelvic tilt Repeat these steps 5-10 times: 1. Lie on your back on a firm bed or the floor with your legs extended. 2. Bend your knees so they are pointing toward  the ceiling and your feet are flat on the floor. 3. Tighten your lower abdominal muscles to press your lower back against the floor. This motion will tilt your pelvis so your tailbone points up toward the ceiling instead of pointing to your feet or the floor. 4. With gentle tension and even breathing, hold this position for 5-10 seconds. Cat-cow Repeat these steps until your lower back becomes more flexible: 1. Get into a hands-and-knees position on a firm surface. Keep your hands under your shoulders, and keep your knees under your hips. You may place padding under your knees for comfort. 2. Let your head hang down toward your chest. Contract your abdominal muscles and point your tailbone toward the floor so your lower back becomes rounded like the back of a cat. 3. Hold this position for 5 seconds. 4. Slowly lift your head, let your abdominal muscles relax and point  your tailbone up toward the ceiling so your back forms a sagging arch like the back of a cow. 5. Hold this position for 5 seconds.  Press-ups Repeat these steps 5-10 times: 1. Lie on your abdomen (face-down) on the floor. 2. Place your palms near your head, about shoulder-width apart. 3. Keeping your back as relaxed as possible and keeping your hips on the floor, slowly straighten your arms to raise the top half of your body and lift your shoulders. Do not use your back muscles to raise your upper torso. You may adjust the placement of your hands to make yourself more comfortable. 4. Hold this position for 5 seconds while you keep your back relaxed. 5. Slowly return to lying flat on the floor.  Bridges Repeat these steps 10 times: 1. Lie on your back on a firm surface. 2. Bend your knees so they are pointing toward the ceiling and your feet are flat on the floor. Your arms should be flat at your sides, next to your body. 3. Tighten your buttocks muscles and lift your buttocks off the floor until your waist is at almost the same  height as your knees. You should feel the muscles working in your buttocks and the back of your thighs. If you do not feel these muscles, slide your feet 1-2 inches farther away from your buttocks. 4. Hold this position for 3-5 seconds. 5. Slowly lower your hips to the starting position, and allow your buttocks muscles to relax completely. If this exercise is too easy, try doing it with your arms crossed over your chest. Abdominal crunches Repeat these steps 5-10 times: 1. Lie on your back on a firm bed or the floor with your legs extended. 2. Bend your knees so they are pointing toward the ceiling and your feet are flat on the floor. 3. Cross your arms over your chest. 4. Tip your chin slightly toward your chest without bending your neck. 5. Tighten your abdominal muscles and slowly raise your trunk (torso) high enough to lift your shoulder blades a tiny bit off the floor. Avoid raising your torso higher than that because it can put too much stress on your low back and does not help to strengthen your abdominal muscles. 6. Slowly return to your starting position. Back lifts Repeat these steps 5-10 times: 1. Lie on your abdomen (face-down) with your arms at your sides, and rest your forehead on the floor. 2. Tighten the muscles in your legs and your buttocks. 3. Slowly lift your chest off the floor while you keep your hips pressed to the floor. Keep the back of your head in line with the curve in your back. Your eyes should be looking at the floor. 4. Hold this position for 3-5 seconds. 5. Slowly return to your starting position. Contact a health care provider if:  Your back pain or discomfort gets much worse when you do an exercise.  Your worsening back pain or discomfort does not lessen within 2 hours after you exercise. If you have any of these problems, stop doing these exercises right away. Do not do them again unless your health care provider says that you can. Get help right away  if:  You develop sudden, severe back pain. If this happens, stop doing the exercises right away. Do not do them again unless your health care provider says that you can. This information is not intended to replace advice given to you by your health care provider. Make sure you discuss  any questions you have with your health care provider. Document Revised: 08/06/2018 Document Reviewed: 01/01/2018 Elsevier Patient Education  [place].

## 2020-02-21 ENCOUNTER — Encounter: Payer: Self-pay | Admitting: Physician Assistant

## 2020-02-21 ENCOUNTER — Telehealth: Payer: Self-pay

## 2020-02-21 DIAGNOSIS — G589 Mononeuropathy, unspecified: Secondary | ICD-10-CM

## 2020-02-21 DIAGNOSIS — M5416 Radiculopathy, lumbar region: Secondary | ICD-10-CM

## 2020-02-21 NOTE — Telephone Encounter (Signed)
Pt advised.... He would like to see an orthopedic in the [place] area if possible.  (not [place] or [place])  Thanks,   -Mickel Baas

## 2020-02-21 NOTE — Telephone Encounter (Signed)
Referral changed

## 2020-02-21 NOTE — Telephone Encounter (Signed)
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   Of note a stone was noted in the left lower kidney. Has not come out yet, so should not be causing issue at this time

## 2020-02-24 MED ORDER — PREDNISONE 10 MG PO TABS
ORAL_TABLET | ORAL | 0 refills | Status: DC
Start: 2020-02-24 — End: 2020-02-24

## 2020-02-24 MED ORDER — PREDNISONE 10 MG PO TABS
ORAL_TABLET | ORAL | 0 refills | Status: DC
Start: 2020-02-24 — End: 2020-04-03

## 2020-02-24 NOTE — Addendum Note (Signed)
Addended by: Mar Daring on: 02/24/2020 08:25 AM   Modules accepted: Orders

## 2020-03-15 ENCOUNTER — Encounter: Payer: Self-pay | Admitting: Physician Assistant

## 2020-04-03 ENCOUNTER — Encounter: Payer: Self-pay | Admitting: Adult Health

## 2020-04-03 ENCOUNTER — Telehealth (INDEPENDENT_AMBULATORY_CARE_PROVIDER_SITE_OTHER): Payer: BC Managed Care – PPO | Admitting: Adult Health

## 2020-04-03 ENCOUNTER — Other Ambulatory Visit: Payer: Self-pay

## 2020-04-03 VITALS — Temp 97.7°F | Ht 68.0 in | Wt 235.0 lb

## 2020-04-03 DIAGNOSIS — R062 Wheezing: Secondary | ICD-10-CM | POA: Insufficient documentation

## 2020-04-03 DIAGNOSIS — U071 COVID-19: Secondary | ICD-10-CM | POA: Diagnosis not present

## 2020-04-03 DIAGNOSIS — R059 Cough, unspecified: Secondary | ICD-10-CM | POA: Insufficient documentation

## 2020-04-03 DIAGNOSIS — J9801 Acute bronchospasm: Secondary | ICD-10-CM | POA: Insufficient documentation

## 2020-04-03 DIAGNOSIS — H9201 Otalgia, right ear: Secondary | ICD-10-CM | POA: Diagnosis not present

## 2020-04-03 MED ORDER — ALBUTEROL SULFATE HFA 108 (90 BASE) MCG/ACT IN AERS
2.0000 | INHALATION_SPRAY | Freq: Four times a day (QID) | RESPIRATORY_TRACT | 0 refills | Status: DC | PRN
Start: 2020-04-03 — End: 2021-02-13

## 2020-04-03 MED ORDER — HYDROCOD POLST-CPM POLST ER 10-8 MG/5ML PO SUER: 5.0000 mL | Freq: Two times a day (BID) | ORAL | 0 refills | Status: DC | PRN

## 2020-04-03 MED ORDER — PREDNISONE 10 MG (21) PO TBPK: ORAL_TABLET | ORAL | 0 refills | Status: DC

## 2020-04-03 MED ORDER — AMOXICILLIN-POT CLAVULANATE 875-125 MG PO TABS: 1.0000 | ORAL_TABLET | Freq: Two times a day (BID) | ORAL | 0 refills | Status: DC

## 2020-04-03 NOTE — Progress Notes (Signed)
MyChart Video Visit    Virtual Visit via Video Note   This visit type was conducted due to national recommendations for restrictions regarding the COVID-19 Pandemic (e.g. social distancing) in an effort to limit this patient's exposure and mitigate transmission in our community. This patient is at least at moderate risk for complications without adequate follow up. This format is felt to be most appropriate for this patient at this time. Physical exam was limited by quality of the video and audio technology used for the visit.  Parties involved in visit as below:    Patient location: at home  Provider location: Provider: Provider's office at  [facility], [place].     I discussed the limitations of evaluation and management by telemedicine and the availability of in person appointments. The patient expressed understanding and agreed to proceed.  Patient: Joel Terry   DOB: [birth year]   54 y.o. Male  MRN: [id number] Visit Date: 04/03/2020  Today's healthcare provider: Marcille Buffy, FNP   Chief Complaint  Patient presents with  . Cough   Subjective    Cough This is a new problem. The current episode started in the past 7 days. The problem has been unchanged. The problem occurs constantly. The cough is non-productive. Associated symptoms include ear pain, rhinorrhea, a sore throat, shortness of breath and wheezing. He has tried steroid inhaler for the symptoms. The treatment provided no relief. His past medical history is significant for asthma.    He had a positive covid test  2 days ago. Body aches improving. He has stuffy head. His cough is irritating him at night. Onset one week ago.  He has a dry cough. Ear pain right eat denies any sraina  Patient  denies any fever, body aches have improved, chills, rash, chest pain, shortness of breath, nausea, vomiting, or diarrhea.  Denies dizziness, lightheadedness, pre syncopal or syncopal episodes.   Denies any chest tightness.  Denies any fever reducers routinely.   Does  Not check oxygen satutioran.  He has been vaccinated for Covid last Pz was May 2021. Has had two doses.      Medications: Outpatient Medications Prior to Visit  Medication Sig  . EPINEPHrine 0.3 mg/0.3 mL IJ SOAJ injection Inject 0.3 mLs (0.3 mg total) into the muscle as needed for anaphylaxis.  . Eszopiclone 3 MG TABS Take 1 tablet (3 mg total) by mouth at bedtime. Take immediately before bedtime  . fluticasone furoate-vilanterol (BREO ELLIPTA) 100-25 MCG/INH AEPB Inhale 1 puff into the lungs daily.  Marland Kitchen gabapentin (NEURONTIN) 100 MG capsule Start with 1 tab (128m) PO q hs x 1 week, then increase to BID x 1 week, then TID  . lisinopril (ZESTRIL) 40 MG tablet Take 1 tablet (40 mg total) by mouth daily.  . methocarbamol (ROBAXIN) 500 MG tablet Take 1 tablet (500 mg total) by mouth every 8 (eight) hours as needed for muscle spasms. (Patient not taking: Reported on 02/18/2020)  . naproxen (NAPROSYN) 500 MG tablet Take 1 tablet (500 mg total) by mouth 2 (two) times daily with a meal.  . simvastatin (ZOCOR) 20 MG tablet TAKE 1 TABLET AT BEDTIME (SCHEDULE OFFICE VISIT BEFORE ANY FUTURE REFILLS)  . SUMAtriptan (IMITREX) 100 MG tablet Take 1 tablet (100 mg total) by mouth every 2 (two) hours as needed for migraine. May repeat in 2 hours if headache persists or recurs.  . [DISCONTINUED] predniSONE (DELTASONE) 10 MG tablet Take 6 tablets PO on day 1 and  day 2, take 5 tablets PO on day 3 and day 4, take 4 tablets PO on day 5 and day 6, take 3 tablets PO on day 7 and day 8, take 2 tablets PO on day 9 and day 10, take one tablet PO on day 11 and day 12.   No facility-administered medications prior to visit.    Review of Systems  HENT: Positive for congestion, ear pain, rhinorrhea and sore throat.   Respiratory: Positive for cough, shortness of breath and wheezing.        Objective    Temp 97.7 F (36.5 C) (Oral)   Ht 5'  8" (1.727 m)   Wt 235 lb (106.6 kg)   BMI 35.73 kg/m  BP Readings from Last 3 Encounters:  02/18/20 (!) 153/100  11/25/19 (!) 164/102  10/11/19 138/70   Wt Readings from Last 3 Encounters:  04/03/20 235 lb (106.6 kg)  02/18/20 240 lb 12.8 oz (109.2 kg)  11/25/19 232 lb 3.2 oz (105.3 kg)      Physical Exam     Patient is alert and oriented and responsive to questions Engages in conversation with provider. Speaks in full sentences without any pauses without any shortness of breath or distress.     Assessment & Plan    COVID  Right ear pain  Wheezing  Cough  Bronchospasm   Meds ordered this encounter  Medications  . predniSONE (STERAPRED UNI-PAK 21 TAB) 10 MG (21) TBPK tablet    Sig: PO: Take 6 tablets on day 1:Take 5 tablets day 2:Take 4 tablets day 3: Take 3 tablets day 4:Take 2 tablets day five: 5 Take 1 tablet day 6    Dispense:  21 tablet    Refill:  0  . albuterol (VENTOLIN HFA) 108 (90 Base) MCG/ACT inhaler    Sig: Inhale 2 puffs into the lungs every 6 (six) hours as needed for wheezing or shortness of breath.    Dispense:  8 g    Refill:  0  . chlorpheniramine-HYDROcodone (TUSSIONEX PENNKINETIC ER) 10-8 MG/5ML SUER    Sig: Take 5 mLs by mouth every 12 (twelve) hours as needed for cough (will causre drowsiness.).    Dispense:  115 mL    Refill:  0  . amoxicillin-clavulanate (AUGMENTIN) 875-125 MG tablet    Sig: Take 1 tablet by mouth 2 (two) times daily.    Dispense:  20 tablet    Refill:  0     Red Flags discussed. The patient was given clear instructions to go to ER or return to medical center if any red flags develop, symptoms do not improve, worsen or new problems develop. They verbalized understanding.  I discussed the limitations of evaluation and management by telemedicine and the availability of in person appointments. The patient expressed understanding and agreed to proceed.  Return in about 3 years (around 04/04/2023), or if symptoms worsen  or fail to improve, for at any time for any worsening symptoms, Call 911 for emergencies.     I discussed the assessment and treatment plan with the patient. The patient was provided an opportunity to ask questions and all were answered. The patient agreed with the plan and demonstrated an understanding of the instructions.   The patient was advised to call back or seek an in-person evaluation if the symptoms worsen or if the condition fails to improve as anticipated.  I provided 30 minutes of non-face-to-face time during this encounter. The entirety of the information documented in the  History of Present Illness, Review of Systems and Physical Exam were personally obtained by me. Portions of this information were initially documented by the CMA and reviewed by me for thoroughness and accuracy.     Marcille Buffy, [place] [phone] (phone) [phone] (fax)  [place]

## 2020-04-09 ENCOUNTER — Encounter: Payer: Self-pay | Admitting: Adult Health

## 2020-04-09 NOTE — Patient Instructions (Addendum)
Otitis Media, Adult  Otitis media means that the middle ear is red and swollen (inflamed) and full of fluid. The condition usually goes away on its own. Follow these instructions at home:  Take over-the-counter and prescription medicines only as told by your doctor.  If you were prescribed an antibiotic medicine, take it as told by your doctor. Do not stop taking the antibiotic even if you start to feel better.  Keep all follow-up visits as told by your doctor. This is important. Contact a doctor if:  You have bleeding from your nose.  There is a lump on your neck.  You are not getting better in 5 days.  You feel worse instead of better. Get help right away if:  You have pain that is not helped with medicine.  You have swelling, redness, or pain around your ear.  You get a stiff neck.  You cannot move part of your face (paralyzed).  You notice that the bone behind your ear hurts when you touch it.  You get a very bad headache. Summary  Otitis media means that the middle ear is red, swollen, and full of fluid.  This condition usually goes away on its own. In some cases, treatment may be needed.  If you were prescribed an antibiotic medicine, take it as told by your doctor. This information is not intended to replace advice given to you by your health care provider. Make sure you discuss any questions you have with your health care provider. Document Revised: 03/14/2017 Document Reviewed: 04/22/2016 Elsevier Patient Education  2020 [place]. Upper Respiratory Infection, Adult An upper respiratory infection (URI) affects the nose, throat, and upper air passages. URIs are caused by germs (viruses). The most common type of URI is often called "the common cold." Medicines cannot cure URIs, but you can do things at home to relieve your symptoms. URIs usually get better within 7-10 days. Follow these instructions at home: Activity  Rest as needed.  If you have a fever,  stay home from work or school until your fever is gone, or until your doctor says you may return to work or school. ? You should stay home until you cannot spread the infection anymore (you are not contagious). ? Your doctor may have you wear a face mask so you have less risk of spreading the infection. Relieving symptoms  Gargle with a salt-water mixture 3-4 times a day or as needed. To make a salt-water mixture, completely dissolve -1 tsp of salt in 1 cup of warm water.  Use a cool-mist humidifier to add moisture to the air. This can help you breathe more easily. Eating and drinking   Drink enough fluid to keep your pee (urine) pale yellow.  Eat soups and other clear broths. General instructions   Take over-the-counter and prescription medicines only as told by your doctor. These include cold medicines, fever reducers, and cough suppressants.  Do not use any products that contain nicotine or tobacco. These include cigarettes and e-cigarettes. If you need help quitting, ask your doctor.  Avoid being where people are smoking (avoid secondhand smoke).  Make sure you get regular shots and get the flu shot every year.  Keep all follow-up visits as told by your doctor. This is important. How to avoid spreading infection to others   Wash your hands often with soap and water. If you do not have soap and water, use hand sanitizer.  Avoid touching your mouth, face, eyes, or nose.  Cough or  sneeze into a tissue or your sleeve or elbow. Do not cough or sneeze into your hand or into the air. Contact a doctor if:  You are getting worse, not better.  You have any of these: ? A fever. ? Chills. ? Brown or red mucus in your nose. ? Yellow or brown fluid (discharge)coming from your nose. ? Pain in your face, especially when you bend forward. ? Swollen neck glands. ? Pain with swallowing. ? White areas in the back of your throat. Get help right away if:  You have shortness of breath  that gets worse.  You have very bad or constant: ? Headache. ? Ear pain. ? Pain in your forehead, behind your eyes, and over your cheekbones (sinus pain). ? Chest pain.  You have long-lasting (chronic) lung disease along with any of these: ? Wheezing. ? Long-lasting cough. ? Coughing up blood. ? A change in your usual mucus.  You have a stiff neck.  You have changes in your: ? Vision. ? Hearing. ? Thinking. ? Mood. Summary  An upper respiratory infection (URI) is caused by a germ called a virus. The most common type of URI is often called "the common cold."  URIs usually get better within 7-10 days.  Take over-the-counter and prescription medicines only as told by your doctor. This information is not intended to replace advice given to you by your health care provider. Make sure you discuss any questions you have with your health care provider. Document Revised: 04/09/2018 Document Reviewed: 11/22/2016 Elsevier Patient Education  [place].

## 2020-04-13 DIAGNOSIS — Z20822 Contact with and (suspected) exposure to covid-19: Secondary | ICD-10-CM | POA: Diagnosis not present

## 2020-04-19 ENCOUNTER — Ambulatory Visit: Payer: Self-pay

## 2020-04-19 NOTE — Telephone Encounter (Signed)
Pt. Diagnosed with COVID 19 03/29/20. Has a lingering dry cough and shortness of breath. Had a negative test 04/15/20. Virtual visit made for tomorrow.  Reason for Disposition . [1] Continuous (nonstop) coughing interferes with work or school AND [2] no improvement using cough treatment per protocol  Answer Assessment - Initial Assessment Questions 1. ONSET: "When did the cough begin?"      COVID 19 03/29/20 2. SEVERITY: "How bad is the cough today?"      Moderate 3. SPUTUM: "Describe the color of your sputum" (none, dry cough; clear, white, yellow, green)     None 4. HEMOPTYSIS: "Are you coughing up any blood?" If so ask: "How much?" (flecks, streaks, tablespoons, etc.)     No 5. DIFFICULTY BREATHING: "Are you having difficulty breathing?" If Yes, ask: "How bad is it?" (e.g., mild, moderate, severe)    - MILD: No SOB at rest, mild SOB with walking, speaks normally in sentences, can lay down, no retractions, pulse < 100.    - MODERATE: SOB at rest, SOB with minimal exertion and prefers to sit, cannot lie down flat, speaks in phrases, mild retractions, audible wheezing, pulse 100-120.    - SEVERE: Very SOB at rest, speaks in single words, struggling to breathe, sitting hunched forward, retractions, pulse > 120      Mild 6. FEVER: "Do you have a fever?" If Yes, ask: "What is your temperature, how was it measured, and when did it start?"     No 7. CARDIAC HISTORY: "Do you have any history of heart disease?" (e.g., heart attack, congestive heart failure)      No 8. LUNG HISTORY: "Do you have any history of lung disease?"  (e.g., pulmonary embolus, asthma, emphysema)     Astma 9. PE RISK FACTORS: "Do you have a history of blood clots?" (or: recent major surgery, recent prolonged travel, bedridden)     No 10. OTHER SYMPTOMS: "Do you have any other symptoms?" (e.g., runny nose, wheezing, chest pain)       Some crackles 11. PREGNANCY: "Is there any chance you are pregnant?" "When was your last  menstrual period?"       n/a 12. TRAVEL: "Have you traveled out of the country in the last month?" (e.g., travel history, exposures)       No  Protocols used: COUGH - ACUTE NON-PRODUCTIVE-A-AH

## 2020-04-19 NOTE — Telephone Encounter (Signed)
FYI

## 2020-04-20 ENCOUNTER — Other Ambulatory Visit: Payer: Self-pay

## 2020-04-20 ENCOUNTER — Ambulatory Visit (INDEPENDENT_AMBULATORY_CARE_PROVIDER_SITE_OTHER): Payer: BC Managed Care – PPO | Admitting: Family Medicine

## 2020-04-20 ENCOUNTER — Encounter: Payer: Self-pay | Admitting: Family Medicine

## 2020-04-20 DIAGNOSIS — J40 Bronchitis, not specified as acute or chronic: Secondary | ICD-10-CM | POA: Diagnosis not present

## 2020-04-20 DIAGNOSIS — U071 COVID-19: Secondary | ICD-10-CM | POA: Diagnosis not present

## 2020-04-20 DIAGNOSIS — R053 Chronic cough: Secondary | ICD-10-CM | POA: Diagnosis not present

## 2020-04-20 MED ORDER — HYDROCOD POLST-CPM POLST ER 10-8 MG/5ML PO SUER: 5.0000 mL | Freq: Two times a day (BID) | ORAL | 0 refills | Status: DC | PRN

## 2020-04-20 MED ORDER — FLUTICASONE FUROATE-VILANTEROL 200-25 MCG/INH IN AEPB: 1.0000 | INHALATION_SPRAY | Freq: Every day | RESPIRATORY_TRACT | 0 refills | Status: DC

## 2020-04-20 NOTE — Progress Notes (Signed)
Virtual telephone visit    Virtual Visit via Telephone Note   This visit type was conducted due to national recommendations for restrictions regarding the COVID-19 Pandemic (e.g. social distancing) in an effort to limit this patient's exposure and mitigate transmission in our community. Due to his co-morbid illnesses, this patient is at least at moderate risk for complications without adequate follow up. This format is felt to be most appropriate for this patient at this time. The patient did not have access to video technology or had technical difficulties with video requiring transitioning to audio format only (telephone). Physical exam was limited to content and character of the telephone converstion.    Patient location: home Provider location: office  I discussed the limitations of evaluation and management by telemedicine and the availability of in person appointments. The patient expressed understanding and agreed to proceed.   Visit Date: 04/20/2020  Today's healthcare provider: Vernie Murders, PA-C   No chief complaint on file.  Subjective    HPI  Patient was diagnosed with Covid on 04/01/20.  He was treated with Antibiotic,Tussinex and albuterol inhaler. His symptoms have improved but he is still having persistent cough.  He is using his inhaler but states he does not think it is helping.  When asked if he is tasting the inhaler he said very much so.  It appears from what he is saying, he may not be using it correctly.  Discussed with him what he should be doing and he states he feels he will do better with it.   Patient states that the cough is keeping him up at night.  He still feels he is getting very winded.  Any exertion puts him out of breath.  When not exerting himself he states he can breath in and breath out ok, but said it feels like he is breathing through a filter.   Past Medical History:  Diagnosis Date  . Asthma   . Chronic kidney disease   . Headache   .  History of kidney stones   . Hypertension   . Nephrolithiasis   . Obesity   . Ulcerative colitis [facility])    Past Surgical History:  Procedure Laterality Date  . COLECTOMY    . COLONOSCOPY WITH PROPOFOL N/A 09/16/2014   Procedure: COLONOSCOPY WITH PROPOFOL;  Surgeon: Lollie Sails, MD;  Location: [facility] ENDOSCOPY;  Service: Endoscopy;  Laterality: N/A;  . FLEXIBLE SIGMOIDOSCOPY N/A 07/11/2017   Procedure: FLEXIBLE SIGMOIDOSCOPY;  Surgeon: Lollie Sails, MD;  Location: [facility] ENDOSCOPY;  Service: Endoscopy;  Laterality: N/A;  . FLEXIBLE SIGMOIDOSCOPY N/A 10/23/2017   Procedure: FLEXIBLE SIGMOIDOSCOPY;  Surgeon: Lollie Sails, MD;  Location: [facility] ENDOSCOPY;  Service: Endoscopy;  Laterality: N/A;  . FLEXIBLE SIGMOIDOSCOPY N/A 08/06/2019   Procedure: FLEXIBLE SIGMOIDOSCOPY;  Surgeon: Jonathon Bellows, MD;  Location: [facility] ENDOSCOPY;  Service: Gastroenterology;  Laterality: N/A;  . HERNIA REPAIR     History reviewed. No pertinent family history. Social History   Tobacco Use  . Smoking status: Never Smoker  . Smokeless tobacco: Never Used  Vaping Use  . Vaping Use: Never used  Substance Use Topics  . Alcohol use: Yes    Alcohol/week: 3.0 standard drinks    Types: 3 Cans of beer per week    Comment: Occassional  . Drug use: No   Allergies  Allergen Reactions  . Sulfa Antibiotics Hives    Increased HR and BP  . Sulfur Rash and Other (See Comments)    Elevated  BP and heart rate   History reviewed. No pertinent family history.    Medications: Outpatient Medications Prior to Visit  Medication Sig  . albuterol (VENTOLIN HFA) 108 (90 Base) MCG/ACT inhaler Inhale 2 puffs into the lungs every 6 (six) hours as needed for wheezing or shortness of breath.  . EPINEPHrine 0.3 mg/0.3 mL IJ SOAJ injection Inject 0.3 mLs (0.3 mg total) into the muscle as needed for anaphylaxis.  . Eszopiclone 3 MG TABS Take 1 tablet (3 mg total) by mouth at bedtime. Take immediately before bedtime  .  fluticasone furoate-vilanterol (BREO ELLIPTA) 100-25 MCG/INH AEPB Inhale 1 puff into the lungs daily.  Marland Kitchen gabapentin (NEURONTIN) 100 MG capsule Start with 1 tab (166m) PO q hs x 1 week, then increase to BID x 1 week, then TID  . lisinopril (ZESTRIL) 40 MG tablet Take 1 tablet (40 mg total) by mouth daily.  . methocarbamol (ROBAXIN) 500 MG tablet Take 1 tablet (500 mg total) by mouth every 8 (eight) hours as needed for muscle spasms.  . naproxen (NAPROSYN) 500 MG tablet Take 1 tablet (500 mg total) by mouth 2 (two) times daily with a meal.  . predniSONE (STERAPRED UNI-PAK 21 TAB) 10 MG (21) TBPK tablet PO: Take 6 tablets on day 1:Take 5 tablets day 2:Take 4 tablets day 3: Take 3 tablets day 4:Take 2 tablets day five: 5 Take 1 tablet day 6  . simvastatin (ZOCOR) 20 MG tablet TAKE 1 TABLET AT BEDTIME (SCHEDULE OFFICE VISIT BEFORE ANY FUTURE REFILLS)  . SUMAtriptan (IMITREX) 100 MG tablet Take 1 tablet (100 mg total) by mouth every 2 (two) hours as needed for migraine. May repeat in 2 hours if headache persists or recurs.  .Marland Kitchenamoxicillin-clavulanate (AUGMENTIN) 875-125 MG tablet Take 1 tablet by mouth 2 (two) times daily.  . chlorpheniramine-HYDROcodone (TUSSIONEX PENNKINETIC ER) 10-8 MG/5ML SUER Take 5 mLs by mouth every 12 (twelve) hours as needed for cough (will causre drowsiness.).   No facility-administered medications prior to visit.    Review of Systems  Constitutional: Positive for fatigue. Negative for chills, diaphoresis and fever.  HENT: Positive for sore throat and voice change. Negative for congestion, ear discharge, ear pain, facial swelling, postnasal drip, rhinorrhea, sinus pressure, sinus pain, sneezing and tinnitus.   Respiratory: Positive for cough and shortness of breath. Negative for wheezing.   Gastrointestinal: Negative for abdominal pain, constipation, diarrhea, nausea and vomiting.  Musculoskeletal: Negative for myalgias.  Neurological: Positive for dizziness. Negative for  headaches.      Objective    There were no vitals taken for this visit.  During telephonic interview, having hacking ticklish cough frequently. No audible wheeze.  Assessment & Plan     1. Persistent cough Developed a persistent hacking cough cough with positive COVID test 04-01-20. Cough improved for a short time after using Breo and Tussionex. Feeling dyspneic with dizziness after coughing hard. History of asthma as a child. May use Breo with Albuterol, Tussionex and check CXR for signs of pneumonia. - chlorpheniramine-HYDROcodone (TUSSIONEX PENNKINETIC ER) 10-8 MG/5ML SUER; Take 5 mLs by mouth every 12 (twelve) hours as needed for cough (will causre drowsiness.).  Dispense: 115 mL; Refill: 0 - fluticasone furoate-vilanterol (BREO ELLIPTA) 200-25 MCG/INH AEPB; Inhale 1 puff into the lungs daily.  Dispense: 28 each; Refill: 0 - DG Chest 2 View; Future  2. COVID-19 virus infection Positive COVID test as of 04-01-20. Persistent cough and dyspnea not uncommon with COVID infection. Refill meds and get CXR to rule out  pneumonia. May need pulmonology referral pending test results - fluticasone furoate-vilanterol (BREO ELLIPTA) 200-25 MCG/INH AEPB; Inhale 1 puff into the lungs daily.  Dispense: 28 each; Refill: 0 - DG Chest 2 View; Future  3. Bronchitis Cough for a couple weeks after having a positive COVID test. Recheck CXR and treat with Tussionex to control cough at night. Breo should help with dyspnea. Recheck pending CXR report. May need pulmonology referral if no better with this regimen. - chlorpheniramine-HYDROcodone (TUSSIONEX PENNKINETIC ER) 10-8 MG/5ML SUER; Take 5 mLs by mouth every 12 (twelve) hours as needed for cough (will causre drowsiness.).  Dispense: 115 mL; Refill: 0 - fluticasone furoate-vilanterol (BREO ELLIPTA) 200-25 MCG/INH AEPB; Inhale 1 puff into the lungs daily.  Dispense: 28 each; Refill: 0 - DG Chest 2 View; Future   No follow-ups on file.    I discussed the  assessment and treatment plan with the patient. The patient was provided an opportunity to ask questions and all were answered. The patient agreed with the plan and demonstrated an understanding of the instructions.   The patient was advised to call back or seek an in-person evaluation if the symptoms worsen or if the condition fails to improve as anticipated.  I provided 20 minutes of non-face-to-face time during this encounter.  I, Abram Sax, PA-C, have reviewed all documentation for this visit. The documentation on 04/20/20 for the exam, diagnosis, procedures, and orders are all accurate and complete.   Vernie Murders, PA-C [employer] [phone] (phone) [phone] (fax)  [place]

## 2020-04-21 ENCOUNTER — Other Ambulatory Visit: Payer: Self-pay

## 2020-04-21 ENCOUNTER — Ambulatory Visit
Admission: RE | Admit: 2020-04-21 | Discharge: 2020-04-21 | Disposition: A | Discharge status: Home / Self Care | Payer: BC Managed Care – PPO | Attending: Family Medicine | Admitting: Family Medicine

## 2020-04-21 ENCOUNTER — Ambulatory Visit
Admission: RE | Admit: 2020-04-21 | Discharge: 2020-04-21 | Disposition: A | Discharge status: Home / Self Care | Payer: BC Managed Care – PPO | Source: Ambulatory Visit | Attending: Family Medicine | Admitting: Family Medicine

## 2020-04-21 DIAGNOSIS — U071 COVID-19: Secondary | ICD-10-CM

## 2020-04-21 DIAGNOSIS — R053 Chronic cough: Secondary | ICD-10-CM | POA: Insufficient documentation

## 2020-04-21 DIAGNOSIS — R0602 Shortness of breath: Secondary | ICD-10-CM | POA: Diagnosis not present

## 2020-04-21 DIAGNOSIS — J40 Bronchitis, not specified as acute or chronic: Secondary | ICD-10-CM

## 2020-04-21 DIAGNOSIS — R059 Cough, unspecified: Secondary | ICD-10-CM | POA: Diagnosis not present

## 2020-04-25 ENCOUNTER — Encounter: Payer: Self-pay | Admitting: Family Medicine

## 2020-05-01 ENCOUNTER — Other Ambulatory Visit: Payer: Self-pay | Admitting: Family Medicine

## 2020-05-01 DIAGNOSIS — J40 Bronchitis, not specified as acute or chronic: Secondary | ICD-10-CM

## 2020-05-01 MED ORDER — AZITHROMYCIN 250 MG PO TABS: ORAL_TABLET | ORAL | 0 refills | Status: DC

## 2020-05-08 DIAGNOSIS — M5417 Radiculopathy, lumbosacral region: Secondary | ICD-10-CM | POA: Diagnosis not present

## 2020-05-08 DIAGNOSIS — Z6835 Body mass index (BMI) 35.0-35.9, adult: Secondary | ICD-10-CM | POA: Diagnosis not present

## 2020-05-08 DIAGNOSIS — M5416 Radiculopathy, lumbar region: Secondary | ICD-10-CM | POA: Diagnosis not present

## 2020-05-08 DIAGNOSIS — M5136 Other intervertebral disc degeneration, lumbar region: Secondary | ICD-10-CM | POA: Diagnosis not present

## 2020-05-18 ENCOUNTER — Ambulatory Visit (INDEPENDENT_AMBULATORY_CARE_PROVIDER_SITE_OTHER): Payer: BC Managed Care – PPO | Admitting: Physician Assistant

## 2020-05-18 ENCOUNTER — Ambulatory Visit: Payer: Self-pay | Admitting: Physician Assistant

## 2020-05-18 ENCOUNTER — Other Ambulatory Visit: Payer: Self-pay | Admitting: Family Medicine

## 2020-05-18 DIAGNOSIS — R197 Diarrhea, unspecified: Secondary | ICD-10-CM

## 2020-05-18 DIAGNOSIS — R109 Unspecified abdominal pain: Secondary | ICD-10-CM

## 2020-05-18 DIAGNOSIS — R053 Chronic cough: Secondary | ICD-10-CM

## 2020-05-18 DIAGNOSIS — U071 COVID-19: Secondary | ICD-10-CM

## 2020-05-18 DIAGNOSIS — J40 Bronchitis, not specified as acute or chronic: Secondary | ICD-10-CM

## 2020-05-18 MED ORDER — DICYCLOMINE HCL 10 MG PO CAPS
10.0000 mg | ORAL_CAPSULE | Freq: Three times a day (TID) | ORAL | 0 refills | Status: DC
Start: 2020-05-18 — End: 2020-08-24

## 2020-05-18 NOTE — Telephone Encounter (Signed)
Pt reports lower abdominal pain with right sided tenderness,onset Monday. States  pain worse with movement, bending. Lower abdominal pain constant, rates at 2/10, right sided area 6/10 with palpation. Also reports diarrhea 4-6 times daily, "Watery." Denies fever, pain does not radiate, no nausea or vomiting. Pt positive Covid in December. States similar issues last February, "Didn't find anything wrong" States, subsided on it's own, no intervention. Noted H/O colitis   Called and spoke with Eleana due to covid screening. Advised virtual with Ms. Pollak. Pt scheduled for 1520 this afternoo. Pt verbalizes understanding. Care advise given per protocol; advised ED if symptoms worsen.  Reason for Disposition . [1] MILD-MODERATE pain AND [2] constant AND [3] present > 2 hours  Answer Assessment - Initial Assessment Questions 1. LOCATION: "Where does it hurt?"      Across lower abdomen, greater right side 2. RADIATION: "Does the pain shoot anywhere else?" (e.g., chest, back)     No 3. ONSET: "When did the pain begin?" (Minutes, hours or days ago)      4 days ago, started Monday 4. SUDDEN: "Gradual or sudden onset?"     Constant, sudden 5. PATTERN "Does the pain come and go, or is it constant?"    - If constant: "Is it getting better, staying the same, or worsening?"      (Note: Constant means the pain never goes away completely; most serious pain is constant and it progresses)     - If intermittent: "How long does it last?" "Do you have pain now?"     (Note: Intermittent means the pain goes away completely between bouts)     Constant lower abd. 6. SEVERITY: "How bad is the pain?"  (e.g., Scale 1-10; mild, moderate, or severe)    - MILD (1-3): doesn't interfere with normal activities, abdomen soft and not tender to touch     - MODERATE (4-7): interferes with normal activities or awakens from sleep, tender to touch     - SEVERE (8-10): excruciating pain, doubled over, unable to do any normal  activities      Lower abd. 2/10. Bending forward 6/10. Right side tender with palpation. 7. RECURRENT SYMPTOM: "Have you ever had this type of stomach pain before?" If Yes, ask: "When was the last time?" and "What happened that time?"      "Last Feb. Subsided on own." 8. CAUSE: "What do you think is causing the stomach pain?"     Unsure 9. RELIEVING/AGGRAVATING FACTORS: "What makes it better or worse?" (e.g., movement, antacids, bowel movement)     Worse with movement 10. OTHER SYMPTOMS: "Has there been any vomiting, diarrhea, constipation, or urine problems?"       Diarrhea, Monday. 4-6 times daily; mostly watery  Protocols used: ABDOMINAL PAIN - MALE-A-AH

## 2020-05-18 NOTE — Progress Notes (Signed)
MyChart Video Visit    Virtual Visit via Video Note   This visit type was conducted due to national recommendations for restrictions regarding the COVID-19 Pandemic (e.g. social distancing) in an effort to limit this patient's exposure and mitigate transmission in our community. This patient is at least at moderate risk for complications without adequate follow up. This format is felt to be most appropriate for this patient at this time. Physical exam was limited by quality of the video and audio technology used for the visit.   Patient location: Home Provider location: Office   I discussed the limitations of evaluation and management by telemedicine and the availability of in person appointments. The patient expressed understanding and agreed to proceed.  Patient: Joel Terry   DOB: 05/23/1965   55 y.o. Male  MRN: [id number] Visit Date: 05/18/2020  Today's healthcare provider: Trinna Post, PA-C   Chief Complaint  Patient presents with  . Abdominal Pain  . Diarrhea   Subjective    HPI  Abdominal Pain: Patient with history of UC and total colectomy remotely complains of abdominal pain. The pain is described as constant, and is 5/10 in intensity. Pain is located in the RLQ without radiation. Onset was 3 days ago. Symptoms have been unchanged since. Aggravating factors: activity, movement and pressure.  Alleviating factors: lying in 45 degree position and positions that do not put pressure on stomach. Associated symptoms: none. The patient denies chills, dysuria, fever, nausea, sweats and vomiting. He had a similar episode one year ago, underwent RUQ [place] which was normal and CT abdomen which was normal. He did not have an appendix.       Medications: Outpatient Medications Prior to Visit  Medication Sig  . albuterol (VENTOLIN HFA) 108 (90 Base) MCG/ACT inhaler Inhale 2 puffs into the lungs every 6 (six) hours as needed for wheezing or shortness of breath.  Marland Kitchen azithromycin  (ZITHROMAX) 250 MG tablet Take 2 tablets by mouth the first day, then 1 daily for 4 days.  Marland Kitchen BREO ELLIPTA 200-25 MCG/INH AEPB INHALE 1 PUFF INTO THE LUNGS DAILY  . chlorpheniramine-HYDROcodone (TUSSIONEX PENNKINETIC ER) 10-8 MG/5ML SUER Take 5 mLs by mouth every 12 (twelve) hours as needed for cough (will causre drowsiness.).  Marland Kitchen EPINEPHrine 0.3 mg/0.3 mL IJ SOAJ injection Inject 0.3 mLs (0.3 mg total) into the muscle as needed for anaphylaxis.  . Eszopiclone 3 MG TABS Take 1 tablet (3 mg total) by mouth at bedtime. Take immediately before bedtime  . gabapentin (NEURONTIN) 100 MG capsule Start with 1 tab (150m) PO q hs x 1 week, then increase to BID x 1 week, then TID  . lisinopril (ZESTRIL) 40 MG tablet Take 1 tablet (40 mg total) by mouth daily.  . methocarbamol (ROBAXIN) 500 MG tablet Take 1 tablet (500 mg total) by mouth every 8 (eight) hours as needed for muscle spasms.  . naproxen (NAPROSYN) 500 MG tablet Take 1 tablet (500 mg total) by mouth 2 (two) times daily with a meal.  . predniSONE (STERAPRED UNI-PAK 21 TAB) 10 MG (21) TBPK tablet PO: Take 6 tablets on day 1:Take 5 tablets day 2:Take 4 tablets day 3: Take 3 tablets day 4:Take 2 tablets day five: 5 Take 1 tablet day 6  . simvastatin (ZOCOR) 20 MG tablet TAKE 1 TABLET AT BEDTIME (SCHEDULE OFFICE VISIT BEFORE ANY FUTURE REFILLS)  . SUMAtriptan (IMITREX) 100 MG tablet Take 1 tablet (100 mg total) by mouth every 2 (two) hours as needed  for migraine. May repeat in 2 hours if headache persists or recurs.   No facility-administered medications prior to visit.    Review of Systems    Objective    There were no vitals taken for this visit.   Physical Exam Constitutional:      Appearance: Normal appearance.  Pulmonary:     Effort: Pulmonary effort is normal. No respiratory distress.  Neurological:     Mental Status: He is alert.  Psychiatric:        Mood and Affect: Mood normal.        Behavior: Behavior normal.         Assessment & Plan    1. Diarrhea, unspecified type  No appendix, no sigmoid colon, no aneurysms on prior image. May be viral gastroenteritis and/or intestinal spasms. Patient is comfortable with trial of Bentyl for spasm and following up in the morning to see how he is doing.   - dicyclomine (BENTYL) 10 MG capsule; Take 1 capsule (10 mg total) by mouth 4 (four) times daily -  before meals and at bedtime.  Dispense: 90 capsule; Refill: 0  2. Abdominal pain, unspecified abdominal location  - dicyclomine (BENTYL) 10 MG capsule; Take 1 capsule (10 mg total) by mouth 4 (four) times daily -  before meals and at bedtime.  Dispense: 90 capsule; Refill: 0  I, Trinna Post, PA-C, have reviewed all documentation for this visit. The documentation on 05/19/20 for the exam, diagnosis, procedures, and orders are all accurate and complete.  The entirety of the information documented in the History of Present Illness, Review of Systems and Physical Exam were personally obtained by me. Portions of this information were initially documented by [facility] and reviewed by me for thoroughness and accuracy.   Return if symptoms worsen or fail to improve.     I discussed the assessment and treatment plan with the patient. The patient was provided an opportunity to ask questions and all were answered. The patient agreed with the plan and demonstrated an understanding of the instructions.   The patient was advised to call back or seek an in-person evaluation if the symptoms worsen or if the condition fails to improve as anticipated.    Paulene Floor [facility] [phone] (phone) [phone] (fax)  [place]

## 2020-05-18 NOTE — Telephone Encounter (Signed)
Requested medications are due for refill today yes  Requested medications are on the active medication list yes  Last refill 1/6  Last visit Jan 2022  Future visit scheduled no  Notes to clinic Unsure if to be continued.

## 2020-05-19 ENCOUNTER — Encounter: Payer: Self-pay | Admitting: Physician Assistant

## 2020-06-19 DIAGNOSIS — M5117 Intervertebral disc disorders with radiculopathy, lumbosacral region: Secondary | ICD-10-CM | POA: Diagnosis not present

## 2020-06-19 DIAGNOSIS — M4807 Spinal stenosis, lumbosacral region: Secondary | ICD-10-CM | POA: Diagnosis not present

## 2020-06-19 DIAGNOSIS — M488X7 Other specified spondylopathies, lumbosacral region: Secondary | ICD-10-CM | POA: Diagnosis not present

## 2020-06-19 DIAGNOSIS — M5417 Radiculopathy, lumbosacral region: Secondary | ICD-10-CM | POA: Diagnosis not present

## 2020-06-19 DIAGNOSIS — M48061 Spinal stenosis, lumbar region without neurogenic claudication: Secondary | ICD-10-CM | POA: Diagnosis not present

## 2020-06-19 DIAGNOSIS — M5116 Intervertebral disc disorders with radiculopathy, lumbar region: Secondary | ICD-10-CM | POA: Diagnosis not present

## 2020-06-23 DIAGNOSIS — M5136 Other intervertebral disc degeneration, lumbar region: Secondary | ICD-10-CM | POA: Diagnosis not present

## 2020-06-23 DIAGNOSIS — M5416 Radiculopathy, lumbar region: Secondary | ICD-10-CM | POA: Diagnosis not present

## 2020-06-26 DIAGNOSIS — I10 Essential (primary) hypertension: Secondary | ICD-10-CM | POA: Diagnosis not present

## 2020-06-26 DIAGNOSIS — J45909 Unspecified asthma, uncomplicated: Secondary | ICD-10-CM | POA: Diagnosis not present

## 2020-06-26 DIAGNOSIS — Z79899 Other long term (current) drug therapy: Secondary | ICD-10-CM | POA: Diagnosis not present

## 2020-06-26 DIAGNOSIS — E785 Hyperlipidemia, unspecified: Secondary | ICD-10-CM | POA: Diagnosis not present

## 2020-06-26 DIAGNOSIS — M5416 Radiculopathy, lumbar region: Secondary | ICD-10-CM | POA: Diagnosis not present

## 2020-06-26 DIAGNOSIS — Z882 Allergy status to sulfonamides status: Secondary | ICD-10-CM | POA: Diagnosis not present

## 2020-06-26 DIAGNOSIS — Z87442 Personal history of urinary calculi: Secondary | ICD-10-CM | POA: Diagnosis not present

## 2020-07-05 DIAGNOSIS — R262 Difficulty in walking, not elsewhere classified: Secondary | ICD-10-CM | POA: Diagnosis not present

## 2020-07-05 DIAGNOSIS — M799 Soft tissue disorder, unspecified: Secondary | ICD-10-CM | POA: Diagnosis not present

## 2020-07-05 DIAGNOSIS — M545 Low back pain, unspecified: Secondary | ICD-10-CM | POA: Diagnosis not present

## 2020-07-05 DIAGNOSIS — M6281 Muscle weakness (generalized): Secondary | ICD-10-CM | POA: Diagnosis not present

## 2020-07-06 DIAGNOSIS — M545 Low back pain, unspecified: Secondary | ICD-10-CM | POA: Diagnosis not present

## 2020-07-06 DIAGNOSIS — M6281 Muscle weakness (generalized): Secondary | ICD-10-CM | POA: Diagnosis not present

## 2020-07-06 DIAGNOSIS — R262 Difficulty in walking, not elsewhere classified: Secondary | ICD-10-CM | POA: Diagnosis not present

## 2020-07-06 DIAGNOSIS — M799 Soft tissue disorder, unspecified: Secondary | ICD-10-CM | POA: Diagnosis not present

## 2020-07-10 DIAGNOSIS — R262 Difficulty in walking, not elsewhere classified: Secondary | ICD-10-CM | POA: Diagnosis not present

## 2020-07-10 DIAGNOSIS — M545 Low back pain, unspecified: Secondary | ICD-10-CM | POA: Diagnosis not present

## 2020-07-10 DIAGNOSIS — M6281 Muscle weakness (generalized): Secondary | ICD-10-CM | POA: Diagnosis not present

## 2020-07-10 DIAGNOSIS — M799 Soft tissue disorder, unspecified: Secondary | ICD-10-CM | POA: Diagnosis not present

## 2020-07-11 ENCOUNTER — Other Ambulatory Visit: Payer: Self-pay

## 2020-07-11 ENCOUNTER — Ambulatory Visit: Payer: BC Managed Care – PPO | Admitting: Adult Health

## 2020-07-11 ENCOUNTER — Encounter: Payer: Self-pay | Admitting: Adult Health

## 2020-07-11 VITALS — BP 127/107 | HR 95 | Resp 16 | Wt 243.2 lb

## 2020-07-11 DIAGNOSIS — E785 Hyperlipidemia, unspecified: Secondary | ICD-10-CM

## 2020-07-11 DIAGNOSIS — E7800 Pure hypercholesterolemia, unspecified: Secondary | ICD-10-CM | POA: Diagnosis not present

## 2020-07-11 DIAGNOSIS — I10 Essential (primary) hypertension: Secondary | ICD-10-CM | POA: Diagnosis not present

## 2020-07-11 MED ORDER — LISINOPRIL 40 MG PO TABS: 40.0000 mg | ORAL_TABLET | Freq: Every day | ORAL | 0 refills | Status: DC

## 2020-07-11 MED ORDER — HYDROCHLOROTHIAZIDE 25 MG PO TABS: 25.0000 mg | ORAL_TABLET | Freq: Every day | ORAL | 0 refills | Status: DC

## 2020-07-11 MED ORDER — SIMVASTATIN 20 MG PO TABS
20.0000 mg | ORAL_TABLET | Freq: Every day | ORAL | 1 refills | Status: DC
Start: 2020-07-11 — End: 2021-01-17

## 2020-07-11 NOTE — Patient Instructions (Signed)
High Cholesterol  High cholesterol is a condition in which the blood has high levels of a white, waxy substance similar to fat (cholesterol). The liver makes all the cholesterol that the body needs. The human body needs small amounts of cholesterol to help build cells. A person gets extra or excess cholesterol from the food that he or she eats. The blood carries cholesterol from the liver to the rest of the body. If you have high cholesterol, deposits (plaques) may build up on the walls of your arteries. Arteries are the blood vessels that carry blood away from your heart. These plaques make the arteries narrow and stiff. Cholesterol plaques increase your risk for heart attack and stroke. Work with your health care provider to keep your cholesterol levels in a healthy range. What increases the risk? The following factors may make you more likely to develop this condition:  Eating foods that are high in animal fat (saturated fat) or cholesterol.  Being overweight.  Not getting enough exercise.  A family history of high cholesterol (familial hypercholesterolemia).  Use of tobacco products.  Having diabetes. What are the signs or symptoms? There are no symptoms of this condition. How is this diagnosed? This condition may be diagnosed based on the results of a blood test.  If you are older than 53 years of age, your health care provider may check your cholesterol levels every 4-6 years.  You may be checked more often if you have high cholesterol or other risk factors for heart disease. The blood test for cholesterol measures:  "Bad" cholesterol, or LDL cholesterol. This is the main type of cholesterol that causes heart disease. The desired level is less than 100 mg/dL.  "Good" cholesterol, or HDL cholesterol. HDL helps protect against heart disease by cleaning the arteries and carrying the LDL to the liver for processing. The desired level for HDL is 60 mg/dL or higher.  Triglycerides.  These are fats that your body can store or burn for energy. The desired level is less than 150 mg/dL.  Total cholesterol. This measures the total amount of cholesterol in your blood and includes LDL, HDL, and triglycerides. The desired level is less than 200 mg/dL. How is this treated? This condition may be treated with:  Diet changes. You may be asked to eat foods that have more fiber and less saturated fats or added sugar.  Lifestyle changes. These may include regular exercise, maintaining a healthy weight, and quitting use of tobacco products.  Medicines. These are given when diet and lifestyle changes have not worked. You may be prescribed a statin medicine to help lower your cholesterol levels. Follow these instructions at home: Eating and drinking  Eat a healthy, balanced diet. This diet includes: ? Daily servings of a variety of fresh, frozen, or canned fruits and vegetables. ? Daily servings of whole grain foods that are rich in fiber. ? Foods that are low in saturated fats and trans fats. These include poultry and fish without skin, lean cuts of meat, and low-fat dairy products. ? A variety of fish, especially oily fish that contain omega-3 fatty acids. Aim to eat fish at least 2 times a week.  Avoid foods and drinks that have added sugar.  Use healthy cooking methods, such as roasting, grilling, broiling, baking, poaching, steaming, and stir-frying. Do not fry your food except for stir-frying.   Lifestyle  Get regular exercise. Aim to exercise for a total of 150 minutes a week. Increase your activity level by doing activities  such as gardening, walking, and taking the stairs.  Do not use any products that contain nicotine or tobacco, such as cigarettes, e-cigarettes, and chewing tobacco. If you need help quitting, ask your health care provider.   General instructions  Take over-the-counter and prescription medicines only as told by your health care provider.  Keep all  follow-up visits as told by your health care provider. This is important. Where to find more information  American Heart Association: www.heart.org  National Heart, Lung, and Blood Institute: https://wilson-eaton.com/ Contact a health care provider if:  You have trouble achieving or maintaining a healthy diet or weight.  You are starting an exercise program.  You are unable to stop smoking. Get help right away if:  You have chest pain.  You have trouble breathing.  You have any symptoms of a stroke. "BE FAST" is an easy way to remember the main warning signs of a stroke: ? B - Balance. Signs are dizziness, sudden trouble walking, or loss of balance. ? E - Eyes. Signs are trouble seeing or a sudden change in vision. ? F - Face. Signs are sudden weakness or numbness of the face, or the face or eyelid drooping on one side. ? A - Arms. Signs are weakness or numbness in an arm. This happens suddenly and usually on one side of the body. ? S - Speech. Signs are sudden trouble speaking, slurred speech, or trouble understanding what people say. ? T - Time. Time to call emergency services. Write down what time symptoms started.  You have other signs of a stroke, such as: ? A sudden, severe headache with no known cause. ? Nausea or vomiting. ? Seizure. These symptoms may represent a serious problem that is an emergency. Do not wait to see if the symptoms will go away. Get medical help right away. Call your local emergency services (911 in the [place] Do not drive yourself to the hospital. Summary  Cholesterol plaques increase your risk for heart attack and stroke. Work with your health care provider to keep your cholesterol levels in a healthy range.  Eat a healthy, balanced diet, get regular exercise, and maintain a healthy weight.  Do not use any products that contain nicotine or tobacco, such as cigarettes, e-cigarettes, and chewing tobacco.  Get help right away if you have any symptoms of a  stroke. This information is not intended to replace advice given to you by your health care provider. Make sure you discuss any questions you have with your health care provider. Document Revised: 03/01/2019 Document Reviewed: 03/01/2019 Elsevier Patient Education  2021 [place]. Hypertension, Adult Hypertension is another name for high blood pressure. High blood pressure forces your heart to work harder to pump blood. This can cause problems over time. There are two numbers in a blood pressure reading. There is a top number (systolic) over a bottom number (diastolic). It is best to have a blood pressure that is below 120/80. Healthy choices can help lower your blood pressure, or you may need medicine to help lower it. What are the causes? The cause of this condition is not known. Some conditions may be related to high blood pressure. What increases the risk?  Smoking.  Having type 2 diabetes mellitus, high cholesterol, or both.  Not getting enough exercise or physical activity.  Being overweight.  Having too much fat, sugar, calories, or salt (sodium) in your diet.  Drinking too much alcohol.  Having long-term (chronic) kidney disease.  Having a family history  of high blood pressure.  Age. Risk increases with age.  Race. You may be at higher risk if you are African American.  Gender. Men are at higher risk than women before age 52. After age 71, women are at higher risk than men.  Having obstructive sleep apnea.  Stress. What are the signs or symptoms?  High blood pressure may not cause symptoms. Very high blood pressure (hypertensive crisis) may cause: ? Headache. ? Feelings of worry or nervousness (anxiety). ? Shortness of breath. ? Nosebleed. ? A feeling of being sick to your stomach (nausea). ? Throwing up (vomiting). ? Changes in how you see. ? Very bad chest pain. ? Seizures. How is this treated?  This condition is treated by making healthy lifestyle  changes, such as: ? Eating healthy foods. ? Exercising more. ? Drinking less alcohol.  Your health care provider may prescribe medicine if lifestyle changes are not enough to get your blood pressure under control, and if: ? Your top number is above 130. ? Your bottom number is above 80.  Your personal target blood pressure may vary. Follow these instructions at home: Eating and drinking  If told, follow the DASH eating plan. To follow this plan: ? Fill one half of your plate at each meal with fruits and vegetables. ? Fill one fourth of your plate at each meal with whole grains. Whole grains include whole-wheat pasta, brown rice, and whole-grain bread. ? Eat or drink low-fat dairy products, such as skim milk or low-fat yogurt. ? Fill one fourth of your plate at each meal with low-fat (lean) proteins. Low-fat proteins include fish, chicken without skin, eggs, beans, and tofu. ? Avoid fatty meat, cured and processed meat, or chicken with skin. ? Avoid pre-made or processed food.  Eat less than 1,500 mg of salt each day.  Do not drink alcohol if: ? Your doctor tells you not to drink. ? You are pregnant, may be pregnant, or are planning to become pregnant.  If you drink alcohol: ? Limit how much you use to:  0-1 drink a day for women.  0-2 drinks a day for men. ? Be aware of how much alcohol is in your drink. In the [place] one drink equals one 12 oz bottle of beer (355 mL), one 5 oz glass of wine (148 mL), or one 1 oz glass of hard liquor (44 mL).   Lifestyle  Work with your doctor to stay at a healthy weight or to lose weight. Ask your doctor what the best weight is for you.  Get at least 30 minutes of exercise most days of the week. This may include walking, swimming, or biking.  Get at least 30 minutes of exercise that strengthens your muscles (resistance exercise) at least 3 days a week. This may include lifting weights or doing Pilates.  Do not use any products that contain  nicotine or tobacco, such as cigarettes, e-cigarettes, and chewing tobacco. If you need help quitting, ask your doctor.  Check your blood pressure at home as told by your doctor.  Keep all follow-up visits as told by your doctor. This is important.   Medicines  Take over-the-counter and prescription medicines only as told by your doctor. Follow directions carefully.  Do not skip doses of blood pressure medicine. The medicine does not work as well if you skip doses. Skipping doses also puts you at risk for problems.  Ask your doctor about side effects or reactions to medicines that you should watch for.  Contact a doctor if you:  Think you are having a reaction to the medicine you are taking.  Have headaches that keep coming back (recurring).  Feel dizzy.  Have swelling in your ankles.  Have trouble with your vision. Get help right away if you:  Get a very bad headache.  Start to feel mixed up (confused).  Feel weak or numb.  Feel faint.  Have very bad pain in your: ? Chest. ? Belly (abdomen).  Throw up more than once.  Have trouble breathing. Summary  Hypertension is another name for high blood pressure.  High blood pressure forces your heart to work harder to pump blood.  For most people, a normal blood pressure is less than 120/80.  Making healthy choices can help lower blood pressure. If your blood pressure does not get lower with healthy choices, you may need to take medicine. This information is not intended to replace advice given to you by your health care provider. Make sure you discuss any questions you have with your health care provider. Document Revised: 12/10/2017 Document Reviewed: 12/10/2017 Elsevier Patient Education  2021 [employer].

## 2020-07-11 NOTE — Progress Notes (Signed)
Established patient visit   Patient: Joel Terry   DOB: 08-Dec-1965   55 y.o. Male  MRN: [id number] Visit Date: 07/11/2020  Today's healthcare provider: Marcille Buffy, FNP   Chief Complaint  Patient presents with  . Hypertension   Subjective    HPI  Hypertension, follow-up  BP Readings from Last 3 Encounters:  07/11/20 (!) 127/107  02/18/20 (!) 153/100  11/25/19 (!) 164/102   Wt Readings from Last 3 Encounters:  07/11/20 243 lb 3.2 oz (110.3 kg)  04/03/20 235 lb (106.6 kg)  02/18/20 240 lb 12.8 oz (109.2 kg)     He was last seen for hypertension 9 months ago.  BP at that visit was 138/70. Management since that visit includes none.  He reports excellent compliance with treatment. He is not having side effects.  He is following a Regular diet. He is not exercising. He does not smoke. Having back surgery in a couple of weeks. He has gained weight.  Use of agents associated with hypertension: none.   Outside blood pressures are not being checked.  Patient  denies any fever, body aches,chills, rash, chest pain, shortness of breath, nausea, vomiting, or diarrhea.  Denies dizziness, lightheadedness, pre syncopal or syncopal episodes.   Symptoms: No chest pain No chest pressure  No palpitations No syncope  No dyspnea No orthopnea  No paroxysmal nocturnal dyspnea No lower extremity edema   Pertinent labs: Lab Results  Component Value Date   CHOL 137 08/12/2019   HDL 29 (L) 08/12/2019   LDLCALC 90 08/12/2019   TRIG 96 08/12/2019   CHOLHDL 4.1 01/10/2017   Lab Results  Component Value Date   NA 138 08/09/2019   K 3.9 08/09/2019   CREATININE 1.08 08/09/2019   GFRNONAA >60 08/09/2019   GFRAA >60 08/09/2019   GLUCOSE 109 (H) 08/09/2019     The 10-year ASCVD risk score Mikey Bussing DC Jr., et al., 2013) is: 5.1%   --------------------------------------------------------------------------------------------------- Lipid/Cholesterol, Follow-up  Last  lipid panel Other pertinent labs  Lab Results  Component Value Date   CHOL 137 08/12/2019   HDL 29 (L) 08/12/2019   LDLCALC 90 08/12/2019   TRIG 96 08/12/2019   CHOLHDL 4.1 01/10/2017   Lab Results  Component Value Date   ALT 85 (H) 08/12/2019   AST 60 (H) 08/12/2019   PLT 388 08/09/2019   TSH 0.56 01/10/2017     He was last seen for this 11 months ago.  Management since that visit includes none.  He reports excellent compliance with treatment. He is not having side effects.   Symptoms: No chest pain No chest pressure/discomfort  No dyspnea No lower extremity edema  No numbness or tingling of extremity No orthopnea  No palpitations No paroxysmal nocturnal dyspnea  No speech difficulty No syncope   Current diet: in general, an "unhealthy" diet Current exercise: no regular exercise  The 10-year ASCVD risk score Mikey Bussing DC Jr., et al., 2013) is: 5.1%  ---------------------------------------------------------------------------------------------------    Medications: Outpatient Medications Prior to Visit  Medication Sig  . albuterol (VENTOLIN HFA) 108 (90 Base) MCG/ACT inhaler Inhale 2 puffs into the lungs every 6 (six) hours as needed for wheezing or shortness of breath.  . dicyclomine (BENTYL) 10 MG capsule Take 1 capsule (10 mg total) by mouth 4 (four) times daily -  before meals and at bedtime.  Marland Kitchen EPINEPHrine 0.3 mg/0.3 mL IJ SOAJ injection Inject 0.3 mLs (0.3 mg total) into the muscle as needed for  anaphylaxis.  . Eszopiclone 3 MG TABS Take 1 tablet (3 mg total) by mouth at bedtime. Take immediately before bedtime  . gabapentin (NEURONTIN) 300 MG capsule Take by mouth.  . methocarbamol (ROBAXIN) 500 MG tablet Take 1 tablet (500 mg total) by mouth every 8 (eight) hours as needed for muscle spasms.  . naproxen (NAPROSYN) 500 MG tablet Take 1 tablet (500 mg total) by mouth 2 (two) times daily with a meal.  . SUMAtriptan (IMITREX) 100 MG tablet Take 1 tablet (100 mg total) by  mouth every 2 (two) hours as needed for migraine. May repeat in 2 hours if headache persists or recurs.  . [DISCONTINUED] lisinopril (ZESTRIL) 40 MG tablet Take 1 tablet (40 mg total) by mouth daily.  . [DISCONTINUED] simvastatin (ZOCOR) 20 MG tablet TAKE 1 TABLET AT BEDTIME (SCHEDULE OFFICE VISIT BEFORE ANY FUTURE REFILLS)  . BREO ELLIPTA 200-25 MCG/INH AEPB INHALE 1 PUFF INTO THE LUNGS DAILY (Patient not taking: Reported on 07/11/2020)  . diclofenac (VOLTAREN) 75 MG EC tablet Take 75 mg by mouth 2 (two) times daily. (Patient not taking: Reported on 07/11/2020)  . [DISCONTINUED] azithromycin (ZITHROMAX) 250 MG tablet Take 2 tablets by mouth the first day, then 1 daily for 4 days.  . [DISCONTINUED] chlorpheniramine-HYDROcodone (TUSSIONEX PENNKINETIC ER) 10-8 MG/5ML SUER Take 5 mLs by mouth every 12 (twelve) hours as needed for cough (will causre drowsiness.).  . [DISCONTINUED] gabapentin (NEURONTIN) 100 MG capsule Start with 1 tab (189m) PO q hs x 1 week, then increase to BID x 1 week, then TID  . [DISCONTINUED] predniSONE (STERAPRED UNI-PAK 21 TAB) 10 MG (21) TBPK tablet PO: Take 6 tablets on day 1:Take 5 tablets day 2:Take 4 tablets day 3: Take 3 tablets day 4:Take 2 tablets day five: 5 Take 1 tablet day 6   No facility-administered medications prior to visit.    Review of Systems  Constitutional: Negative.   HENT: Negative.   Respiratory: Negative.   Gastrointestinal: Negative.   Genitourinary: Negative.   Musculoskeletal: Positive for arthralgias and back pain.  Neurological: Negative.   Hematological: Negative.   Psychiatric/Behavioral: Negative.     Last CBC Lab Results  Component Value Date   WBC 6.7 08/09/2019   HGB 16.7 08/09/2019   HCT 47.9 08/09/2019   MCV 90.2 08/09/2019   MCH 31.5 08/09/2019   RDW 13.3 08/09/2019   PLT 388 099/37/1696  Last metabolic panel Lab Results  Component Value Date   GLUCOSE 109 (H) 08/09/2019   NA 138 08/09/2019   K 3.9 08/09/2019   CL  107 08/09/2019   CO2 24 08/09/2019   BUN 16 08/09/2019   CREATININE 1.08 08/09/2019   GFRNONAA >60 08/09/2019   GFRAA >60 08/09/2019   CALCIUM 9.1 08/09/2019   PROT 6.9 08/12/2019   ALBUMIN 4.4 08/12/2019   LABGLOB 2.6 07/14/2015   AGRATIO 1.5 07/14/2015   BILITOT 0.4 08/12/2019   ALKPHOS 74 08/12/2019   AST 60 (H) 08/12/2019   ALT 85 (H) 08/12/2019   ANIONGAP 7 08/09/2019   Last thyroid functions Lab Results  Component Value Date   TSH 0.56 01/10/2017       Objective    BP (!) 127/107   Pulse 95   Resp 16   Wt 243 lb 3.2 oz (110.3 kg)   BMI 36.98 kg/m  BP Readings from Last 3 Encounters:  07/11/20 (!) 127/107  02/18/20 (!) 153/100  11/25/19 (!) 164/102   Wt Readings from Last 3 Encounters:  07/11/20  243 lb 3.2 oz (110.3 kg)  04/03/20 235 lb (106.6 kg)  02/18/20 240 lb 12.8 oz (109.2 kg)       Physical Exam Constitutional:      General: He is not in acute distress.    Appearance: Normal appearance. He is well-developed. He is not ill-appearing, toxic-appearing or diaphoretic.  HENT:     Head: Normocephalic and atraumatic.     Right Ear: Hearing, tympanic membrane, ear canal and external ear normal.     Left Ear: Hearing, tympanic membrane, ear canal and external ear normal.     Nose: Nose normal.     Mouth/Throat:     Pharynx: Uvula midline. No oropharyngeal exudate.  Eyes:     General: Lids are normal. No scleral icterus.       Right eye: No discharge.        Left eye: No discharge.     Conjunctiva/sclera: Conjunctivae normal.     Pupils: Pupils are equal, round, and reactive to light.  Neck:     Thyroid: No thyromegaly.     Vascular: Normal carotid pulses. No carotid bruit, hepatojugular reflux or JVD.     Trachea: Trachea and phonation normal. No tracheal tenderness or tracheal deviation.     Meningeal: Brudzinski's sign absent.  Cardiovascular:     Rate and Rhythm: Normal rate and regular rhythm.     Pulses: Normal pulses.     Heart sounds:  Normal heart sounds, S1 normal and S2 normal. Heart sounds not distant. No murmur heard. No friction rub. No gallop.   Pulmonary:     Effort: Pulmonary effort is normal. No accessory muscle usage or respiratory distress.     Breath sounds: Normal breath sounds. No stridor. No wheezing or rales.  Chest:     Chest wall: No tenderness.  Abdominal:     General: Bowel sounds are normal. There is no distension.     Palpations: Abdomen is soft. There is no mass.     Tenderness: There is no abdominal tenderness. There is no guarding or rebound.  Musculoskeletal:        General: No tenderness or deformity. Normal range of motion.     Cervical back: Full passive range of motion without pain, normal range of motion and neck supple.  Lymphadenopathy:     Head:     Right side of head: No submental, submandibular, tonsillar, preauricular, posterior auricular or occipital adenopathy.     Left side of head: No submental, submandibular, tonsillar, preauricular, posterior auricular or occipital adenopathy.     Cervical: No cervical adenopathy.  Skin:    General: Skin is warm and dry.     Coloration: Skin is not pale.     Findings: No erythema or rash.     Nails: There is no clubbing.  Neurological:     Mental Status: He is alert and oriented to person, place, and time.     GCS: GCS eye subscore is 4. GCS verbal subscore is 5. GCS motor subscore is 6.     Cranial Nerves: No cranial nerve deficit.     Sensory: No sensory deficit.     Motor: No abnormal muscle tone.     Coordination: Coordination normal.     Deep Tendon Reflexes: Reflexes are normal and symmetric. Reflexes normal.  Psychiatric:        Speech: Speech normal.        Behavior: Behavior normal.        Thought Content: Thought  content normal.        Judgment: Judgment normal.      No results found for any visits on 07/11/20.  Assessment & Plan     Hyperlipidemia, unspecified hyperlipidemia type - Plan: CBC with  Differential/Platelet, TSH, Comprehensive Metabolic Panel (CMET), Lipid Panel w/o Chol/HDL Ratio  Benign essential HTN - Plan: CBC with Differential/Platelet, TSH, Comprehensive Metabolic Panel (CMET), Lipid Panel w/o Chol/HDL Ratio  Essential hypertension - Plan: hydrochlorothiazide (HYDRODIURIL) 25 MG tablet, lisinopril (ZESTRIL) 40 MG tablet  Pure hypercholesterolemia - Plan: simvastatin (ZOCOR) 20 MG tablet  Meds ordered this encounter  Medications  . hydrochlorothiazide (HYDRODIURIL) 25 MG tablet    Sig: Take 1 tablet (25 mg total) by mouth daily.    Dispense:  90 tablet    Refill:  0  . lisinopril (ZESTRIL) 40 MG tablet    Sig: Take 1 tablet (40 mg total) by mouth daily.    Dispense:  90 tablet    Refill:  0  . simvastatin (ZOCOR) 20 MG tablet    Sig: Take 1 tablet (20 mg total) by mouth daily at 6 PM.    Dispense:  90 tablet    Refill:  1  add HCTZ to lisinopril. Keep log at home of blood pressure.  Has back pain having discectomy The patient is advised to begin progressive daily aerobic exercise program, follow a low fat, low cholesterol diet, attempt to lose weight, reduce salt in diet and cooking, improve dietary compliance, continue current medications, continue current healthy lifestyle patterns and return for routine annual checkups.Scarlett Presto Flags discussed. The patient was given clear instructions to go to ER or return to medical center if any red flags develop, symptoms do not improve, worsen or new problems develop. They verbalized understanding.  Labs fasting as soon as possible.   Orders Placed This Encounter  Procedures  . CBC with Differential/Platelet  . TSH  . Comprehensive Metabolic Panel (CMET)  . Lipid Panel w/o Chol/HDL Ratio   Return in about 2 weeks (around 07/25/2020), or if symptoms worsen or fail to improve, for at any time for any worsening symptoms, Go to Emergency room/ urgent care if worse.      The entirety of the information documented in  the History of Present Illness, Review of Systems and Physical Exam were personally obtained by me. Portions of this information were initially documented by the CMA and reviewed by me for thoroughness and accuracy.    Marcille Buffy, [place] [phone] (phone) [phone] (fax)  [place]

## 2020-07-20 DIAGNOSIS — M5127 Other intervertebral disc displacement, lumbosacral region: Secondary | ICD-10-CM | POA: Diagnosis not present

## 2020-07-20 DIAGNOSIS — Z882 Allergy status to sulfonamides status: Secondary | ICD-10-CM | POA: Diagnosis not present

## 2020-07-20 DIAGNOSIS — M2578 Osteophyte, vertebrae: Secondary | ICD-10-CM | POA: Diagnosis not present

## 2020-07-20 DIAGNOSIS — E785 Hyperlipidemia, unspecified: Secondary | ICD-10-CM | POA: Diagnosis not present

## 2020-07-20 DIAGNOSIS — M50322 Other cervical disc degeneration at C5-C6 level: Secondary | ICD-10-CM | POA: Diagnosis not present

## 2020-07-20 DIAGNOSIS — Z6837 Body mass index (BMI) 37.0-37.9, adult: Secondary | ICD-10-CM | POA: Diagnosis not present

## 2020-07-20 DIAGNOSIS — M5416 Radiculopathy, lumbar region: Secondary | ICD-10-CM | POA: Diagnosis not present

## 2020-07-20 DIAGNOSIS — I10 Essential (primary) hypertension: Secondary | ICD-10-CM | POA: Diagnosis not present

## 2020-07-20 DIAGNOSIS — M5412 Radiculopathy, cervical region: Secondary | ICD-10-CM | POA: Diagnosis not present

## 2020-07-20 DIAGNOSIS — M5114 Intervertebral disc disorders with radiculopathy, thoracic region: Secondary | ICD-10-CM | POA: Diagnosis not present

## 2020-07-25 DIAGNOSIS — M5116 Intervertebral disc disorders with radiculopathy, lumbar region: Secondary | ICD-10-CM | POA: Diagnosis not present

## 2020-07-25 DIAGNOSIS — M5416 Radiculopathy, lumbar region: Secondary | ICD-10-CM | POA: Diagnosis not present

## 2020-07-25 DIAGNOSIS — Z01818 Encounter for other preprocedural examination: Secondary | ICD-10-CM | POA: Diagnosis not present

## 2020-07-28 ENCOUNTER — Telehealth: Payer: Self-pay

## 2020-07-28 NOTE — Telephone Encounter (Signed)
Our office received medical clearance forms for patient to be cleared for upcomming surgery scheduled 08/16/2020. Per Dr. Caryn Section, patient was last seen by Laverna Peace on 07/11/2020 with uncontrolled blood pressure. Per Dr. Caryn Section patient needs office visit for pre op and blood pressure management. I tried calling patient. I left a detailed message on his cell phone voice message system (ok per DPR). [place] for [facility] triage to advise and schedule appointment with any provider in our office.   Surgical clearance paperwork will be on [facility] nurse desk for now.

## 2020-07-31 NOTE — Telephone Encounter (Signed)
Appointment has been scheduled- 08/02/20 for pre-op clearance

## 2020-08-02 ENCOUNTER — Encounter: Payer: Self-pay | Admitting: Family Medicine

## 2020-08-02 ENCOUNTER — Other Ambulatory Visit: Payer: Self-pay

## 2020-08-02 ENCOUNTER — Ambulatory Visit: Payer: BC Managed Care – PPO | Admitting: Family Medicine

## 2020-08-02 VITALS — BP 112/74 | HR 85 | Temp 98.1°F | Resp 16 | Wt 246.0 lb

## 2020-08-02 DIAGNOSIS — Z01818 Encounter for other preprocedural examination: Secondary | ICD-10-CM | POA: Diagnosis not present

## 2020-08-02 DIAGNOSIS — E7800 Pure hypercholesterolemia, unspecified: Secondary | ICD-10-CM

## 2020-08-02 DIAGNOSIS — I10 Essential (primary) hypertension: Secondary | ICD-10-CM

## 2020-08-02 DIAGNOSIS — M5137 Other intervertebral disc degeneration, lumbosacral region: Secondary | ICD-10-CM | POA: Diagnosis not present

## 2020-08-02 DIAGNOSIS — D352 Benign neoplasm of pituitary gland: Secondary | ICD-10-CM | POA: Diagnosis not present

## 2020-08-02 NOTE — Progress Notes (Signed)
I,Joel Terry,acting as a scribe for Joel Durie, MD.,have documented all relevant documentation on the behalf of Joel Durie, MD,as directed by  Joel Durie, MD while in the presence of Joel Durie, MD.   Established patient visit   Patient: Joel Terry   DOB: February 17, 1966   55 y.o. Male  MRN: [id number] Visit Date: 08/02/2020  Today's healthcare provider: [name], MD   Chief Complaint  Patient presents with  . Pre-op Exam   Subjective    HPI  Patient is here for surgical clearance. Surgery is scheduled for Laminotomy (Hemilaminect) on 08/07/2020. No syncope presyncope chest pain shortness of breath dyspnea on exertion or any neurologic symptoms.     Medications: Outpatient Medications Prior to Visit  Medication Sig  . albuterol (VENTOLIN HFA) 108 (90 Base) MCG/ACT inhaler Inhale 2 puffs into the lungs every 6 (six) hours as needed for wheezing or shortness of breath.  . dicyclomine (BENTYL) 10 MG capsule Take 1 capsule (10 mg total) by mouth 4 (four) times daily -  before meals and at bedtime.  Marland Kitchen EPINEPHrine 0.3 mg/0.3 mL IJ SOAJ injection Inject 0.3 mLs (0.3 mg total) into the muscle as needed for anaphylaxis.  . Eszopiclone 3 MG TABS Take 1 tablet (3 mg total) by mouth at bedtime. Take immediately before bedtime  . gabapentin (NEURONTIN) 300 MG capsule Take by mouth.  . hydrochlorothiazide (HYDRODIURIL) 25 MG tablet Take 1 tablet (25 mg total) by mouth daily.  Marland Kitchen lisinopril (ZESTRIL) 40 MG tablet Take 1 tablet (40 mg total) by mouth daily.  . simvastatin (ZOCOR) 20 MG tablet Take 1 tablet (20 mg total) by mouth daily at 6 PM.  . SUMAtriptan (IMITREX) 100 MG tablet Take 1 tablet (100 mg total) by mouth every 2 (two) hours as needed for migraine. May repeat in 2 hours if headache persists or recurs.  Marland Kitchen BREO ELLIPTA 200-25 MCG/INH AEPB INHALE 1 PUFF INTO THE LUNGS DAILY (Patient not taking: Reported on 07/11/2020)  . diclofenac (VOLTAREN)  75 MG EC tablet Take 75 mg by mouth 2 (two) times daily. (Patient not taking: No sig reported)  . methocarbamol (ROBAXIN) 500 MG tablet Take 1 tablet (500 mg total) by mouth every 8 (eight) hours as needed for muscle spasms.  . naproxen (NAPROSYN) 500 MG tablet Take 1 tablet (500 mg total) by mouth 2 (two) times daily with a meal.   No facility-administered medications prior to visit.    Review of Systems  Constitutional: Negative for appetite change, chills and fever.  Respiratory: Negative for chest tightness, shortness of breath and wheezing.   Cardiovascular: Negative for chest pain and palpitations.  Gastrointestinal: Negative for abdominal pain, nausea and vomiting.        Objective    BP 112/74 (BP Location: Right Arm, Patient Position: Sitting, Cuff Size: Large)   Pulse 85   Temp 98.1 F (36.7 C) (Oral)   Resp 16   Wt 246 lb (111.6 kg)   SpO2 95%   BMI 37.40 kg/m  BP Readings from Last 3 Encounters:  08/02/20 112/74  07/11/20 (!) 127/107  02/18/20 (!) 153/100   Wt Readings from Last 3 Encounters:  08/02/20 246 lb (111.6 kg)  07/11/20 243 lb 3.2 oz (110.3 kg)  04/03/20 235 lb (106.6 kg)       Physical Exam Vitals reviewed.  Constitutional:      General: He is not in acute distress.    Appearance: Normal appearance.  He is well-developed. He is not ill-appearing, toxic-appearing or diaphoretic.  HENT:     Head: Normocephalic and atraumatic.     Right Ear: Hearing, tympanic membrane, ear canal and external ear normal.     Left Ear: Hearing, tympanic membrane, ear canal and external ear normal.     Nose: Nose normal.     Mouth/Throat:     Pharynx: Uvula midline. No oropharyngeal exudate.  Eyes:     General: Lids are normal. No scleral icterus.       Right eye: No discharge.        Left eye: No discharge.     Conjunctiva/sclera: Conjunctivae normal.     Pupils: Pupils are equal, round, and reactive to light.  Neck:     Thyroid: No thyromegaly.     Vascular:  Normal carotid pulses. No carotid bruit, hepatojugular reflux or JVD.     Trachea: Trachea and phonation normal. No tracheal tenderness or tracheal deviation.     Meningeal: Brudzinski's sign absent.  Cardiovascular:     Rate and Rhythm: Normal rate and regular rhythm.     Pulses: Normal pulses.     Heart sounds: Normal heart sounds, S1 normal and S2 normal. Heart sounds not distant. No murmur heard. No friction rub. No gallop.   Pulmonary:     Effort: Pulmonary effort is normal. No accessory muscle usage or respiratory distress.     Breath sounds: Normal breath sounds. No stridor. No wheezing or rales.  Chest:     Chest wall: No tenderness.  Abdominal:     General: Bowel sounds are normal. There is no distension.     Palpations: Abdomen is soft. There is no mass.     Tenderness: There is no abdominal tenderness. There is no guarding or rebound.  Musculoskeletal:     Cervical back: Full passive range of motion without pain, normal range of motion and neck supple.     Right lower leg: No edema.     Left lower leg: No edema.  Lymphadenopathy:     Head:     Right side of head: No submental, submandibular, tonsillar, preauricular, posterior auricular or occipital adenopathy.     Left side of head: No submental, submandibular, tonsillar, preauricular, posterior auricular or occipital adenopathy.     Cervical: No cervical adenopathy.  Skin:    General: Skin is warm and dry.     Coloration: Skin is not pale.     Findings: No erythema or rash.     Nails: There is no clubbing.  Neurological:     General: No focal deficit present.     Mental Status: He is alert and oriented to person, place, and time.     GCS: GCS eye subscore is 4. GCS verbal subscore is 5. GCS motor subscore is 6.     Cranial Nerves: No cranial nerve deficit.     Sensory: No sensory deficit.     Motor: No abnormal muscle tone.     Coordination: Coordination normal.     Deep Tendon Reflexes: Reflexes are normal and  symmetric. Reflexes normal.  Psychiatric:        Mood and Affect: Mood normal.        Speech: Speech normal.        Behavior: Behavior normal.        Thought Content: Thought content normal.        Judgment: Judgment normal.     ECG reveals normal sinus rhythm rate of  about 80 with no ischemic changes.  No results found for any visits on 08/02/20.  Assessment & Plan     1. Preoperative clearance Patient cleared for surgery - EKG 12-Lead  2. Degenerative disc disease at L5-S1 level Patient for laminectomy  3. Benign essential HTN Good control  4. 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 Pure hypercholesterolemia    No follow-ups on file.      I, Joel Durie, MD, have reviewed all documentation for this visit. The documentation on 08/08/20 for the exam, diagnosis, procedures, and orders are all accurate and complete.    Neshawn Aird Cranford Mon, MD  [facility] [phone] (phone) [phone] (fax)  [place]

## 2020-08-04 DIAGNOSIS — M5416 Radiculopathy, lumbar region: Secondary | ICD-10-CM | POA: Insufficient documentation

## 2020-08-07 DIAGNOSIS — E669 Obesity, unspecified: Secondary | ICD-10-CM | POA: Diagnosis not present

## 2020-08-07 DIAGNOSIS — Z87442 Personal history of urinary calculi: Secondary | ICD-10-CM | POA: Diagnosis not present

## 2020-08-07 DIAGNOSIS — M5116 Intervertebral disc disorders with radiculopathy, lumbar region: Secondary | ICD-10-CM | POA: Diagnosis not present

## 2020-08-07 DIAGNOSIS — Z882 Allergy status to sulfonamides status: Secondary | ICD-10-CM | POA: Diagnosis not present

## 2020-08-07 DIAGNOSIS — J45909 Unspecified asthma, uncomplicated: Secondary | ICD-10-CM | POA: Diagnosis not present

## 2020-08-07 DIAGNOSIS — D352 Benign neoplasm of pituitary gland: Secondary | ICD-10-CM | POA: Diagnosis not present

## 2020-08-07 DIAGNOSIS — I129 Hypertensive chronic kidney disease with stage 1 through stage 4 chronic kidney disease, or unspecified chronic kidney disease: Secondary | ICD-10-CM | POA: Diagnosis not present

## 2020-08-07 DIAGNOSIS — M5127 Other intervertebral disc displacement, lumbosacral region: Secondary | ICD-10-CM | POA: Diagnosis not present

## 2020-08-07 DIAGNOSIS — E785 Hyperlipidemia, unspecified: Secondary | ICD-10-CM | POA: Diagnosis not present

## 2020-08-07 DIAGNOSIS — N189 Chronic kidney disease, unspecified: Secondary | ICD-10-CM | POA: Diagnosis not present

## 2020-08-07 DIAGNOSIS — Z6837 Body mass index (BMI) 37.0-37.9, adult: Secondary | ICD-10-CM | POA: Diagnosis not present

## 2020-08-07 DIAGNOSIS — R944 Abnormal results of kidney function studies: Secondary | ICD-10-CM | POA: Diagnosis not present

## 2020-08-07 DIAGNOSIS — I10 Essential (primary) hypertension: Secondary | ICD-10-CM | POA: Diagnosis not present

## 2020-08-07 DIAGNOSIS — N401 Enlarged prostate with lower urinary tract symptoms: Secondary | ICD-10-CM | POA: Diagnosis not present

## 2020-08-07 DIAGNOSIS — G8918 Other acute postprocedural pain: Secondary | ICD-10-CM | POA: Diagnosis not present

## 2020-08-07 DIAGNOSIS — N138 Other obstructive and reflux uropathy: Secondary | ICD-10-CM | POA: Diagnosis not present

## 2020-08-07 DIAGNOSIS — R339 Retention of urine, unspecified: Secondary | ICD-10-CM | POA: Diagnosis not present

## 2020-08-07 DIAGNOSIS — N139 Obstructive and reflux uropathy, unspecified: Secondary | ICD-10-CM | POA: Diagnosis not present

## 2020-08-08 DIAGNOSIS — E785 Hyperlipidemia, unspecified: Secondary | ICD-10-CM | POA: Diagnosis not present

## 2020-08-08 DIAGNOSIS — Z6837 Body mass index (BMI) 37.0-37.9, adult: Secondary | ICD-10-CM | POA: Diagnosis not present

## 2020-08-08 DIAGNOSIS — E669 Obesity, unspecified: Secondary | ICD-10-CM | POA: Diagnosis not present

## 2020-08-08 DIAGNOSIS — N401 Enlarged prostate with lower urinary tract symptoms: Secondary | ICD-10-CM | POA: Diagnosis not present

## 2020-08-08 DIAGNOSIS — Z882 Allergy status to sulfonamides status: Secondary | ICD-10-CM | POA: Diagnosis not present

## 2020-08-08 DIAGNOSIS — I129 Hypertensive chronic kidney disease with stage 1 through stage 4 chronic kidney disease, or unspecified chronic kidney disease: Secondary | ICD-10-CM | POA: Diagnosis not present

## 2020-08-08 DIAGNOSIS — G8918 Other acute postprocedural pain: Secondary | ICD-10-CM | POA: Diagnosis not present

## 2020-08-08 DIAGNOSIS — N139 Obstructive and reflux uropathy, unspecified: Secondary | ICD-10-CM | POA: Diagnosis not present

## 2020-08-08 DIAGNOSIS — M47816 Spondylosis without myelopathy or radiculopathy, lumbar region: Secondary | ICD-10-CM | POA: Diagnosis not present

## 2020-08-08 DIAGNOSIS — I10 Essential (primary) hypertension: Secondary | ICD-10-CM | POA: Diagnosis not present

## 2020-08-08 DIAGNOSIS — N138 Other obstructive and reflux uropathy: Secondary | ICD-10-CM | POA: Diagnosis not present

## 2020-08-08 DIAGNOSIS — N179 Acute kidney failure, unspecified: Secondary | ICD-10-CM | POA: Diagnosis not present

## 2020-08-08 DIAGNOSIS — M5136 Other intervertebral disc degeneration, lumbar region: Secondary | ICD-10-CM | POA: Diagnosis not present

## 2020-08-08 DIAGNOSIS — M5127 Other intervertebral disc displacement, lumbosacral region: Secondary | ICD-10-CM | POA: Diagnosis not present

## 2020-08-08 DIAGNOSIS — N189 Chronic kidney disease, unspecified: Secondary | ICD-10-CM | POA: Diagnosis not present

## 2020-08-08 DIAGNOSIS — J45909 Unspecified asthma, uncomplicated: Secondary | ICD-10-CM | POA: Diagnosis not present

## 2020-08-08 DIAGNOSIS — Z87442 Personal history of urinary calculi: Secondary | ICD-10-CM | POA: Diagnosis not present

## 2020-08-08 DIAGNOSIS — R339 Retention of urine, unspecified: Secondary | ICD-10-CM | POA: Diagnosis not present

## 2020-08-08 DIAGNOSIS — D352 Benign neoplasm of pituitary gland: Secondary | ICD-10-CM | POA: Diagnosis not present

## 2020-08-08 DIAGNOSIS — M5116 Intervertebral disc disorders with radiculopathy, lumbar region: Secondary | ICD-10-CM | POA: Diagnosis not present

## 2020-08-17 DIAGNOSIS — M545 Low back pain, unspecified: Secondary | ICD-10-CM | POA: Diagnosis not present

## 2020-08-17 DIAGNOSIS — J9811 Atelectasis: Secondary | ICD-10-CM | POA: Diagnosis not present

## 2020-08-17 DIAGNOSIS — M5416 Radiculopathy, lumbar region: Secondary | ICD-10-CM | POA: Diagnosis not present

## 2020-08-17 DIAGNOSIS — Z882 Allergy status to sulfonamides status: Secondary | ICD-10-CM | POA: Diagnosis not present

## 2020-08-17 DIAGNOSIS — Z7901 Long term (current) use of anticoagulants: Secondary | ICD-10-CM | POA: Diagnosis not present

## 2020-08-17 DIAGNOSIS — S3993XD Unspecified injury of pelvis, subsequent encounter: Secondary | ICD-10-CM | POA: Diagnosis not present

## 2020-08-17 DIAGNOSIS — E785 Hyperlipidemia, unspecified: Secondary | ICD-10-CM | POA: Diagnosis not present

## 2020-08-17 DIAGNOSIS — S3991XD Unspecified injury of abdomen, subsequent encounter: Secondary | ICD-10-CM | POA: Diagnosis not present

## 2020-08-17 DIAGNOSIS — M549 Dorsalgia, unspecified: Secondary | ICD-10-CM | POA: Diagnosis not present

## 2020-08-17 DIAGNOSIS — Z825 Family history of asthma and other chronic lower respiratory diseases: Secondary | ICD-10-CM | POA: Diagnosis not present

## 2020-08-17 DIAGNOSIS — M546 Pain in thoracic spine: Secondary | ICD-10-CM | POA: Diagnosis not present

## 2020-08-17 DIAGNOSIS — R0602 Shortness of breath: Secondary | ICD-10-CM | POA: Diagnosis not present

## 2020-08-17 DIAGNOSIS — Z8249 Family history of ischemic heart disease and other diseases of the circulatory system: Secondary | ICD-10-CM | POA: Diagnosis not present

## 2020-08-17 DIAGNOSIS — Z79899 Other long term (current) drug therapy: Secondary | ICD-10-CM | POA: Diagnosis not present

## 2020-08-17 DIAGNOSIS — I10 Essential (primary) hypertension: Secondary | ICD-10-CM | POA: Diagnosis not present

## 2020-08-17 DIAGNOSIS — I2699 Other pulmonary embolism without acute cor pulmonale: Secondary | ICD-10-CM | POA: Diagnosis not present

## 2020-08-17 DIAGNOSIS — R0689 Other abnormalities of breathing: Secondary | ICD-10-CM | POA: Diagnosis not present

## 2020-08-17 DIAGNOSIS — Z87442 Personal history of urinary calculi: Secondary | ICD-10-CM | POA: Diagnosis not present

## 2020-08-17 DIAGNOSIS — J45909 Unspecified asthma, uncomplicated: Secondary | ICD-10-CM | POA: Diagnosis not present

## 2020-08-17 DIAGNOSIS — R0902 Hypoxemia: Secondary | ICD-10-CM | POA: Diagnosis not present

## 2020-08-17 DIAGNOSIS — R319 Hematuria, unspecified: Secondary | ICD-10-CM | POA: Diagnosis not present

## 2020-08-17 DIAGNOSIS — I959 Hypotension, unspecified: Secondary | ICD-10-CM | POA: Diagnosis not present

## 2020-08-18 DIAGNOSIS — R319 Hematuria, unspecified: Secondary | ICD-10-CM | POA: Diagnosis not present

## 2020-08-18 DIAGNOSIS — Z7901 Long term (current) use of anticoagulants: Secondary | ICD-10-CM | POA: Diagnosis not present

## 2020-08-18 DIAGNOSIS — M545 Low back pain, unspecified: Secondary | ICD-10-CM | POA: Diagnosis not present

## 2020-08-18 DIAGNOSIS — M549 Dorsalgia, unspecified: Secondary | ICD-10-CM | POA: Diagnosis not present

## 2020-08-18 DIAGNOSIS — J9811 Atelectasis: Secondary | ICD-10-CM | POA: Diagnosis not present

## 2020-08-18 DIAGNOSIS — E785 Hyperlipidemia, unspecified: Secondary | ICD-10-CM | POA: Diagnosis not present

## 2020-08-18 DIAGNOSIS — Z882 Allergy status to sulfonamides status: Secondary | ICD-10-CM | POA: Diagnosis not present

## 2020-08-18 DIAGNOSIS — S3991XD Unspecified injury of abdomen, subsequent encounter: Secondary | ICD-10-CM | POA: Diagnosis not present

## 2020-08-18 DIAGNOSIS — M5416 Radiculopathy, lumbar region: Secondary | ICD-10-CM | POA: Diagnosis not present

## 2020-08-18 DIAGNOSIS — M546 Pain in thoracic spine: Secondary | ICD-10-CM | POA: Diagnosis not present

## 2020-08-18 DIAGNOSIS — Z79899 Other long term (current) drug therapy: Secondary | ICD-10-CM | POA: Diagnosis not present

## 2020-08-18 DIAGNOSIS — I2699 Other pulmonary embolism without acute cor pulmonale: Secondary | ICD-10-CM | POA: Diagnosis not present

## 2020-08-18 DIAGNOSIS — Z825 Family history of asthma and other chronic lower respiratory diseases: Secondary | ICD-10-CM | POA: Diagnosis not present

## 2020-08-18 DIAGNOSIS — R0602 Shortness of breath: Secondary | ICD-10-CM | POA: Diagnosis not present

## 2020-08-18 DIAGNOSIS — S3993XD Unspecified injury of pelvis, subsequent encounter: Secondary | ICD-10-CM | POA: Diagnosis not present

## 2020-08-18 DIAGNOSIS — I10 Essential (primary) hypertension: Secondary | ICD-10-CM | POA: Diagnosis not present

## 2020-08-18 DIAGNOSIS — M5459 Other low back pain: Secondary | ICD-10-CM | POA: Diagnosis not present

## 2020-08-18 DIAGNOSIS — J45909 Unspecified asthma, uncomplicated: Secondary | ICD-10-CM | POA: Diagnosis not present

## 2020-08-18 DIAGNOSIS — Z8249 Family history of ischemic heart disease and other diseases of the circulatory system: Secondary | ICD-10-CM | POA: Diagnosis not present

## 2020-08-24 ENCOUNTER — Ambulatory Visit: Payer: BC Managed Care – PPO | Admitting: Family Medicine

## 2020-08-24 ENCOUNTER — Other Ambulatory Visit: Payer: Self-pay

## 2020-08-24 ENCOUNTER — Encounter: Payer: Self-pay | Admitting: Family Medicine

## 2020-08-24 VITALS — BP 110/87 | HR 109 | Temp 97.5°F | Resp 16 | Ht 68.0 in | Wt 236.7 lb

## 2020-08-24 DIAGNOSIS — I2693 Single subsegmental pulmonary embolism without acute cor pulmonale: Secondary | ICD-10-CM | POA: Diagnosis not present

## 2020-08-24 HISTORY — DX: Single subsegmental thrombotic pulmonary embolism without acute cor pulmonale: I26.93

## 2020-08-24 MED ORDER — ELIQUIS 5 MG PO TABS: 5.0000 mg | ORAL_TABLET | Freq: Two times a day (BID) | ORAL | 4 refills | Status: DC

## 2020-08-24 NOTE — Patient Instructions (Signed)
Pulmonary Embolism  A pulmonary embolism (PE) is a sudden blockage or decrease of blood flow in one or both lungs that happens when a clot travels into the arteries of the lung (pulmonary arteries). Most blockages come from a blood clot that forms in the vein of a leg or arm (deep vein thrombosis, DVT) and travels to the lungs. A clot is blood that has thickened into a gel or solid. PE is a dangerous and life-threatening condition that needs to be treated right away. What are the causes? This condition is usually caused by a blood clot that forms in a vein and moves to the lungs. In rare cases, it may be caused by air, fat, part of a tumor, or other tissue that moves through the veins and into the lungs. What increases the risk? The following factors may make you more likely to develop this condition:  Experiencing a traumatic injury, such as breaking a hip or leg.  Having: ? A spinal cord injury. ? Major surgery, especially hip or knee replacement, or surgery on parts of the nervous system or on the abdomen. ? A stroke. ? A blood clotting disease. ? Long-term (chronic) lung or heart disease. ? Cancer, especially if you are being treated with chemotherapy. ? A central venous catheter.  Taking medicines that contain estrogen. These include birth control pills and hormone replacement therapy.  Being: ? Pregnant. ? In the period of time after your baby is delivered (postpartum). ? Older than age 18. ? Overweight. ? A smoker, especially if you have other risks. ? Not very active (sedentary), not being able to move at all, or spending long periods sitting, such as travel over 6 hours. You are also at a greater risk if you have a leg in a cast or splint. What are the signs or symptoms? Symptoms of this condition usually start suddenly and include:  Shortness of breath during activity or at rest.  Coughing, coughing up blood, or coughing up bloody mucus.  Chest pain, back pain, or  shoulder blade pain that gets worse with deep breaths.  Rapid or irregular heartbeat.  Feeling light-headed or dizzy, or fainting.  Feeling anxious.  Pain and swelling in a leg. This is a symptom of DVT, which can lead to PE. How is this diagnosed? This condition may be diagnosed based on your medical history, a physical exam, and tests. Tests may include:  Blood tests.  An ECG (electrocardiogram) of the heart.  A CT pulmonary angiogram. This test checks blood flow in and around your lungs.  A ventilation-perfusion scan, also called a lung VQ scan. This test measures air flow and blood flow to the lungs.  An ultrasound to check for a DVT. How is this treated? Treatment for this condition depends on many factors, such as the cause of your PE, your risk for bleeding or developing more clots, and other medical conditions you may have. Treatment aims to stop blood clots from forming or growing larger. In some cases, treatment may be aimed at breaking apart or removing the blood clot. Treatment may include:  Medicines, such as: ? Blood thinning medicines, also called anticoagulants, to stop clots from forming and growing. ? Medicines that break apart clots (thrombolytics).  Procedures, such as: ? Using a flexible tube to remove a blood clot (embolectomy) or to deliver medicine to destroy it (catheter-directed thrombolysis). ? Surgery to remove the clot (surgical embolectomy). This is rare. You may need a combination of immediate, long-term, and extended  treatments. Your treatment may continue for several months (maintenance therapy) or longer depending on your medical conditions. You and your health care provider will work together to choose the treatment program that is best for you. Follow these instructions at home: Medicines  Take over-the-counter and prescription medicines only as told by your health care provider.  If you are taking blood thinners: ? Talk with your health care  provider before you take any medicines that contain aspirin or NSAIDs, such as ibuprofen. These medicines increase your risk for dangerous bleeding. ? Take your medicine exactly as told, at the same time every day. ? Avoid activities that could cause injury or bruising, and follow instructions about how to prevent falls. ? Wear a medical alert bracelet or carry a card that lists what medicines you take.  Understand what foods and drugs interact with any medicines that you are taking. General instructions  Ask your health care provider when you may return to your normal activities. Avoid sitting or lying for a long time without moving.  Maintain a healthy weight. Ask your health care provider what weight is healthy for you.  Do not use any products that contain nicotine or tobacco, such as cigarettes, e-cigarettes, and chewing tobacco. If you need help quitting, ask your health care provider.  Talk with your health care provider about any travel plans. It is important to make sure that you are still able to take your medicine while traveling.  Keep all follow-up visits as told by your health care provider. This is important. Where to find more information  American Lung Association: www.lung.org  Centers for Disease Control and Prevention: http://www.wolf.info/ Contact a health care provider if:  You missed a dose of your blood thinner medicine. Get help right away if you:  Have: ? New or increased pain, swelling, warmth, or redness in an arm or leg. ? Shortness of breath that gets worse during activity or at rest. ? A fever. ? Worsening chest pain. ? A rapid or irregular heartbeat. ? A severe headache. ? Vision changes. ? A serious fall or accident, or you hit your head. ? Stomach pain. ? Blood in your vomit, stool, or urine. ? A cut that will not stop bleeding.  Cough up blood.  Feel light-headed or dizzy, and that feeling does not go away.  Cannot move your arms or legs.  Are  confused or have memory loss. These symptoms may represent a serious problem that is an emergency. Do not wait to see if the symptoms will go away. Get medical help right away. Call your local emergency services (911 in the [place] Do not drive yourself to the hospital. Summary  A pulmonary embolism (PE) is a serious and potentially life-threatening condition, in which a blood clot from one part of the body (deep vein thrombosis, DVT) travels to the arteries of the lung, causing a sudden blockage or decrease of blood flow to the lungs. This may result in shortness of breath, chest pain, dizziness, and fainting.  Treatments for this condition usually include medicines to thin your blood (anticoagulants) or medicines to break apart blood clots (thrombolytics).  If you are given blood thinners, take your medicine exactly as told by your health care provider, at the same time every day. This is important.  Understand what foods and drugs interact with any medicines that you are taking.  If you have signs of PE or DVT, call your local emergency services (911 in the [place] This information is not  intended to replace advice given to you by your health care provider. Make sure you discuss any questions you have with your health care provider. Document Revised: 02/12/2019 Document Reviewed: 02/12/2019 Elsevier Patient Education  2021 [employer].

## 2020-08-24 NOTE — Assessment & Plan Note (Addendum)
Reviewed ER notes and imaging from [facility] chart On appropriate dose of eliquis, which we will continue PE may have been provoked in the setting of recent surgery, but it was a very small procedure and he was not immobilized for long periods of time Offered Hem/Onc, but suspect this is provoked and he declines at this time Will continue anticoagulation for total of 6-12 months Will f/u in 6 months to reassess

## 2020-08-24 NOTE — Progress Notes (Signed)
Established patient visit   Patient: Joel Terry   DOB: 19-Jan-1966   55 y.o. Male  MRN: [id number] Visit Date: 08/24/2020  Today's healthcare provider: Lavon Paganini, MD   Chief Complaint  Patient presents with  . Follow-up   Subjective    HPI   PE He said he feels like he was shuffled around post surgery on his back. He received a foley catheterization that was prematurely inflated and cause hemorrhaging  He said his surgeon wasn't aware of the bleeding until post-surgery. Shortly after he had a PE and he believes it is associated with surgery. He was recommended to take blood thinner for 3 months after being discharged from the ER. He has experienced increased heart rate that may be associated with his PE.   He doesn't have any history of blood clots, immobilization, nor cancer. He denies wanting to receive a full clot work-up from hematology. He is tolerating 2 pills daily PO of Eliquise to prevent further blood clots. He questions if he needs to limit activity. He only experiences SOB when laying down for extended periods of time.   Follow up ER visit  Patient was seen in ER for shortness of breath on 08/18/20. He was treated for small pulmonary embolism. Treatment for this included Eliquis 10 mg BID then 5 mg BID starting 08/25/2020. He reports excellent compliance with treatment. He reports this condition is Improved.  -----------------------------------------------------------------------------------------  Patient Active Problem List   Diagnosis Date Noted  . Single subsegmental pulmonary embolism without acute cor pulmonale ([place]) 08/24/2020  . Recurrent major depressive disorder, in partial remission ([place]) 08/12/2019  . Asthma 06/10/2018  . Incomplete emptying of bladder 12/01/2015  . 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  . H/O renal calculi 04/26/2015  . Benign prostatic hyperplasia with urinary obstruction 04/26/2015  . Migraines  01/13/2015  . Ulcerative colitis ([place]) 01/13/2015  . 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 Allergic rhinitis 12/16/2013  . Benign essential HTN 12/16/2013  . HLD (hyperlipidemia) 12/16/2013   Social History   Tobacco Use  . Smoking status: Never Smoker  . Smokeless tobacco: Never Used  Vaping Use  . Vaping Use: Never used  Substance Use Topics  . Alcohol use: Yes    Alcohol/week: 3.0 standard drinks    Types: 3 Cans of beer per week    Comment: Occassional  . Drug use: No   Allergies  Allergen Reactions  . Sulfa Antibiotics Hives    Increased HR and BP  . Elemental Sulfur Rash and Other (See Comments)    Elevated BP and heart rate       Medications: Outpatient Medications Prior to Visit  Medication Sig  . albuterol (VENTOLIN HFA) 108 (90 Base) MCG/ACT inhaler Inhale 2 puffs into the lungs every 6 (six) hours as needed for wheezing or shortness of breath.  . diazepam (VALIUM) 5 MG tablet Take 5 mg by mouth every 8 (eight) hours as needed for anxiety.  Marland Kitchen EPINEPHrine 0.3 mg/0.3 mL IJ SOAJ injection Inject 0.3 mLs (0.3 mg total) into the muscle as needed for anaphylaxis.  . hydrochlorothiazide (HYDRODIURIL) 25 MG tablet Take 1 tablet (25 mg total) by mouth daily.  Marland Kitchen lisinopril (ZESTRIL) 40 MG tablet Take 1 tablet (40 mg total) by mouth daily.  . simvastatin (ZOCOR) 20 MG tablet Take 1 tablet (20 mg total) by mouth daily at 6 PM.  . SUMAtriptan (IMITREX) 100 MG tablet Take 1 tablet (100 mg total) by mouth  every 2 (two) hours as needed for migraine. May repeat in 2 hours if headache persists or recurs.  . [DISCONTINUED] apixaban (ELIQUIS) 5 MG TABS tablet Take 5 mg by mouth 2 (two) times daily.  . [DISCONTINUED] BREO ELLIPTA 200-25 MCG/INH AEPB INHALE 1 PUFF INTO THE LUNGS DAILY (Patient not taking: Reported on 07/11/2020)  . [DISCONTINUED] diclofenac (VOLTAREN) 75 MG EC tablet Take 75 mg by mouth 2 (two) times daily. (Patient not taking: No sig reported)  . [DISCONTINUED]  dicyclomine (BENTYL) 10 MG capsule Take 1 capsule (10 mg total) by mouth 4 (four) times daily -  before meals and at bedtime. (Patient not taking: Reported on 08/24/2020)  . [DISCONTINUED] Eszopiclone 3 MG TABS Take 1 tablet (3 mg total) by mouth at bedtime. Take immediately before bedtime (Patient not taking: Reported on 08/24/2020)  . [DISCONTINUED] gabapentin (NEURONTIN) 300 MG capsule Take by mouth. (Patient not taking: Reported on 08/24/2020)  . [DISCONTINUED] methocarbamol (ROBAXIN) 500 MG tablet Take 1 tablet (500 mg total) by mouth every 8 (eight) hours as needed for muscle spasms.  . [DISCONTINUED] naproxen (NAPROSYN) 500 MG tablet Take 1 tablet (500 mg total) by mouth 2 (two) times daily with a meal.   No facility-administered medications prior to visit.    Review of Systems  Constitutional: Negative for appetite change, chills, fatigue and fever.  HENT: Negative for ear pain, mouth sores, nosebleeds, sinus pressure, sinus pain and sore throat.   Eyes: Negative for pain.  Respiratory: Negative for cough, chest tightness, shortness of breath and wheezing.   Cardiovascular: Negative for chest pain, palpitations and leg swelling.  Gastrointestinal: Negative for abdominal pain, blood in stool, diarrhea, nausea and vomiting.  Genitourinary: Negative for dysuria, flank pain, frequency and urgency.  Musculoskeletal: Positive for back pain. Negative for neck pain and neck stiffness.  Neurological: Negative for dizziness, syncope, weakness, light-headedness, numbness and headaches.        Objective    BP 110/87 (BP Location: Right Arm, Patient Position: Sitting, Cuff Size: Large)   Pulse (!) 109   Temp (!) 97.5 F (36.4 C) (Oral)   Resp 16   Ht 5' 8"  (1.727 m)   Wt 236 lb 11.2 oz (107.4 kg)   SpO2 97%   BMI 35.99 kg/m  BP Readings from Last 3 Encounters:  08/24/20 110/87  08/02/20 112/74  07/11/20 (!) 127/107   Wt Readings from Last 3 Encounters:  08/24/20 236 lb 11.2 oz (107.4  kg)  08/02/20 246 lb (111.6 kg)  07/11/20 243 lb 3.2 oz (110.3 kg)      Physical Exam Vitals reviewed.  Constitutional:      General: He is not in acute distress.    Appearance: Normal appearance. He is not diaphoretic.  HENT:     Head: Normocephalic and atraumatic.  Eyes:     General: No scleral icterus.    Conjunctiva/sclera: Conjunctivae normal.  Cardiovascular:     Rate and Rhythm: Regular rhythm. Tachycardia present.     Pulses: Normal pulses.     Heart sounds: Normal heart sounds. No murmur heard.   Pulmonary:     Effort: Pulmonary effort is normal. No respiratory distress.     Breath sounds: Normal breath sounds. No wheezing or rhonchi.  Abdominal:     General: There is no distension.     Palpations: Abdomen is soft.     Tenderness: There is no abdominal tenderness.  Musculoskeletal:     Cervical back: Neck supple.     Right  lower leg: No edema.     Left lower leg: No edema.  Lymphadenopathy:     Cervical: No cervical adenopathy.  Skin:    General: Skin is warm and dry.     Capillary Refill: Capillary refill takes less than 2 seconds.     Findings: No rash.  Neurological:     Mental Status: He is alert and oriented to person, place, and time.     Cranial Nerves: No cranial nerve deficit.  Psychiatric:        Mood and Affect: Mood normal.        Behavior: Behavior normal.       No results found for any visits on 08/24/20.  Assessment & Plan     Problem List Items Addressed This Visit      Cardiovascular and Mediastinum   Single subsegmental pulmonary embolism without acute cor pulmonale ([facility]) - Primary    Reviewed ER notes and imaging from [facility] chart On appropriate dose of eliquis, which we will continue PE may have been provoked in the setting of recent surgery, but it was a very small procedure and he was not immobilized for long periods of time Offered Hem/Onc, but suspect this is provoked and he declines at this time Will continue anticoagulation  for total of 6-12 months Will f/u in 6 months to reassess      Relevant Medications   apixaban (ELIQUIS) 5 MG TABS tablet       Return in about 6 months (around 02/24/2021) for chronic disease f/u.      Total time spent on today's visit was greater than 30 minutes, including both face-to-face time and nonface-to-face time personally spent on review of chart (labs and imaging), discussing labs and goals, discussing further work-up, treatment options, referrals to specialist if needed, reviewing outside records of pertinent, answering patient's questions, and coordinating care.    I,Essence Turner,acting as a Education administrator for Lavon Paganini, MD.,have documented all relevant documentation on the behalf of Lavon Paganini, MD,as directed by  Lavon Paganini, MD while in the presence of Lavon Paganini, MD.  I, Lavon Paganini, MD, have reviewed all documentation for this visit. The documentation on 08/24/20 for the exam, diagnosis, procedures, and orders are all accurate and complete.   Kevona Lupinacci, Dionne Bucy, MD, MPH [facility]

## 2020-09-14 ENCOUNTER — Encounter: Payer: Self-pay | Admitting: Family Medicine

## 2020-09-21 DIAGNOSIS — M5117 Intervertebral disc disorders with radiculopathy, lumbosacral region: Secondary | ICD-10-CM | POA: Diagnosis not present

## 2020-09-21 DIAGNOSIS — M79606 Pain in leg, unspecified: Secondary | ICD-10-CM | POA: Diagnosis not present

## 2020-09-21 DIAGNOSIS — R079 Chest pain, unspecified: Secondary | ICD-10-CM | POA: Diagnosis not present

## 2020-09-21 DIAGNOSIS — M5416 Radiculopathy, lumbar region: Secondary | ICD-10-CM | POA: Diagnosis not present

## 2020-10-04 ENCOUNTER — Other Ambulatory Visit: Payer: Self-pay

## 2020-10-04 ENCOUNTER — Inpatient Hospital Stay
Admission: EM | Admit: 2020-10-04 | Discharge: 2020-10-06 | DRG: 683 | Disposition: A | Discharge status: Home / Self Care | Payer: BC Managed Care – PPO | Attending: Internal Medicine | Admitting: Internal Medicine

## 2020-10-04 ENCOUNTER — Emergency Department: Payer: BC Managed Care – PPO

## 2020-10-04 ENCOUNTER — Ambulatory Visit: Payer: Self-pay | Admitting: *Deleted

## 2020-10-04 DIAGNOSIS — I10 Essential (primary) hypertension: Secondary | ICD-10-CM | POA: Diagnosis not present

## 2020-10-04 DIAGNOSIS — R1110 Vomiting, unspecified: Secondary | ICD-10-CM | POA: Diagnosis not present

## 2020-10-04 DIAGNOSIS — E872 Acidosis: Secondary | ICD-10-CM | POA: Diagnosis present

## 2020-10-04 DIAGNOSIS — K6389 Other specified diseases of intestine: Secondary | ICD-10-CM | POA: Diagnosis not present

## 2020-10-04 DIAGNOSIS — R112 Nausea with vomiting, unspecified: Secondary | ICD-10-CM | POA: Diagnosis not present

## 2020-10-04 DIAGNOSIS — I2693 Single subsegmental pulmonary embolism without acute cor pulmonale: Secondary | ICD-10-CM | POA: Diagnosis not present

## 2020-10-04 DIAGNOSIS — D72829 Elevated white blood cell count, unspecified: Secondary | ICD-10-CM | POA: Diagnosis not present

## 2020-10-04 DIAGNOSIS — Z20822 Contact with and (suspected) exposure to covid-19: Secondary | ICD-10-CM | POA: Diagnosis present

## 2020-10-04 DIAGNOSIS — R197 Diarrhea, unspecified: Secondary | ICD-10-CM

## 2020-10-04 DIAGNOSIS — Z86711 Personal history of pulmonary embolism: Secondary | ICD-10-CM

## 2020-10-04 DIAGNOSIS — Z87442 Personal history of urinary calculi: Secondary | ICD-10-CM | POA: Diagnosis present

## 2020-10-04 DIAGNOSIS — E860 Dehydration: Secondary | ICD-10-CM | POA: Diagnosis not present

## 2020-10-04 DIAGNOSIS — Z79899 Other long term (current) drug therapy: Secondary | ICD-10-CM

## 2020-10-04 DIAGNOSIS — K5190 Ulcerative colitis, unspecified, without complications: Secondary | ICD-10-CM | POA: Diagnosis present

## 2020-10-04 DIAGNOSIS — R1111 Vomiting without nausea: Secondary | ICD-10-CM | POA: Diagnosis not present

## 2020-10-04 DIAGNOSIS — E669 Obesity, unspecified: Secondary | ICD-10-CM | POA: Diagnosis present

## 2020-10-04 DIAGNOSIS — Z882 Allergy status to sulfonamides status: Secondary | ICD-10-CM

## 2020-10-04 DIAGNOSIS — Z7901 Long term (current) use of anticoagulants: Secondary | ICD-10-CM | POA: Diagnosis not present

## 2020-10-04 DIAGNOSIS — E785 Hyperlipidemia, unspecified: Secondary | ICD-10-CM | POA: Diagnosis present

## 2020-10-04 DIAGNOSIS — N179 Acute kidney failure, unspecified: Secondary | ICD-10-CM | POA: Diagnosis not present

## 2020-10-04 DIAGNOSIS — R000 Tachycardia, unspecified: Secondary | ICD-10-CM | POA: Diagnosis not present

## 2020-10-04 DIAGNOSIS — Z6835 Body mass index (BMI) 35.0-35.9, adult: Secondary | ICD-10-CM | POA: Diagnosis not present

## 2020-10-04 DIAGNOSIS — I959 Hypotension, unspecified: Secondary | ICD-10-CM

## 2020-10-04 DIAGNOSIS — E871 Hypo-osmolality and hyponatremia: Secondary | ICD-10-CM | POA: Diagnosis not present

## 2020-10-04 DIAGNOSIS — N200 Calculus of kidney: Secondary | ICD-10-CM | POA: Diagnosis not present

## 2020-10-04 DIAGNOSIS — R55 Syncope and collapse: Secondary | ICD-10-CM | POA: Diagnosis not present

## 2020-10-04 DIAGNOSIS — Z9049 Acquired absence of other specified parts of digestive tract: Secondary | ICD-10-CM

## 2020-10-04 LAB — CBC WITH DIFFERENTIAL/PLATELET
Abs Immature Granulocytes: 0.03 10*3/uL (ref 0.00–0.07)
Basophils Absolute: 0 10*3/uL (ref 0.0–0.1)
Basophils Relative: 0 %
Eosinophils Absolute: 0 10*3/uL (ref 0.0–0.5)
Eosinophils Relative: 0 %
HCT: 46.3 % (ref 39.0–52.0)
Hemoglobin: 16.4 g/dL (ref 13.0–17.0)
Immature Granulocytes: 0 %
Lymphocytes Relative: 14 %
Lymphs Abs: 1.6 10*3/uL (ref 0.7–4.0)
MCH: 30.9 pg (ref 26.0–34.0)
MCHC: 35.4 g/dL (ref 30.0–36.0)
MCV: 87.2 fL (ref 80.0–100.0)
Monocytes Absolute: 1.2 10*3/uL — ABNORMAL HIGH (ref 0.1–1.0)
Monocytes Relative: 10 %
Neutro Abs: 8.9 10*3/uL — ABNORMAL HIGH (ref 1.7–7.7)
Neutrophils Relative %: 76 %
Platelets: 431 10*3/uL — ABNORMAL HIGH (ref 150–400)
RBC: 5.31 MIL/uL (ref 4.22–5.81)
RDW: 13.8 % (ref 11.5–15.5)
WBC: 11.8 10*3/uL — ABNORMAL HIGH (ref 4.0–10.5)
nRBC: 0 % (ref 0.0–0.2)

## 2020-10-04 LAB — RESP PANEL BY RT-PCR (FLU A&B, COVID) ARPGX2
Influenza A by PCR: NEGATIVE
Influenza B by PCR: NEGATIVE
SARS Coronavirus 2 by RT PCR: NEGATIVE

## 2020-10-04 LAB — URINALYSIS, COMPLETE (UACMP) WITH MICROSCOPIC
Bilirubin Urine: NEGATIVE
Glucose, UA: 50 mg/dL — AB
Ketones, ur: 5 mg/dL — AB
Leukocytes,Ua: NEGATIVE
Nitrite: NEGATIVE
Protein, ur: 30 mg/dL — AB
Specific Gravity, Urine: 1.019 (ref 1.005–1.030)
Squamous Epithelial / HPF: NONE SEEN (ref 0–5)
pH: 5 (ref 5.0–8.0)

## 2020-10-04 LAB — COMPREHENSIVE METABOLIC PANEL
ALT: 27 U/L (ref 0–44)
AST: 33 U/L (ref 15–41)
Albumin: 4.7 g/dL (ref 3.5–5.0)
Alkaline Phosphatase: 79 U/L (ref 38–126)
Anion gap: 14 (ref 5–15)
BUN: 36 mg/dL — ABNORMAL HIGH (ref 6–20)
CO2: 18 mmol/L — ABNORMAL LOW (ref 22–32)
Calcium: 9.9 mg/dL (ref 8.9–10.3)
Chloride: 99 mmol/L (ref 98–111)
Creatinine, Ser: 4.7 mg/dL — ABNORMAL HIGH (ref 0.61–1.24)
GFR, Estimated: 14 mL/min — ABNORMAL LOW (ref >60)
Glucose, Bld: 116 mg/dL — ABNORMAL HIGH (ref 70–99)
Potassium: 4.1 mmol/L (ref 3.5–5.1)
Sodium: 131 mmol/L — ABNORMAL LOW (ref 135–145)
Total Bilirubin: 1.1 mg/dL (ref 0.3–1.2)
Total Protein: 8.7 g/dL — ABNORMAL HIGH (ref 6.5–8.1)

## 2020-10-04 LAB — LACTIC ACID, PLASMA
Lactic Acid, Venous: 1.9 mmol/L (ref 0.5–1.9)
Lactic Acid, Venous: 1.9 mmol/L (ref 0.5–1.9)

## 2020-10-04 LAB — PROTIME-INR
INR: 1.4 — ABNORMAL HIGH (ref 0.8–1.2)
Prothrombin Time: 17.1 seconds — ABNORMAL HIGH (ref 11.4–15.2)

## 2020-10-04 MED ORDER — SODIUM CHLORIDE 0.9 % IV SOLN: INTRAVENOUS | Status: DC

## 2020-10-04 MED ORDER — ONDANSETRON HCL 4 MG PO TABS: 4.0000 mg | ORAL_TABLET | Freq: Four times a day (QID) | ORAL | Status: DC | PRN

## 2020-10-04 MED ORDER — SODIUM CHLORIDE 0.9 % IV SOLN: Freq: Once | INTRAVENOUS | Status: AC

## 2020-10-04 MED ORDER — LACTATED RINGERS IV BOLUS
1000.0000 mL | Freq: Once | INTRAVENOUS | Status: AC
  Administered 2020-10-04: 1000 mL via INTRAVENOUS

## 2020-10-04 MED ORDER — SIMVASTATIN 20 MG PO TABS
20.0000 mg | ORAL_TABLET | Freq: Every day | ORAL | Status: DC
  Administered 2020-10-05 – 2020-10-06 (×2): 20 mg via ORAL
  Filled 2020-10-04 (×2): qty 1

## 2020-10-04 MED ORDER — APIXABAN 5 MG PO TABS
5.0000 mg | ORAL_TABLET | Freq: Two times a day (BID) | ORAL | Status: DC
  Administered 2020-10-04 – 2020-10-06 (×4): 5 mg via ORAL
  Filled 2020-10-04 (×4): qty 1

## 2020-10-04 MED ORDER — ONDANSETRON HCL 4 MG/2ML IJ SOLN: 4.0000 mg | Freq: Four times a day (QID) | INTRAMUSCULAR | Status: DC | PRN

## 2020-10-04 MED ORDER — SODIUM CHLORIDE 0.9 % IV BOLUS
1000.0000 mL | Freq: Once | INTRAVENOUS | Status: AC
  Administered 2020-10-04: 1000 mL via INTRAVENOUS

## 2020-10-04 NOTE — ED Triage Notes (Signed)
Pt to ED POV for emesis and diarrhea that started yesterday, reports feeling dehydrated and having syncopal episodes since.

## 2020-10-04 NOTE — Telephone Encounter (Signed)
FYI

## 2020-10-04 NOTE — ED Provider Notes (Signed)
[facility] Emergency Department Provider Note    Event Date/Time   First MD Initiated Contact with Patient 10/04/20 1453     (approximate)  I have reviewed the triage vital signs and the nursing notes.   HISTORY  Chief Complaint Emesis    HPI Joel Terry is a 55 y.o. male with below listed past medical history presents to the ER for evaluation of nausea vomiting diarrhea of having several fainting spells overnight.  Uncertain as to whether he hit his head.  Was recently started on Eliquis for history of PE.  Has been compliant with his medications.  He is also on lisinopril as well as HCTZ.  Has a history of CKD.  Denies any abdominal pain right now.  Has not had any blood in his stool.  No recent antibiotics.  Denies any chest pain or pressure.  Past Medical History:  Diagnosis Date   Asthma    Chronic kidney disease    Headache    History of kidney stones    Hypertension    Nephrolithiasis    Obesity    Ulcerative colitis ([place])    No family history on file. Past Surgical History:  Procedure Laterality Date   COLECTOMY     COLONOSCOPY WITH PROPOFOL N/A 09/16/2014   Procedure: COLONOSCOPY WITH PROPOFOL;  Surgeon: Lollie Sails, MD;  Location: [facility] ENDOSCOPY;  Service: Endoscopy;  Laterality: N/A;   FLEXIBLE SIGMOIDOSCOPY N/A 07/11/2017   Procedure: FLEXIBLE SIGMOIDOSCOPY;  Surgeon: Lollie Sails, MD;  Location: [facility] ENDOSCOPY;  Service: Endoscopy;  Laterality: N/A;   FLEXIBLE SIGMOIDOSCOPY N/A 10/23/2017   Procedure: FLEXIBLE SIGMOIDOSCOPY;  Surgeon: Lollie Sails, MD;  Location: [facility] ENDOSCOPY;  Service: Endoscopy;  Laterality: N/A;   FLEXIBLE SIGMOIDOSCOPY N/A 08/06/2019   Procedure: FLEXIBLE SIGMOIDOSCOPY;  Surgeon: Jonathon Bellows, MD;  Location: [facility] ENDOSCOPY;  Service: Gastroenterology;  Laterality: N/A;   HERNIA REPAIR     Patient Active Problem List   Diagnosis Date Noted   Single subsegmental pulmonary embolism without acute  cor pulmonale ([place]) 08/24/2020   Recurrent major depressive disorder, in partial remission ([place]) 08/12/2019   Asthma 06/10/2018   Incomplete emptying of bladder 12/01/2015   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 Authorizing Provider  albuterol (VENTOLIN HFA) 108 (90 Base) MCG/ACT inhaler Inhale 2 puffs into the lungs every 6 (six) hours as needed for wheezing or shortness of breath. 04/03/20   Flinchum, Kelby Aline, FNP  apixaban (ELIQUIS) 5 MG TABS tablet Take 1 tablet (5 mg total) by mouth 2 (two) times daily. 08/24/20   Bacigalupo, Dionne Bucy, MD  diazepam (VALIUM) 5 MG tablet Take 5 mg by mouth every 8 (eight) hours as needed for anxiety.    [provider]  EPINEPHrine 0.3 mg/0.3 mL IJ SOAJ injection Inject 0.3 mLs (0.3 mg total) into the muscle as needed for anaphylaxis. 11/25/19   Trinna Post, PA-C  hydrochlorothiazide (HYDRODIURIL) 25 MG tablet Take 1 tablet (25 mg total) by mouth daily. 07/11/20   Flinchum, Kelby Aline, FNP  lisinopril (ZESTRIL) 40 MG tablet Take 1 tablet (40 mg  total) by mouth daily. 07/11/20   Flinchum, Kelby Aline, FNP  simvastatin (ZOCOR) 20 MG tablet Take 1 tablet (20 mg total) by mouth daily at 6 PM. 07/11/20   Flinchum, Kelby Aline, FNP  SUMAtriptan (IMITREX) 100 MG tablet Take 1 tablet (100 mg total) by mouth every 2 (two) hours as needed for migraine. May repeat in 2 hours if headache persists or recurs. 08/12/19   Mar Daring, PA-C    Allergies Sulfa antibiotics and Elemental sulfur    Social History Social History   Tobacco Use   Smoking  status: Never   Smokeless tobacco: Never  Vaping Use   Vaping Use: Never used  Substance Use Topics   Alcohol use: Yes    Alcohol/week: 3.0 standard drinks    Types: 3 Cans of beer per week    Comment: Occassional   Drug use: No    Review of Systems Patient denies headaches, rhinorrhea, blurry vision, numbness, shortness of breath, chest pain, edema, cough, abdominal pain, nausea, vomiting, diarrhea, dysuria, fevers, rashes or hallucinations unless otherwise stated above in HPI. ____________________________________________   PHYSICAL EXAM:  VITAL SIGNS: Vitals:   10/04/20 1446 10/04/20 1448  BP:  (!) 75/64  Pulse:  (!) 113  Resp: 18   Temp: 97.6 F (36.4 C)   SpO2: 98%     Constitutional: Alert and oriented.  Eyes: Conjunctivae are normal.  Head: Atraumatic. Nose: No congestion/rhinnorhea. Mouth/Throat: Mucous membranes are moist.   Neck: No stridor. Painless ROM.  Cardiovascular: mildly tachycardic rate, regular rhythm. Grossly normal heart sounds.  Good peripheral circulation. Respiratory: Normal respiratory effort.  No retractions. Lungs CTAB. Gastrointestinal: Soft and nontender. No distention. No abdominal bruits. No CVA tenderness. Genitourinary:  Musculoskeletal: No lower extremity tenderness nor edema.  No joint effusions. Neurologic:  Normal speech and language. No gross focal neurologic deficits are appreciated. No facial droop Skin:  Skin is warm, dry and intact. No rash noted. Psychiatric: Mood and affect are normal. Speech and behavior are normal.  ____________________________________________   LABS (all labs ordered are listed, but only abnormal results are displayed)  Results for orders placed or performed during the hospital encounter of 10/04/20 (from the past 24 hour(s))  Comprehensive metabolic panel     Status: Abnormal   Collection Time: 10/04/20  2:51 PM  Result Value Ref Range   Sodium 131 (L) 135 - 145 mmol/L   Potassium 4.1 3.5 - 5.1  mmol/L   Chloride 99 98 - 111 mmol/L   CO2 18 (L) 22 - 32 mmol/L   Glucose, Bld 116 (H) 70 - 99 mg/dL   BUN 36 (H) 6 - 20 mg/dL   Creatinine, Ser 4.70 (H) 0.61 - 1.24 mg/dL   Calcium 9.9 8.9 - 10.3 mg/dL   Total Protein 8.7 (H) 6.5 - 8.1 g/dL   Albumin 4.7 3.5 - 5.0 g/dL   AST 33 15 - 41 U/L   ALT 27 0 - 44 U/L   Alkaline Phosphatase 79 38 - 126 U/L   Total Bilirubin 1.1 0.3 - 1.2 mg/dL   GFR, Estimated 14 (L) >60 mL/min   Anion gap 14 5 - 15  Lactic acid, plasma     Status: None   Collection Time: 10/04/20  2:51 PM  Result Value Ref Range   Lactic Acid, Venous 1.9 0.5 - 1.9 mmol/L  CBC with Differential     Status: Abnormal   Collection Time: 10/04/20  2:51 PM  Result Value Ref Range   WBC  11.8 (H) 4.0 - 10.5 K/uL   RBC 5.31 4.22 - 5.81 MIL/uL   Hemoglobin 16.4 13.0 - 17.0 g/dL   HCT 46.3 39.0 - 52.0 %   MCV 87.2 80.0 - 100.0 fL   MCH 30.9 26.0 - 34.0 pg   MCHC 35.4 30.0 - 36.0 g/dL   RDW 13.8 11.5 - 15.5 %   Platelets 431 (H) 150 - 400 K/uL   nRBC 0.0 0.0 - 0.2 %   Neutrophils Relative % 76 %   Neutro Abs 8.9 (H) 1.7 - 7.7 K/uL   Lymphocytes Relative 14 %   Lymphs Abs 1.6 0.7 - 4.0 K/uL   Monocytes Relative 10 %   Monocytes Absolute 1.2 (H) 0.1 - 1.0 K/uL   Eosinophils Relative 0 %   Eosinophils Absolute 0.0 0.0 - 0.5 K/uL   Basophils Relative 0 %   Basophils Absolute 0.0 0.0 - 0.1 K/uL   Immature Granulocytes 0 %   Abs Immature Granulocytes 0.03 0.00 - 0.07 K/uL  Protime-INR     Status: Abnormal   Collection Time: 10/04/20  2:51 PM  Result Value Ref Range   Prothrombin Time 17.1 (H) 11.4 - 15.2 seconds   INR 1.4 (H) 0.8 - 1.2   ____________________________________________  EKG My review and personal interpretation at Time: 14:52   Indication: syncope  Rate: 105  Rhythm: sinus Axis: normal Other: normal intervals, no stemi ____________________________________________  RADIOLOGY  I personally reviewed all radiographic images ordered to evaluate for the  above acute complaints and reviewed radiology reports and findings.  These findings were personally discussed with the patient.  Please see medical record for radiology report.  ____________________________________________   PROCEDURES  Procedure(s) performed:  .Critical Care  Date/Time: 10/04/2020 4:40 PM Performed by: Merlyn Lot, MD Authorized by: Merlyn Lot, MD   Critical care provider statement:    Critical care time (minutes):  35   Critical care time was exclusive of:  Separately billable procedures and treating other patients   Critical care was necessary to treat or prevent imminent or life-threatening deterioration of the following conditions:  Dehydration and renal failure   Critical care was time spent personally by me on the following activities:  Development of treatment plan with patient or surrogate, discussions with consultants, evaluation of patient's response to treatment, examination of patient, obtaining history from patient or surrogate, ordering and performing treatments and interventions, ordering and review of laboratory studies, ordering and review of radiographic studies, pulse oximetry, re-evaluation of patient's condition and review of old charts    Critical Care performed: yes ____________________________________________   INITIAL IMPRESSION / ASSESSMENT AND PLAN / ED COURSE  Pertinent labs & imaging results that were available during my care of the patient were reviewed by me and considered in my medical decision making (see chart for details).   DDX: Hydration, electrolyte abnormality, renal failure, anemia, sepsis, SDH, IPH  KUPER RENNELS is a 55 y.o. who presents to the ED with chief presentation as described above.  Patient arrives hypertensive mildly tachycardic but afebrile.  Does appear dehydrated.  Exam is reassuring however.  Blood work sent for the above differential.  Given syncopal episode with fall on Eliquis order CT imaging.   The patient will be placed on continuous pulse oximetry and telemetry for monitoring.  Laboratory evaluation will be sent to evaluate for the above complaints.     Clinical Course as of 10/04/20 1642  Wed Oct 04, 2020  1554 With evidence of acute AKI with  metabolic acidosis.  Lactate normal.  CT imaging ordered for the but differential. [PR]    Clinical Course User Index [PR] Merlyn Lot, MD   CT imaging without evidence of acute abnormality.  Patient improved with IV hydration but given his evidence of significant AKI with hypotension  will discuss with hospitalist for additional IV hydration and reassessment.  Patient agreeable to plan.  The patient was evaluated in Emergency Department today for the symptoms described in the history of present illness. He/she was evaluated in the context of the global COVID-19 pandemic, which necessitated consideration that the patient might be at risk for infection with the SARS-CoV-2 virus that causes COVID-19. Institutional protocols and algorithms that pertain to the evaluation of patients at risk for COVID-19 are in a state of rapid change based on information released by regulatory bodies including the CDC and federal and state organizations. These policies and algorithms were followed during the patient's care in the ED.  As part of my medical decision making, I reviewed the following data within the [facility] notes reviewed and incorporated, Labs reviewed, notes from prior ED visits and [place] Controlled Substance Database   ____________________________________________   FINAL CLINICAL IMPRESSION(S) / ED DIAGNOSES  Final diagnoses:  Nausea vomiting and diarrhea  Dehydration  AKI (acute kidney injury) ([place])      NEW MEDICATIONS STARTED DURING THIS VISIT:  New Prescriptions   No medications on file     Note:  This document was prepared using Dragon voice recognition software and may include unintentional  dictation errors.    Merlyn Lot, MD 10/04/20 [phone]

## 2020-10-04 NOTE — H&P (Signed)
History and Physical        Hospital Admission Note Date: 10/04/2020  Patient name: Joel Terry Medical record number: [id number] Date of birth: [birth year] Age: 55 y.o. Gender: male  PCP: Virginia Crews, MD    Patient coming from: Home   I have reviewed all records in the [place].    Chief Complaint:  Diarrhea, Syncope   HPI: Joel Terry is 55 y.o. male with PMH of HTN, HLD, ulcerative colitis with colon resection, recent PE post-operative on Eliquis who presents for diarrhea, emesis, and syncope. Reports this all started yesterday. He has had 4 episodes of syncope. One was unwitnessed so unsure about if he hit his head that time. He has been having recurrent episodes of diarrhea. Two episodes of vomiting. No blood in stool or vomit. No recent antibiotic use. No known sick contacts with GI symptoms. Did eat [place] food day prior to these events starting. Reports his wife was eating out with him and did not eat her meal due to concern about "not tasting right". Says this is very different than typical UC symptoms. Has not had episodes of syncope in past. Thinks very dehydrated as has barely urinated since yesterday. No known CKD. Feeling a bit better since receiving IVFs in ED.    ED work-up/course:    DDX: Hydration, electrolyte abnormality, renal failure, anemia, sepsis, SDH, IPH   Joel Terry is a 55 y.o. who presents to the ED with chief presentation as described above.  Patient arrives hypertensive mildly tachycardic but afebrile.  Does appear dehydrated.  Exam is reassuring however.  Blood work sent for the above differential.  Given syncopal episode with fall on Eliquis order CT imaging.  The patient will be placed on continuous pulse oximetry and telemetry for monitoring.  Laboratory evaluation will be sent to evaluate for the above complaints.          Clinical Course as of 10/04/20 1642  Wed  Oct 04, 2020  1554 With evidence of acute AKI with metabolic acidosis.  Lactate normal.  CT imaging ordered for the but differential. [PR]      CT imaging without evidence of acute abnormality.  Patient improved with IV hydration but given his evidence of significant AKI with hypotension  will discuss with hospitalist for additional IV hydration and reassessment.  Patient agreeable to plan.  Review of Systems: Positives marked in 'bold' Constitutional: Denies fever, chills, diaphoresis, poor appetite and fatigue.  HEENT: Denies photophobia, eye pain, redness, hearing loss, ear pain, congestion, sore throat, rhinorrhea, sneezing, mouth sores, trouble swallowing, neck pain, neck stiffness and tinnitus.   Respiratory: Denies SOB, DOE, cough, chest tightness,  and wheezing.   Cardiovascular: Denies chest pain, palpitations and leg swelling.  Gastrointestinal: Denies nausea, vomiting, abdominal pain, diarrhea, constipation, blood in stool and abdominal distention.  Genitourinary: Denies dysuria, urgency, frequency, hematuria, flank pain and difficulty urinating.  Musculoskeletal: Denies myalgias, back pain, joint swelling, arthralgias and gait problem.  Skin: Denies pallor, rash and wound.  Neurological: Denies dizziness, seizures, syncope, weakness, light-headedness, numbness and headaches.  Hematological: Denies adenopathy. Easy bruising, personal or family bleeding history  Psychiatric/Behavioral: Denies suicidal  ideation, mood changes, confusion, nervousness, sleep disturbance and agitation  Past Medical History: Past Medical History:  Diagnosis Date   Asthma    Chronic kidney disease    Headache    History of kidney stones    Hypertension    Nephrolithiasis    Obesity    Ulcerative colitis ([place])     Past Surgical History:  Procedure Laterality Date   COLECTOMY     COLONOSCOPY WITH PROPOFOL N/A 09/16/2014   Procedure: COLONOSCOPY WITH PROPOFOL;  Surgeon: Lollie Sails, MD;   Location: [facility] ENDOSCOPY;  Service: Endoscopy;  Laterality: N/A;   FLEXIBLE SIGMOIDOSCOPY N/A 07/11/2017   Procedure: FLEXIBLE SIGMOIDOSCOPY;  Surgeon: Lollie Sails, MD;  Location: [facility] ENDOSCOPY;  Service: Endoscopy;  Laterality: N/A;   FLEXIBLE SIGMOIDOSCOPY N/A 10/23/2017   Procedure: FLEXIBLE SIGMOIDOSCOPY;  Surgeon: Lollie Sails, MD;  Location: [facility] ENDOSCOPY;  Service: Endoscopy;  Laterality: N/A;   FLEXIBLE SIGMOIDOSCOPY N/A 08/06/2019   Procedure: FLEXIBLE SIGMOIDOSCOPY;  Surgeon: Jonathon Bellows, MD;  Location: [facility] ENDOSCOPY;  Service: Gastroenterology;  Laterality: N/A;   HERNIA REPAIR      Medications: Prior to Admission medications   Medication Sig Start Date End Date Taking? Authorizing Provider  albuterol (VENTOLIN HFA) 108 (90 Base) MCG/ACT inhaler Inhale 2 puffs into the lungs every 6 (six) hours as needed for wheezing or shortness of breath. 04/03/20   Flinchum, Kelby Aline, FNP  apixaban (ELIQUIS) 5 MG TABS tablet Take 1 tablet (5 mg total) by mouth 2 (two) times daily. 08/24/20   Bacigalupo, Dionne Bucy, MD  diazepam (VALIUM) 5 MG tablet Take 5 mg by mouth every 8 (eight) hours as needed for anxiety.    [provider]  EPINEPHrine 0.3 mg/0.3 mL IJ SOAJ injection Inject 0.3 mLs (0.3 mg total) into the muscle as needed for anaphylaxis. 11/25/19   Trinna Post, PA-C  hydrochlorothiazide (HYDRODIURIL) 25 MG tablet Take 1 tablet (25 mg total) by mouth daily. 07/11/20   Flinchum, Kelby Aline, FNP  lisinopril (ZESTRIL) 40 MG tablet Take 1 tablet (40 mg total) by mouth daily. 07/11/20   Flinchum, Kelby Aline, FNP  simvastatin (ZOCOR) 20 MG tablet Take 1 tablet (20 mg total) by mouth daily at 6 PM. 07/11/20   Flinchum, Kelby Aline, FNP  SUMAtriptan (IMITREX) 100 MG tablet Take 1 tablet (100 mg total) by mouth every 2 (two) hours as needed for migraine. May repeat in 2 hours if headache persists or recurs. 08/12/19   Mar Daring, PA-C    Allergies:   Allergies   Allergen Reactions   Sulfa Antibiotics Hives    Increased HR and BP   Elemental Sulfur Rash and Other (See Comments)    Elevated BP and heart rate    Social History:  reports that he has never smoked. He has never used smokeless tobacco. He reports current alcohol use of about 3.0 standard drinks of alcohol per week. He reports that he does not use drugs.  Family History: No family history on file.  Physical Exam: Blood pressure (!) 75/64, pulse (!) 113, temperature 97.6 F (36.4 C), temperature source Oral, resp. rate 18, height 5' 8"  (1.727 m), weight 106.6 kg, SpO2 98 %. General: Alert, awake, oriented x3, in no acute distress. Eyes: pink conjunctiva,anicteric sclera, pupils equal and reactive to light and accomodation, HEENT: normocephalic, atraumatic, oropharynx clear, mucus membranes dry  Neck: supple, no masses or lymphadenopathy, no goiter, no bruits, no JVD CVS: Tachycardic rate and regular rhythm, without murmurs, rubs  or gallops. No lower extremity edema Resp : Clear to auscultation bilaterally, no wheezing, rales or rhonchi. GI : Soft, nontender, nondistended, positive bowel sounds, no masses. No hepatomegaly. No hernia.  Musculoskeletal: No clubbing or cyanosis, positive pedal pulses. No contracture. ROM intact  Neuro: Grossly intact, no focal neurological deficits, strength 5/5 upper and lower extremities bilaterally Psych: alert and oriented x 3, normal mood and affect Skin: no rashes or lesions, warm and dry   LABS on Admission: I have personally reviewed all the labs and imagings below    Basic Metabolic Panel: Recent Labs  Lab 10/04/20 1451  NA 131*  K 4.1  CL 99  CO2 18*  GLUCOSE 116*  BUN 36*  CREATININE 4.70*  CALCIUM 9.9   Liver Function Tests: Recent Labs  Lab 10/04/20 1451  AST 33  ALT 27  ALKPHOS 79  BILITOT 1.1  PROT 8.7*  ALBUMIN 4.7   No results for input(s): LIPASE, AMYLASE in the last 168 hours. No results for input(s): AMMONIA  in the last 168 hours. CBC: Recent Labs  Lab 10/04/20 1451  WBC 11.8*  NEUTROABS 8.9*  HGB 16.4  HCT 46.3  MCV 87.2  PLT 431*   Cardiac Enzymes: No results for input(s): CKTOTAL, CKMB, CKMBINDEX, TROPONINI in the last 168 hours. BNP: Invalid input(s): POCBNP CBG: No results for input(s): GLUCAP in the last 168 hours.  Radiological Exams on Admission:  CT ABDOMEN PELVIS WO CONTRAST  Result Date: 10/04/2020 CLINICAL DATA:  Nausea and vomiting and syncopal episodes EXAM: CT ABDOMEN AND PELVIS WITHOUT CONTRAST TECHNIQUE: Multidetector CT imaging of the abdomen and pelvis was performed following the standard protocol without IV contrast. COMPARISON:  06/02/2019 FINDINGS: Lower chest: Lung bases are well aerated with evidence of a tiny area of parenchymal opacity in the right posterior costophrenic angle. This may represent atelectasis or early infiltrate. Hepatobiliary: No focal liver abnormality is seen. No gallstones, gallbladder wall thickening, or biliary dilatation. Pancreas: Unremarkable. No pancreatic ductal dilatation or surrounding inflammatory changes. Spleen: Normal in size without focal abnormality. Adrenals/Urinary Tract: Adrenal glands are within normal limits. Kidneys demonstrate bilateral renal cysts similar to that noted on prior exam. Multiple parapelvic cysts are noted as well nonobstructing renal stone is noted in the lower pole of the left kidney stable in appearance from the prior exam. No obstructive changes are noted in the ureters. The bladder is decompressed. Stomach/Bowel: Postsurgical changes are seen in the distal colon stable in appearance from the prior exam. Patent anastomosis is noted. Small bowel is unremarkable. No gastric abnormality is seen. Vascular/Lymphatic: No significant vascular findings are present. No enlarged abdominal or pelvic lymph nodes. Small stable lymph node is noted in the right internal iliac chain similar to the prior exam. No new sizable  adenopathy is seen Reproductive: Prostate is unremarkable. Other: No abdominal wall hernia or abnormality. No abdominopelvic ascites. Musculoskeletal: No acute or significant osseous findings. IMPRESSION: Changes consistent with subtotal colectomy. No acute abnormality is noted. Patchy area of increased opacity in the right posterior costophrenic angle likely representing atelectasis or early infiltrate. Nonobstructing left renal stone stable from previous exam. Electronically Signed   By: Inez Catalina M.D.   On: 10/04/2020 16:07   DG Chest 2 View  Result Date: 10/04/2020 CLINICAL DATA:  Suspected sepsis. Vomiting and diarrhea. Syncopal episode. EXAM: CHEST - 2 VIEW COMPARISON:  04/21/2020 FINDINGS: The cardiomediastinal contours are normal. The lungs are clear. Pulmonary vasculature is normal. No consolidation, pleural effusion, or pneumothorax. No acute osseous  abnormalities are seen. IMPRESSION: Negative radiographs of the chest. Electronically Signed   By: Keith Rake M.D.   On: 10/04/2020 16:01   CT Head Wo Contrast  Result Date: 10/04/2020 CLINICAL DATA:  Recent syncopal episode EXAM: CT HEAD WITHOUT CONTRAST TECHNIQUE: Contiguous axial images were obtained from the base of the skull through the vertex without intravenous contrast. COMPARISON:  None. FINDINGS: Brain: No evidence of acute infarction, hemorrhage, hydrocephalus, extra-axial collection or mass lesion/mass effect. Vascular: No hyperdense vessel or unexpected calcification. Skull: Normal. Negative for fracture or focal lesion. Sinuses/Orbits: No acute finding. Other: None. IMPRESSION: No acute intracranial abnormality noted. Electronically Signed   By: Inez Catalina M.D.   On: 10/04/2020 16:08      EKG: Independently reviewed. Sinus tachycardia.    Assessment/Plan Active Problems:   Benign essential HTN   HLD (hyperlipidemia)   Ulcerative colitis ([place])   H/O renal calculi   Single subsegmental pulmonary embolism without  acute cor pulmonale (HCC)   AKI (acute kidney injury) (HCC)   Acute diarrhea   Emesis   Syncope   Hypotension   Hyponatremia   Leukocytosis   History of colon resection   AKI  Cr 4.7 (baseline around 0.9-1.1), BUN 36, GFR 14. Suspect related to profound dehydration from gastrointestinal losses. Electrolytes stable and patient has no evidence of uremic encephalopathy; no indication for HD at present. Given IVF bolus in ED. CT abdomen shows non-obstructing renal stone, no obstructive changes in ureters, bladder decompressed.  -observation in MedSurg  -continue IVF at 125 cc/hr  -strict I/Os  -avoid nephrotoxic agents  -hold Lisinopril and HCTZ  -BMET in AM -if not improving with fluids, would consider further work up with labs and renal sono   Diarrhea/Emesis  May be related to food products. Does have underlying h/o ulcerative colitis. CT abdomen benign. Lactic acid normal. Very mild leukocytosis.  -stool studies  -C. Diff  -Zofran prn nausea/vomiting  -monitor electrolytes  -given acuity did not evaluate for more chronic causes such as hyperthyroidism  -ED provider ordered blood cultures   Syncope  Suspect related to dehydration and resulting hypotension. EKG showed no arrhythmias. CT head neg.  -fall precautions  -orthostatic vital signs q shift  -if does not improve with fluids, would consider further work up to include echo   Hyponatremia  Na 131. Likely related to GI losses in setting of use of thiazide diuretic.  -IVF as above  -hold HCTZ   History of PE on Anticoagulation  -continue Eliquis   HTN  Hypotensive at presentation.  -hold BP medications   HLD  -continue Simvastatin   DVT prophylaxis: On Eliquis   CODE STATUS: Full   Consults called: None    Admission status: Observation   The medical decision making on this patient was of high complexity and the patient is at high risk for clinical deterioration, therefore this is a level 3  admission.  Severity of Illness:      The appropriate patient status for this patient is OBSERVATION. Observation status is judged to be reasonable and necessary in order to provide the required intensity of service to ensure the patient's safety. The patient's presenting symptoms, physical exam findings, and initial radiographic and laboratory data in the context of their medical condition is felt to place them at decreased risk for further clinical deterioration. Furthermore, it is anticipated that the patient will be medically stable for discharge from the hospital within 2 midnights of admission. The following factors support the patient  status of observation.   " The patient's presenting symptoms include syncope, diarrhea, vomiting. " The physical exam findings include tachycardia. " The initial radiographic and laboratory data are Cr 4, CT abdomen and head neg, Na 131, WBC 11.   Time Spent on Admission: 40 minutes      Melina Schools D.O.  [facility] 10/04/2020, 5:19 PM

## 2020-10-04 NOTE — ED Notes (Signed)
Patient transported to CT 

## 2020-10-04 NOTE — Progress Notes (Signed)
Patient arrived to unit.  No acute distress noted.  Orthostatic vitals checked. Patient and family oriented to room.  Report given to oncoming nurse.  Care relinquished.

## 2020-10-04 NOTE — Telephone Encounter (Signed)
Vomiting and diarrhea since yesterday- unable to keep anything down. Patient fainted last night- wife is calling to see if patient can be seen in office. Advised due to risk of dehydration needs to go to ED. Reason for Disposition  [1] Drinking very little AND [2] dehydration suspected (e.g., no urine > 12 hours, very dry mouth, very lightheaded)  Answer Assessment - Initial Assessment Questions 1. DIARRHEA SEVERITY: "How bad is the diarrhea?" "How many more stools have you had in the past 24 hours than normal?"    - NO DIARRHEA (SCALE 0)   - MILD (SCALE 1-3): Few loose or mushy BMs; increase of 1-3 stools over normal daily number of stools; mild increase in ostomy output.   -  MODERATE (SCALE 4-7): Increase of 4-6 stools daily over normal; moderate increase in ostomy output. * SEVERE (SCALE 8-10; OR 'WORST POSSIBLE'): Increase of 7 or more stools daily over normal; moderate increase in ostomy output; incontinence.     severe 2. ONSET: "When did the diarrhea begin?"      5pm 3. BM CONSISTENCY: "How loose or watery is the diarrhea?"      watery 4. VOMITING: "Are you also vomiting?" If Yes, ask: "How many times in the past 24 hours?"      Yes- Gatorade  5. ABDOMINAL PAIN: "Are you having any abdominal pain?" If Yes, ask: "What does it feel like?" (e.g., crampy, dull, intermittent, constant)      no 6. ABDOMINAL PAIN SEVERITY: If present, ask: "How bad is the pain?"  (e.g., Scale 1-10; mild, moderate, or severe)   - MILD (1-3): doesn't interfere with normal activities, abdomen soft and not tender to touch    - MODERATE (4-7): interferes with normal activities or awakens from sleep, abdomen tender to touch    - SEVERE (8-10): excruciating pain, doubled over, unable to do any normal activities       N/a 7. ORAL INTAKE: If vomiting, "Have you been able to drink liquids?" "How much liquids have you had in the past 24 hours?"     None- sips 8. HYDRATION: "Any signs of dehydration?" (e.g., dry mouth  [not just dry lips], too weak to stand, dizziness, new weight loss) "When did you last urinate?"     Weakness, dizziness- fainted last night 9. EXPOSURE: "Have you traveled to a foreign country recently?" "Have you been exposed to anyone with diarrhea?" "Could you have eaten any food that was spoiled?"     No- wife thinks possible food poisoning  10. ANTIBIOTIC USE: "Are you taking antibiotics now or have you taken antibiotics in the past 2 months?"       no 11. OTHER SYMPTOMS: "Do you have any other symptoms?" (e.g., fever, blood in stool)       fainting 12. PREGNANCY: "Is there any chance you are pregnant?" "When was your last menstrual period?"       N/a  Protocols used: [facility]

## 2020-10-05 DIAGNOSIS — K5190 Ulcerative colitis, unspecified, without complications: Secondary | ICD-10-CM | POA: Diagnosis present

## 2020-10-05 DIAGNOSIS — I10 Essential (primary) hypertension: Secondary | ICD-10-CM

## 2020-10-05 DIAGNOSIS — R1111 Vomiting without nausea: Secondary | ICD-10-CM

## 2020-10-05 DIAGNOSIS — I959 Hypotension, unspecified: Secondary | ICD-10-CM | POA: Diagnosis present

## 2020-10-05 DIAGNOSIS — R197 Diarrhea, unspecified: Secondary | ICD-10-CM | POA: Diagnosis not present

## 2020-10-05 DIAGNOSIS — Z9049 Acquired absence of other specified parts of digestive tract: Secondary | ICD-10-CM | POA: Diagnosis not present

## 2020-10-05 DIAGNOSIS — Z79899 Other long term (current) drug therapy: Secondary | ICD-10-CM | POA: Diagnosis not present

## 2020-10-05 DIAGNOSIS — N179 Acute kidney failure, unspecified: Principal | ICD-10-CM

## 2020-10-05 DIAGNOSIS — Z20822 Contact with and (suspected) exposure to covid-19: Secondary | ICD-10-CM | POA: Diagnosis present

## 2020-10-05 DIAGNOSIS — Z6835 Body mass index (BMI) 35.0-35.9, adult: Secondary | ICD-10-CM | POA: Diagnosis not present

## 2020-10-05 DIAGNOSIS — I2693 Single subsegmental pulmonary embolism without acute cor pulmonale: Secondary | ICD-10-CM

## 2020-10-05 DIAGNOSIS — E872 Acidosis: Secondary | ICD-10-CM | POA: Diagnosis present

## 2020-10-05 DIAGNOSIS — Z882 Allergy status to sulfonamides status: Secondary | ICD-10-CM | POA: Diagnosis not present

## 2020-10-05 DIAGNOSIS — R55 Syncope and collapse: Secondary | ICD-10-CM | POA: Diagnosis present

## 2020-10-05 DIAGNOSIS — E871 Hypo-osmolality and hyponatremia: Secondary | ICD-10-CM | POA: Diagnosis present

## 2020-10-05 DIAGNOSIS — E860 Dehydration: Secondary | ICD-10-CM | POA: Diagnosis present

## 2020-10-05 DIAGNOSIS — Z7901 Long term (current) use of anticoagulants: Secondary | ICD-10-CM | POA: Diagnosis not present

## 2020-10-05 DIAGNOSIS — E669 Obesity, unspecified: Secondary | ICD-10-CM | POA: Diagnosis present

## 2020-10-05 DIAGNOSIS — E785 Hyperlipidemia, unspecified: Secondary | ICD-10-CM | POA: Diagnosis present

## 2020-10-05 DIAGNOSIS — Z86711 Personal history of pulmonary embolism: Secondary | ICD-10-CM | POA: Diagnosis not present

## 2020-10-05 LAB — BASIC METABOLIC PANEL
Anion gap: 6 (ref 5–15)
BUN: 41 mg/dL — ABNORMAL HIGH (ref 6–20)
CO2: 27 mmol/L (ref 22–32)
Calcium: 8.8 mg/dL — ABNORMAL LOW (ref 8.9–10.3)
Chloride: 103 mmol/L (ref 98–111)
Creatinine, Ser: 3.16 mg/dL — ABNORMAL HIGH (ref 0.61–1.24)
GFR, Estimated: 23 mL/min — ABNORMAL LOW (ref >60)
Glucose, Bld: 125 mg/dL — ABNORMAL HIGH (ref 70–99)
Potassium: 4 mmol/L (ref 3.5–5.1)
Sodium: 136 mmol/L (ref 135–145)

## 2020-10-05 LAB — CBC
HCT: 40.8 % (ref 39.0–52.0)
Hemoglobin: 14.2 g/dL (ref 13.0–17.0)
MCH: 31.3 pg (ref 26.0–34.0)
MCHC: 34.8 g/dL (ref 30.0–36.0)
MCV: 90.1 fL (ref 80.0–100.0)
Platelets: 334 10*3/uL (ref 150–400)
RBC: 4.53 MIL/uL (ref 4.22–5.81)
RDW: 13.8 % (ref 11.5–15.5)
WBC: 7.5 10*3/uL (ref 4.0–10.5)
nRBC: 0 % (ref 0.0–0.2)

## 2020-10-05 LAB — GASTROINTESTINAL PANEL BY PCR, STOOL (REPLACES STOOL CULTURE)

## 2020-10-05 LAB — HIV ANTIBODY (ROUTINE TESTING W REFLEX): HIV Screen 4th Generation wRfx: NONREACTIVE

## 2020-10-05 LAB — C DIFFICILE QUICK SCREEN W PCR REFLEX
C Diff antigen: NEGATIVE
C Diff interpretation: NOT DETECTED
C Diff toxin: NEGATIVE

## 2020-10-05 NOTE — Telephone Encounter (Signed)
Noted  

## 2020-10-05 NOTE — Progress Notes (Signed)
PROGRESS NOTE  Joel Terry EQA:[id number] DOB: Jun 20, 1965 DOA: 10/04/2020 PCP: Virginia Crews, MD  HPI/Recap of past 24 hours: Joel Terry is 55 y.o. male with PMH of HTN, HLD, ulcerative colitis with colon resection, recent PE post-operative on Eliquis who presents for diarrhea, emesis, and syncope for the past 24H. He has had 4 episodes of syncope. One was unwitnessed so unsure about if he hit his head that time. He has been having recurrent episodes of diarrhea. Did eat [place] food day prior to these events starting. Reports his wife was eating out with him and did not eat her meal due to concern about "not tasting right". Says this is very different than typical UC symptoms. Has not had episodes of syncope in past. Thinks very dehydrated as has barely urinated PTA. No known CKD.  In the ED, patient was noted to be hypotensive, tachycardic but afebrile.  Labs showed creatinine of 4.7, baseline normal, sodium 131, all other labs fairly stable.  CT abdomen/pelvis showed nonobstructive renal stone, no obstructive changes and ureters, bladder Decompressed.  Patient was admitted for further management.    Today, patient denies any new complaints, no further nausea/vomiting or abdominal pain, still with loose stools but reports its like his baseline since after his surgery.  Denies any fever/chills, chest pain, shortness of breath.  Assessment/Plan: Active Problems:   Benign essential HTN   HLD (hyperlipidemia)   Ulcerative colitis ([place])   H/O renal calculi   Single subsegmental pulmonary embolism without acute cor pulmonale (HCC)   AKI (acute kidney injury) (HCC)   Acute diarrhea   Emesis   Syncope   Hypotension   Hyponatremia   Leukocytosis   History of colon resection  AKI Cr 4.7 (baseline around 0.9-1.1), BUN 36, GFR 14 Likely 2/2 dehydration from gastrointestinal losses CT abdomen shows non-obstructing renal stone, no obstructive changes in ureters, bladder  decompressed Continue IV fluids, strict I/Os Avoid nephrotoxic agents, continue to hold Lisinopril and HCTZ Daily BMP   Possible gastroenteritis In the setting of underlying h/o ulcerative colitis CT abdomen as above, unremarkable Stool study for C.diff, GI panel, negative BC X 2 NGTD Continue IVF, prn zofran  Syncope Likely 2/2 dehydration/hypotension Orthostatic vital signs unremarkable EKG showed no arrhythmias CT head neg Fall precautions   HTN Hypotensive at presentation, BP improving, but still soft Continue to hold BP medications  History of PE on Anticoagulation Continue Eliquis  HLD Continue Simvastatin   Obesity Lifestyle modification advised      Estimated body mass index is 35.73 kg/m as calculated from the following:   Height as of this encounter: 5' 8"  (1.727 m).   Weight as of this encounter: 106.6 kg.     Code Status: Full  Family Communication: None at bedside  Disposition Plan: Status is: Inpatient  Remains inpatient appropriate because:IV treatments appropriate due to intensity of illness or inability to take PO  Dispo: The patient is from: Home              Anticipated d/c is to: Home              Patient currently is not medically stable to d/c.   Difficult to place patient No    Consultants: None  Procedures: None  Antimicrobials: None  DVT prophylaxis:  Eliquis   Objective: Vitals:   10/05/20 0928 10/05/20 0930 10/05/20 0932 10/05/20 1125  BP: 114/67 121/80 103/69 119/77  Pulse: 79 79 88 75  Resp: 18  18  Temp: 98.3 F (36.8 C)   98.6 F (37 C)  TempSrc: Oral   Oral  SpO2: 100% 99%  99%  Weight:      Height:        Intake/Output Summary (Last 24 hours) at 10/05/2020 1342 Last data filed at 10/05/2020 1100 Gross per 24 hour  Intake 1273.33 ml  Output 775 ml  Net 498.33 ml   Filed Weights   10/04/20 1447  Weight: 106.6 kg    Exam: General: NAD  Cardiovascular: S1, S2 present Respiratory:  CTAB Abdomen: Soft, nontender, nondistended, bowel sounds present, surgical scar noted Musculoskeletal: No bilateral pedal edema noted Skin: Normal Psychiatry: Normal mood    Data Reviewed: CBC: Recent Labs  Lab 10/04/20 1451 10/05/20 0456  WBC 11.8* 7.5  NEUTROABS 8.9*  --   HGB 16.4 14.2  HCT 46.3 40.8  MCV 87.2 90.1  PLT 431* 458   Basic Metabolic Panel: Recent Labs  Lab 10/04/20 1451 10/05/20 0456  NA 131* 136  K 4.1 4.0  CL 99 103  CO2 18* 27  GLUCOSE 116* 125*  BUN 36* 41*  CREATININE 4.70* 3.16*  CALCIUM 9.9 8.8*   GFR: Estimated Creatinine Clearance: 32.4 mL/min (A) (by C-G formula based on SCr of 3.16 mg/dL (H)). Liver Function Tests: Recent Labs  Lab 10/04/20 1451  AST 33  ALT 27  ALKPHOS 79  BILITOT 1.1  PROT 8.7*  ALBUMIN 4.7   No results for input(s): LIPASE, AMYLASE in the last 168 hours. No results for input(s): AMMONIA in the last 168 hours. Coagulation Profile: Recent Labs  Lab 10/04/20 1451  INR 1.4*   Cardiac Enzymes: No results for input(s): CKTOTAL, CKMB, CKMBINDEX, TROPONINI in the last 168 hours. BNP (last 3 results) No results for input(s): PROBNP in the last 8760 hours. HbA1C: No results for input(s): HGBA1C in the last 72 hours. CBG: No results for input(s): GLUCAP in the last 168 hours. Lipid Profile: No results for input(s): CHOL, HDL, LDLCALC, TRIG, CHOLHDL, LDLDIRECT in the last 72 hours. Thyroid Function Tests: No results for input(s): TSH, T4TOTAL, FREET4, T3FREE, THYROIDAB in the last 72 hours. Anemia Panel: No results for input(s): VITAMINB12, FOLATE, FERRITIN, TIBC, IRON, RETICCTPCT in the last 72 hours. Urine analysis:    Component Value Date/Time   COLORURINE AMBER (A) 10/04/2020 1715   APPEARANCEUR CLOUDY (A) 10/04/2020 1715   LABSPEC 1.019 10/04/2020 1715   PHURINE 5.0 10/04/2020 1715   GLUCOSEU 50 (A) 10/04/2020 1715   HGBUR MODERATE (A) 10/04/2020 1715   BILIRUBINUR NEGATIVE 10/04/2020 1715    KETONESUR 5 (A) 10/04/2020 1715   PROTEINUR 30 (A) 10/04/2020 1715   NITRITE NEGATIVE 10/04/2020 1715   LEUKOCYTESUR NEGATIVE 10/04/2020 1715   Sepsis Labs: @LABRCNTIP (procalcitonin:4,lacticidven:4)  ) Recent Results (from the past 240 hour(s))  Culture, blood (Routine x 2)     Status: None (Preliminary result)   Collection Time: 10/04/20  2:51 PM   Specimen: BLOOD  Result Value Ref Range Status   Specimen Description BLOOD BLOOD RIGHT WRIST  Final   Special Requests   Final    BOTTLES DRAWN AEROBIC AND ANAEROBIC Blood Culture adequate volume   Culture   Final    NO GROWTH < 24 HOURS Performed at [facility], [address] [place], [place] [address]    Report Status PENDING  Incomplete  Resp Panel by RT-PCR (Flu A&B, Covid) Nasopharyngeal Swab     Status: None   Collection Time: 10/04/20  4:13 PM  Specimen: Nasopharyngeal Swab; Nasopharyngeal(NP) swabs in vial transport medium  Result Value Ref Range Status   SARS Coronavirus 2 by RT PCR NEGATIVE NEGATIVE Final    Comment: (NOTE) SARS-CoV-2 target nucleic acids are NOT DETECTED.  The SARS-CoV-2 RNA is generally detectable in upper respiratory specimens during the acute phase of infection. The lowest concentration of SARS-CoV-2 viral copies this assay can detect is 138 copies/mL. A negative result does not preclude SARS-Cov-2 infection and should not be used as the sole basis for treatment or other patient management decisions. A negative result may occur with  improper specimen collection/handling, submission of specimen other than nasopharyngeal swab, presence of viral mutation(s) within the areas targeted by this assay, and inadequate number of viral copies(<138 copies/mL). A negative result must be combined with clinical observations, patient history, and epidemiological information. The expected result is Negative.  Fact Sheet for Patients:  EntrepreneurPulse.com.au  Fact Sheet for  Healthcare Providers:  IncredibleEmployment.be  This test is no t yet approved or cleared by the [place] FDA and  has been authorized for detection and/or diagnosis of SARS-CoV-2 by FDA under an Emergency Use Authorization (EUA). This EUA will remain  in effect (meaning this test can be used) for the duration of the COVID-19 declaration under Section 564(b)(1) of the Act, 21 U.S.C.section 360bbb-3(b)(1), unless the authorization is terminated  or revoked sooner.       Influenza A by PCR NEGATIVE NEGATIVE Final   Influenza B by PCR NEGATIVE NEGATIVE Final    Comment: (NOTE) The Xpert Xpress SARS-CoV-2/FLU/RSV plus assay is intended as an aid in the diagnosis of influenza from Nasopharyngeal swab specimens and should not be used as a sole basis for treatment. Nasal washings and aspirates are unacceptable for Xpert Xpress SARS-CoV-2/FLU/RSV testing.  Fact Sheet for Patients: EntrepreneurPulse.com.au  Fact Sheet for Healthcare Providers: IncredibleEmployment.be  This test is not yet approved or cleared by the [place] FDA and has been authorized for detection and/or diagnosis of SARS-CoV-2 by FDA under an Emergency Use Authorization (EUA). This EUA will remain in effect (meaning this test can be used) for the duration of the COVID-19 declaration under Section 564(b)(1) of the Act, 21 U.S.C. section 360bbb-3(b)(1), unless the authorization is terminated or revoked.  Performed at [facility], [address] [place], [place] [address]   Culture, blood (Routine x 2)     Status: None (Preliminary result)   Collection Time: 10/04/20  5:14 PM   Specimen: BLOOD  Result Value Ref Range Status   Specimen Description BLOOD BLOOD RIGHT FOREARM  Final   Special Requests   Final    BOTTLES DRAWN AEROBIC AND ANAEROBIC Blood Culture results may not be optimal due to an inadequate volume of blood received in culture  bottles   Culture   Final    NO GROWTH < 24 HOURS Performed at [facility], [address] [place], [place] [address]    Report Status PENDING  Incomplete  Gastrointestinal Panel by PCR , Stool     Status: None   Collection Time: 10/05/20 10:39 AM   Specimen: Stool  Result Value Ref Range Status   Campylobacter species NOT DETECTED NOT DETECTED Final   Plesimonas shigelloides NOT DETECTED NOT DETECTED Final   Salmonella species NOT DETECTED NOT DETECTED Final   Yersinia enterocolitica NOT DETECTED NOT DETECTED Final   Vibrio species NOT DETECTED NOT DETECTED Final   Vibrio cholerae NOT DETECTED NOT DETECTED Final   Enteroaggregative E coli (EAEC) NOT  DETECTED NOT DETECTED Final   Enteropathogenic E coli (EPEC) NOT DETECTED NOT DETECTED Final   Enterotoxigenic E coli (ETEC) NOT DETECTED NOT DETECTED Final   Shiga like toxin producing E coli (STEC) NOT DETECTED NOT DETECTED Final   Shigella/Enteroinvasive E coli (EIEC) NOT DETECTED NOT DETECTED Final   Cryptosporidium NOT DETECTED NOT DETECTED Final   Cyclospora cayetanensis NOT DETECTED NOT DETECTED Final   Entamoeba histolytica NOT DETECTED NOT DETECTED Final   Giardia lamblia NOT DETECTED NOT DETECTED Final   Adenovirus F40/41 NOT DETECTED NOT DETECTED Final   Astrovirus NOT DETECTED NOT DETECTED Final   Norovirus GI/GII NOT DETECTED NOT DETECTED Final   Rotavirus A NOT DETECTED NOT DETECTED Final   Sapovirus (I, II, IV, and V) NOT DETECTED NOT DETECTED Final    Comment: Performed at [facility], [address] [place], [place] [address]  C Difficile Quick Screen w PCR reflex     Status: None   Collection Time: 10/05/20 10:39 AM   Specimen: Stool  Result Value Ref Range Status   C Diff antigen NEGATIVE NEGATIVE Final   C Diff toxin NEGATIVE NEGATIVE Final   C Diff interpretation No C. difficile detected.  Final    Comment: Performed at [facility], [address] [place], [place] [address]       Studies: CT ABDOMEN PELVIS WO CONTRAST  Result Date: 10/04/2020 CLINICAL DATA:  Nausea and vomiting and syncopal episodes EXAM: CT ABDOMEN AND PELVIS WITHOUT CONTRAST TECHNIQUE: Multidetector CT imaging of the abdomen and pelvis was performed following the standard protocol without IV contrast. COMPARISON:  06/02/2019 FINDINGS: Lower chest: Lung bases are well aerated with evidence of a tiny area of parenchymal opacity in the right posterior costophrenic angle. This may represent atelectasis or early infiltrate. Hepatobiliary: No focal liver abnormality is seen. No gallstones, gallbladder wall thickening, or biliary dilatation. Pancreas: Unremarkable. No pancreatic ductal dilatation or surrounding inflammatory changes. Spleen: Normal in size without focal abnormality. Adrenals/Urinary Tract: Adrenal glands are within normal limits. Kidneys demonstrate bilateral renal cysts similar to that noted on prior exam. Multiple parapelvic cysts are noted as well nonobstructing renal stone is noted in the lower pole of the left kidney stable in appearance from the prior exam. No obstructive changes are noted in the ureters. The bladder is decompressed. Stomach/Bowel: Postsurgical changes are seen in the distal colon stable in appearance from the prior exam. Patent anastomosis is noted. Small bowel is unremarkable. No gastric abnormality is seen. Vascular/Lymphatic: No significant vascular findings are present. No enlarged abdominal or pelvic lymph nodes. Small stable lymph node is noted in the right internal iliac chain similar to the prior exam. No new sizable adenopathy is seen Reproductive: Prostate is unremarkable. Other: No abdominal wall hernia or abnormality. No abdominopelvic ascites. Musculoskeletal: No acute or significant osseous findings. IMPRESSION: Changes consistent with subtotal colectomy. No acute abnormality is noted. Patchy area of increased opacity in the right posterior costophrenic angle likely  representing atelectasis or early infiltrate. Nonobstructing left renal stone stable from previous exam. Electronically Signed   By: Inez Catalina M.D.   On: 10/04/2020 16:07   DG Chest 2 View  Result Date: 10/04/2020 CLINICAL DATA:  Suspected sepsis. Vomiting and diarrhea. Syncopal episode. EXAM: CHEST - 2 VIEW COMPARISON:  04/21/2020 FINDINGS: The cardiomediastinal contours are normal. The lungs are clear. Pulmonary vasculature is normal. No consolidation, pleural effusion, or pneumothorax. No acute osseous abnormalities are seen. IMPRESSION: Negative radiographs of the chest. Electronically Signed  By: Keith Rake M.D.   On: 10/04/2020 16:01   CT Head Wo Contrast  Result Date: 10/04/2020 CLINICAL DATA:  Recent syncopal episode EXAM: CT HEAD WITHOUT CONTRAST TECHNIQUE: Contiguous axial images were obtained from the base of the skull through the vertex without intravenous contrast. COMPARISON:  None. FINDINGS: Brain: No evidence of acute infarction, hemorrhage, hydrocephalus, extra-axial collection or mass lesion/mass effect. Vascular: No hyperdense vessel or unexpected calcification. Skull: Normal. Negative for fracture or focal lesion. Sinuses/Orbits: No acute finding. Other: None. IMPRESSION: No acute intracranial abnormality noted. Electronically Signed   By: Inez Catalina M.D.   On: 10/04/2020 16:08    Scheduled Meds:  apixaban  5 mg Oral BID   simvastatin  20 mg Oral q1800    Continuous Infusions:  sodium chloride 125 mL/hr at 10/05/20 1229     LOS: 0 days     Alma Friendly, MD [facility]  If 7PM-7AM, please contact night-coverage [email or web address] 10/05/2020, 1:42 PM

## 2020-10-06 DIAGNOSIS — I959 Hypotension, unspecified: Secondary | ICD-10-CM

## 2020-10-06 LAB — BASIC METABOLIC PANEL
Anion gap: 4 — ABNORMAL LOW (ref 5–15)
Anion gap: 6 (ref 5–15)
BUN: 31 mg/dL — ABNORMAL HIGH (ref 6–20)
BUN: 29 mg/dL — ABNORMAL HIGH (ref 6–20)
CO2: 24 mmol/L (ref 22–32)
CO2: 24 mmol/L (ref 22–32)
Calcium: 8.7 mg/dL — ABNORMAL LOW (ref 8.9–10.3)
Calcium: 8.8 mg/dL — ABNORMAL LOW (ref 8.9–10.3)
Chloride: 111 mmol/L (ref 98–111)
Chloride: 107 mmol/L (ref 98–111)
Creatinine, Ser: 1.63 mg/dL — ABNORMAL HIGH (ref 0.61–1.24)
Creatinine, Ser: 1.37 mg/dL — ABNORMAL HIGH (ref 0.61–1.24)
GFR, Estimated: 50 mL/min — ABNORMAL LOW (ref >60)
GFR, Estimated: >60 mL/min (ref >60)
Glucose, Bld: 105 mg/dL — ABNORMAL HIGH (ref 70–99)
Glucose, Bld: 94 mg/dL (ref 70–99)
Potassium: 4.4 mmol/L (ref 3.5–5.1)
Potassium: 4.1 mmol/L (ref 3.5–5.1)
Sodium: 139 mmol/L (ref 135–145)
Sodium: 137 mmol/L (ref 135–145)

## 2020-10-06 NOTE — Discharge Summary (Signed)
Discharge Summary  Joel Terry YNW:[id number] DOB: 23-Dec-1965  PCP: Virginia Crews, MD  Admit date: 10/04/2020 Discharge date: 10/06/2020  Time spent: 40 mins  Recommendations for Outpatient Follow-up:  Follow-up with PCP in 1 week with repeat labs and BP check  Discharge Diagnoses:  Active Hospital Problems   Diagnosis Date Noted   AKI (acute kidney injury) ([facility]) 10/04/2020   Acute diarrhea 10/04/2020   Emesis 10/04/2020   Syncope 10/04/2020   Hypotension 10/04/2020   Hyponatremia 10/04/2020   Leukocytosis 10/04/2020   History of colon resection 10/04/2020   Single subsegmental pulmonary embolism without acute cor pulmonale ([facility]) 08/24/2020   H/O renal calculi 04/26/2015   Ulcerative colitis ([facility]) 01/13/2015   Benign essential HTN 12/16/2013   HLD (hyperlipidemia) 12/16/2013    Resolved Hospital Problems  No resolved problems to display.    Discharge Condition: Stable  Diet recommendation: Regular diet as tolerated  Vitals:   10/06/20 0822 10/06/20 1528  BP: 126/85 122/78  Pulse: 64 68  Resp: 18 20  Temp: 97.9 F (36.6 C) 98.2 F (36.8 C)  SpO2: 98% 99%    History of present illness:  Joel Terry is 55 y.o. male with PMH of HTN, HLD, ulcerative colitis with colon resection, recent PE post-operative on Eliquis who presents for diarrhea, emesis, and syncope for the past 24H. He has had 4 episodes of syncope. One was unwitnessed so unsure about if he hit his head that time. He has been having recurrent episodes of diarrhea. Did eat [place] food day prior to these events starting. Reports his wife was eating out with him and did not eat her meal due to concern about "not tasting right". Says this is very different than typical UC symptoms. Has not had episodes of syncope in past. Thinks very dehydrated as has barely urinated PTA. No known CKD.  In the ED, patient was noted to be hypotensive, tachycardic but afebrile.  Labs showed creatinine of 4.7, baseline  normal, sodium 131, all other labs fairly stable.  CT abdomen/pelvis showed nonobstructive renal stone, no obstructive changes and ureters, bladder Decompressed.  Patient was admitted for further management.    Today, patient denies any new complaints, denies any chest pain, shortness of breath, abdominal pain, nausea/vomiting, fever/chills.  Patient stable to be discharged and follow-up with primary care in 1 week.  Hospital Course:  Active Problems:   Benign essential HTN   HLD (hyperlipidemia)   Ulcerative colitis ([place])   H/O renal calculi   Single subsegmental pulmonary embolism without acute cor pulmonale ([facility])   AKI (acute kidney injury) ([facility])   Acute diarrhea   Emesis   Syncope   Hypotension   Hyponatremia   Leukocytosis   History of colon resection     Estimated body mass index is 35.73 kg/m as calculated from the following:   Height as of this encounter: 5' 8"  (1.727 m).   Weight as of this encounter: 106.6 kg.    AKI Improved Cr 4.7--> 1.37 (baseline around 0.9-1.1) Likely 2/2 dehydration from gastrointestinal losses CT abdomen shows non-obstructing renal stone, no obstructive changes in ureters, bladder decompressed S/P IV fluids, encourage adequate oral hydration Avoid nephrotoxic agents, continue to hold HCTZ, until seen by PCP, may resume lisinopril if SBP greater than 130-140 Follow-up with PCP with repeat labs in 1 week   Possible gastroenteritis In the setting of underlying h/o ulcerative colitis CT abdomen as above, unremarkable Stool study for C.diff, GI panel, negative BC X 2 NGTD  Syncope Likely 2/2 dehydration/hypotension Orthostatic vital signs unremarkable EKG showed no arrhythmias CT head neg   HTN Hypotensive at presentation, BP stable Continue to hold HCTZ until seen by PCP, may resume lisinopril if SBP greater than 130-140   History of PE on Anticoagulation Continue Eliquis   HLD Continue Simvastatin    Obesity Lifestyle  modification advised   Procedures: None  Consultations: None  Discharge Exam: BP 122/78   Pulse 68   Temp 98.2 F (36.8 C)   Resp 20   Ht 5' 8"  (1.727 m)   Wt 106.6 kg   SpO2 99%   BMI 35.73 kg/m   General: NAD  Cardiovascular: S1, S2 present Respiratory: CTAB Abdomen: Soft, nontender, nondistended, bowel sounds present Musculoskeletal: No bilateral pedal edema noted Skin: Normal Psychiatry: Normal mood    Discharge Instructions You were cared for by a hospitalist during your hospital stay. If you have any questions about your discharge medications or the care you received while you were in the hospital after you are discharged, you can call the unit and asked to speak with the hospitalist on call if the hospitalist that took care of you is not available. Once you are discharged, your primary care physician will handle any further medical issues. Please note that NO REFILLS for any discharge medications will be authorized once you are discharged, as it is imperative that you return to your primary care physician (or establish a relationship with a primary care physician if you do not have one) for your aftercare needs so that they can reassess your need for medications and monitor your lab values.  Discharge Instructions     Diet - low sodium heart healthy   Complete by: As directed    Increase activity slowly   Complete by: As directed       Allergies as of 10/06/2020       Reactions   Sulfa Antibiotics Hives   Increased HR and BP   Elemental Sulfur Rash, Other (See Comments)   Elevated BP and heart rate        Medication List     STOP taking these medications    diazepam 5 MG tablet Commonly known as: VALIUM   hydrochlorothiazide 25 MG tablet Commonly known as: HYDRODIURIL       TAKE these medications    Acetaminophen Extra Strength 500 MG tablet Generic drug: acetaminophen Take 500 mg by mouth every 8 (eight) hours as needed.   albuterol 108  (90 Base) MCG/ACT inhaler Commonly known as: VENTOLIN HFA Inhale 2 puffs into the lungs every 6 (six) hours as needed for wheezing or shortness of breath.   diclofenac Sodium 1 % Gel Commonly known as: VOLTAREN Apply 2 g topically 4 (four) times daily. For leg pain   Eliquis 5 MG Tabs tablet Generic drug: apixaban Take 1 tablet (5 mg total) by mouth 2 (two) times daily.   EPINEPHrine 0.3 mg/0.3 mL Soaj injection Commonly known as: EPI-PEN Inject 0.3 mLs (0.3 mg total) into the muscle as needed for anaphylaxis.   lisinopril 40 MG tablet Commonly known as: ZESTRIL Take 1 tablet (40 mg total) by mouth daily.   simvastatin 20 MG tablet Commonly known as: ZOCOR Take 1 tablet (20 mg total) by mouth daily at 6 PM.   SUMAtriptan 100 MG tablet Commonly known as: IMITREX Take 1 tablet (100 mg total) by mouth every 2 (two) hours as needed for migraine. May repeat in 2 hours if headache persists or recurs.  Allergies  Allergen Reactions   Sulfa Antibiotics Hives    Increased HR and BP   Elemental Sulfur Rash and Other (See Comments)    Elevated BP and heart rate    Follow-up Information     Virginia Crews, MD. Schedule an appointment as soon as possible for a visit in 1 week(s).   Specialty: Family Medicine Contact information: [address] [place] [address] [phone]                  The results of significant diagnostics from this hospitalization (including imaging, microbiology, ancillary and laboratory) are listed below for reference.    Significant Diagnostic Studies: CT ABDOMEN PELVIS WO CONTRAST  Result Date: 10/04/2020 CLINICAL DATA:  Nausea and vomiting and syncopal episodes EXAM: CT ABDOMEN AND PELVIS WITHOUT CONTRAST TECHNIQUE: Multidetector CT imaging of the abdomen and pelvis was performed following the standard protocol without IV contrast. COMPARISON:  06/02/2019 FINDINGS: Lower chest: Lung bases are well aerated with  evidence of a tiny area of parenchymal opacity in the right posterior costophrenic angle. This may represent atelectasis or early infiltrate. Hepatobiliary: No focal liver abnormality is seen. No gallstones, gallbladder wall thickening, or biliary dilatation. Pancreas: Unremarkable. No pancreatic ductal dilatation or surrounding inflammatory changes. Spleen: Normal in size without focal abnormality. Adrenals/Urinary Tract: Adrenal glands are within normal limits. Kidneys demonstrate bilateral renal cysts similar to that noted on prior exam. Multiple parapelvic cysts are noted as well nonobstructing renal stone is noted in the lower pole of the left kidney stable in appearance from the prior exam. No obstructive changes are noted in the ureters. The bladder is decompressed. Stomach/Bowel: Postsurgical changes are seen in the distal colon stable in appearance from the prior exam. Patent anastomosis is noted. Small bowel is unremarkable. No gastric abnormality is seen. Vascular/Lymphatic: No significant vascular findings are present. No enlarged abdominal or pelvic lymph nodes. Small stable lymph node is noted in the right internal iliac chain similar to the prior exam. No new sizable adenopathy is seen Reproductive: Prostate is unremarkable. Other: No abdominal wall hernia or abnormality. No abdominopelvic ascites. Musculoskeletal: No acute or significant osseous findings. IMPRESSION: Changes consistent with subtotal colectomy. No acute abnormality is noted. Patchy area of increased opacity in the right posterior costophrenic angle likely representing atelectasis or early infiltrate. Nonobstructing left renal stone stable from previous exam. Electronically Signed   By: Inez Catalina M.D.   On: 10/04/2020 16:07   DG Chest 2 View  Result Date: 10/04/2020 CLINICAL DATA:  Suspected sepsis. Vomiting and diarrhea. Syncopal episode. EXAM: CHEST - 2 VIEW COMPARISON:  04/21/2020 FINDINGS: The cardiomediastinal contours are  normal. The lungs are clear. Pulmonary vasculature is normal. No consolidation, pleural effusion, or pneumothorax. No acute osseous abnormalities are seen. IMPRESSION: Negative radiographs of the chest. Electronically Signed   By: Keith Rake M.D.   On: 10/04/2020 16:01   CT Head Wo Contrast  Result Date: 10/04/2020 CLINICAL DATA:  Recent syncopal episode EXAM: CT HEAD WITHOUT CONTRAST TECHNIQUE: Contiguous axial images were obtained from the base of the skull through the vertex without intravenous contrast. COMPARISON:  None. FINDINGS: Brain: No evidence of acute infarction, hemorrhage, hydrocephalus, extra-axial collection or mass lesion/mass effect. Vascular: No hyperdense vessel or unexpected calcification. Skull: Normal. Negative for fracture or focal lesion. Sinuses/Orbits: No acute finding. Other: None. IMPRESSION: No acute intracranial abnormality noted. Electronically Signed   By: Inez Catalina M.D.   On: 10/04/2020 16:08    Microbiology: Recent  Results (from the past 240 hour(s))  Culture, blood (Routine x 2)     Status: None (Preliminary result)   Collection Time: 10/04/20  2:51 PM   Specimen: BLOOD  Result Value Ref Range Status   Specimen Description BLOOD BLOOD RIGHT WRIST  Final   Special Requests   Final    BOTTLES DRAWN AEROBIC AND ANAEROBIC Blood Culture adequate volume   Culture   Final    NO GROWTH < 24 HOURS Performed at [facility], [address] [place], [place] [address]    Report Status PENDING  Incomplete  Resp Panel by RT-PCR (Flu A&B, Covid) Nasopharyngeal Swab     Status: None   Collection Time: 10/04/20  4:13 PM   Specimen: Nasopharyngeal Swab; Nasopharyngeal(NP) swabs in vial transport medium  Result Value Ref Range Status   SARS Coronavirus 2 by RT PCR NEGATIVE NEGATIVE Final    Comment: (NOTE) SARS-CoV-2 target nucleic acids are NOT DETECTED.  The SARS-CoV-2 RNA is generally detectable in upper respiratory specimens during the acute phase  of infection. The lowest concentration of SARS-CoV-2 viral copies this assay can detect is 138 copies/mL. A negative result does not preclude SARS-Cov-2 infection and should not be used as the sole basis for treatment or other patient management decisions. A negative result may occur with  improper specimen collection/handling, submission of specimen other than nasopharyngeal swab, presence of viral mutation(s) within the areas targeted by this assay, and inadequate number of viral copies(<138 copies/mL). A negative result must be combined with clinical observations, patient history, and epidemiological information. The expected result is Negative.  Fact Sheet for Patients:  EntrepreneurPulse.com.au  Fact Sheet for Healthcare Providers:  IncredibleEmployment.be  This test is no t yet approved or cleared by the [place] FDA and  has been authorized for detection and/or diagnosis of SARS-CoV-2 by FDA under an Emergency Use Authorization (EUA). This EUA will remain  in effect (meaning this test can be used) for the duration of the COVID-19 declaration under Section 564(b)(1) of the Act, 21 U.S.C.section 360bbb-3(b)(1), unless the authorization is terminated  or revoked sooner.       Influenza A by PCR NEGATIVE NEGATIVE Final   Influenza B by PCR NEGATIVE NEGATIVE Final    Comment: (NOTE) The Xpert Xpress SARS-CoV-2/FLU/RSV plus assay is intended as an aid in the diagnosis of influenza from Nasopharyngeal swab specimens and should not be used as a sole basis for treatment. Nasal washings and aspirates are unacceptable for Xpert Xpress SARS-CoV-2/FLU/RSV testing.  Fact Sheet for Patients: EntrepreneurPulse.com.au  Fact Sheet for Healthcare Providers: IncredibleEmployment.be  This test is not yet approved or cleared by the [place] FDA and has been authorized for detection and/or diagnosis of  SARS-CoV-2 by FDA under an Emergency Use Authorization (EUA). This EUA will remain in effect (meaning this test can be used) for the duration of the COVID-19 declaration under Section 564(b)(1) of the Act, 21 U.S.C. section 360bbb-3(b)(1), unless the authorization is terminated or revoked.  Performed at [facility], [address] [place], [place] [address]   Culture, blood (Routine x 2)     Status: None (Preliminary result)   Collection Time: 10/04/20  5:14 PM   Specimen: BLOOD  Result Value Ref Range Status   Specimen Description BLOOD BLOOD RIGHT FOREARM  Final   Special Requests   Final    BOTTLES DRAWN AEROBIC AND ANAEROBIC Blood Culture results may not be optimal due to an inadequate volume of blood received in  culture bottles   Culture   Final    NO GROWTH < 24 HOURS Performed at [facility], [address] [place], [place] [address]    Report Status PENDING  Incomplete  Gastrointestinal Panel by PCR , Stool     Status: None   Collection Time: 10/05/20 10:39 AM   Specimen: Stool  Result Value Ref Range Status   Campylobacter species NOT DETECTED NOT DETECTED Final   Plesimonas shigelloides NOT DETECTED NOT DETECTED Final   Salmonella species NOT DETECTED NOT DETECTED Final   Yersinia enterocolitica NOT DETECTED NOT DETECTED Final   Vibrio species NOT DETECTED NOT DETECTED Final   Vibrio cholerae NOT DETECTED NOT DETECTED Final   Enteroaggregative E coli (EAEC) NOT DETECTED NOT DETECTED Final   Enteropathogenic E coli (EPEC) NOT DETECTED NOT DETECTED Final   Enterotoxigenic E coli (ETEC) NOT DETECTED NOT DETECTED Final   Shiga like toxin producing E coli (STEC) NOT DETECTED NOT DETECTED Final   Shigella/Enteroinvasive E coli (EIEC) NOT DETECTED NOT DETECTED Final   Cryptosporidium NOT DETECTED NOT DETECTED Final   Cyclospora cayetanensis NOT DETECTED NOT DETECTED Final   Entamoeba histolytica NOT DETECTED NOT DETECTED Final   Giardia lamblia NOT  DETECTED NOT DETECTED Final   Adenovirus F40/41 NOT DETECTED NOT DETECTED Final   Astrovirus NOT DETECTED NOT DETECTED Final   Norovirus GI/GII NOT DETECTED NOT DETECTED Final   Rotavirus A NOT DETECTED NOT DETECTED Final   Sapovirus (I, II, IV, and V) NOT DETECTED NOT DETECTED Final    Comment: Performed at [facility], [address] [place], [place] [address]  C Difficile Quick Screen w PCR reflex     Status: None   Collection Time: 10/05/20 10:39 AM   Specimen: Stool  Result Value Ref Range Status   C Diff antigen NEGATIVE NEGATIVE Final   C Diff toxin NEGATIVE NEGATIVE Final   C Diff interpretation No C. difficile detected.  Final    Comment: Performed at [facility], [address] [place], [place] [address]     Labs: Basic Metabolic Panel: Recent Labs  Lab 10/04/20 1451 10/05/20 0456 10/06/20 0622 10/06/20 1450  NA 131* 136 139 137  K 4.1 4.0 4.4 4.1  CL 99 103 111 107  CO2 18* 27 24 24   GLUCOSE 116* 125* 105* 94  BUN 36* 41* 31* 29*  CREATININE 4.70* 3.16* 1.63* 1.37*  CALCIUM 9.9 8.8* 8.7* 8.8*   Liver Function Tests: Recent Labs  Lab 10/04/20 1451  AST 33  ALT 27  ALKPHOS 79  BILITOT 1.1  PROT 8.7*  ALBUMIN 4.7   No results for input(s): LIPASE, AMYLASE in the last 168 hours. No results for input(s): AMMONIA in the last 168 hours. CBC: Recent Labs  Lab 10/04/20 1451 10/05/20 0456  WBC 11.8* 7.5  NEUTROABS 8.9*  --   HGB 16.4 14.2  HCT 46.3 40.8  MCV 87.2 90.1  PLT 431* 334   Cardiac Enzymes: No results for input(s): CKTOTAL, CKMB, CKMBINDEX, TROPONINI in the last 168 hours. BNP: BNP (last 3 results) No results for input(s): BNP in the last 8760 hours.  ProBNP (last 3 results) No results for input(s): PROBNP in the last 8760 hours.  CBG: No results for input(s): GLUCAP in the last 168 hours.     Signed:  Alma Friendly, MD [facility] 10/06/2020, 3:34 PM

## 2020-10-06 NOTE — Plan of Care (Signed)
Continuing with plan of care. 

## 2020-10-06 NOTE — Plan of Care (Signed)
Discharge teaching completed with patient who is in stable condition. 

## 2020-10-09 ENCOUNTER — Telehealth: Payer: Self-pay

## 2020-10-09 LAB — CULTURE, BLOOD (ROUTINE X 2)
Culture: NO GROWTH
Culture: NO GROWTH
Special Requests: ADEQUATE

## 2020-10-09 NOTE — Telephone Encounter (Signed)
It looks like there were 2 different appointments made for him for tomorrow that were canceled        Copied from [place] [phone]. Topic: General - Other >> Oct 09, 2020  2:02 PM Leward Quan A wrote: Reason for CRM: Patient called in to inform Dr B that he was in the hospital due to dehydration issues say that he was informed to get an appointment for blood work just to make sure liver and kidney functions are in normal range still. No appointments available. Please advise pt can be reached at Ph# [phone]

## 2020-10-09 NOTE — Telephone Encounter (Signed)
Another appointment has been made for him tomorrow

## 2020-10-10 ENCOUNTER — Ambulatory Visit (INDEPENDENT_AMBULATORY_CARE_PROVIDER_SITE_OTHER): Payer: BC Managed Care – PPO | Admitting: Family Medicine

## 2020-10-10 ENCOUNTER — Encounter: Payer: Self-pay | Admitting: Family Medicine

## 2020-10-10 ENCOUNTER — Ambulatory Visit: Payer: Self-pay | Admitting: Family Medicine

## 2020-10-10 ENCOUNTER — Other Ambulatory Visit: Payer: Self-pay

## 2020-10-10 VITALS — BP 120/79 | HR 92 | Temp 96.1°F | Wt 240.0 lb

## 2020-10-10 DIAGNOSIS — I10 Essential (primary) hypertension: Secondary | ICD-10-CM

## 2020-10-10 DIAGNOSIS — E871 Hypo-osmolality and hyponatremia: Secondary | ICD-10-CM

## 2020-10-10 DIAGNOSIS — N179 Acute kidney failure, unspecified: Secondary | ICD-10-CM | POA: Diagnosis not present

## 2020-10-10 NOTE — Progress Notes (Signed)
Established patient visit   Patient: Joel Terry   DOB: 1966/01/30   55 y.o. Male  MRN: [id number] Visit Date: 10/10/2020  Today's healthcare provider: Vernie Murders, PA-C   No chief complaint on file.  Subjective    HPI  Follow up Hospitalization  Patient was admitted to [facility] on 10/04/2020 and discharged on 10/06/2020. He was treated for; AKI (acute kidney injury) Treatment for this included; see notes in chart. Telephone follow up was done on none He reports excellent compliance with treatment. He reports this condition is improved.  But he is still feeling "a little weak".   --------------------------------------------------------------------------------------- Past Medical History:  Diagnosis Date   Asthma    Chronic kidney disease    Headache    History of kidney stones    Hypertension    Nephrolithiasis    Obesity    Ulcerative colitis ([place])    Past Surgical History:  Procedure Laterality Date   COLECTOMY     COLONOSCOPY WITH PROPOFOL N/A 09/16/2014   Procedure: COLONOSCOPY WITH PROPOFOL;  Surgeon: Lollie Sails, MD;  Location: [facility] ENDOSCOPY;  Service: Endoscopy;  Laterality: N/A;   FLEXIBLE SIGMOIDOSCOPY N/A 07/11/2017   Procedure: FLEXIBLE SIGMOIDOSCOPY;  Surgeon: Lollie Sails, MD;  Location: [facility] ENDOSCOPY;  Service: Endoscopy;  Laterality: N/A;   FLEXIBLE SIGMOIDOSCOPY N/A 10/23/2017   Procedure: FLEXIBLE SIGMOIDOSCOPY;  Surgeon: Lollie Sails, MD;  Location: [facility] ENDOSCOPY;  Service: Endoscopy;  Laterality: N/A;   FLEXIBLE SIGMOIDOSCOPY N/A 08/06/2019   Procedure: FLEXIBLE SIGMOIDOSCOPY;  Surgeon: Jonathon Bellows, MD;  Location: [facility] ENDOSCOPY;  Service: Gastroenterology;  Laterality: N/A;   HERNIA REPAIR     No family history on file. Social History   Tobacco Use   Smoking status: Never   Smokeless tobacco: Never  Vaping Use   Vaping Use: Never used  Substance Use Topics   Alcohol use: Yes    Alcohol/week: 3.0 standard drinks     Types: 3 Cans of beer per week    Comment: Occassional   Drug use: No   Allergies  Allergen Reactions   Sulfa Antibiotics Hives    Increased HR and BP   Elemental Sulfur Rash and Other (See Comments)    Elevated BP and heart rate    Medications: Outpatient Medications Prior to Visit  Medication Sig   albuterol (VENTOLIN HFA) 108 (90 Base) MCG/ACT inhaler Inhale 2 puffs into the lungs every 6 (six) hours as needed for wheezing or shortness of breath.   apixaban (ELIQUIS) 5 MG TABS tablet Take 1 tablet (5 mg total) by mouth 2 (two) times daily.   diclofenac Sodium (VOLTAREN) 1 % GEL Apply 2 g topically 4 (four) times daily. For leg pain   EPINEPHrine 0.3 mg/0.3 mL IJ SOAJ injection Inject 0.3 mLs (0.3 mg total) into the muscle as needed for anaphylaxis.   lisinopril (ZESTRIL) 40 MG tablet Take 1 tablet (40 mg total) by mouth daily.   simvastatin (ZOCOR) 20 MG tablet Take 1 tablet (20 mg total) by mouth daily at 6 PM.   SUMAtriptan (IMITREX) 100 MG tablet Take 1 tablet (100 mg total) by mouth every 2 (two) hours as needed for migraine. May repeat in 2 hours if headache persists or recurs.   ACETAMINOPHEN EXTRA STRENGTH 500 MG tablet Take 500 mg by mouth every 8 (eight) hours as needed. (Patient not taking: No sig reported)   No facility-administered medications prior to visit.    Review of Systems  Constitutional:  Positive for fatigue. Negative for appetite change, chills and fever.  Respiratory:  Positive for cough. Negative for apnea, choking, chest tightness, shortness of breath, wheezing and stridor.   Cardiovascular: Negative.  Negative for chest pain and palpitations.  Gastrointestinal: Negative.  Negative for abdominal pain, nausea and vomiting.  Neurological:  Positive for light-headedness. Negative for dizziness and headaches.      Objective    BP 120/79 (BP Location: Right Arm, Patient Position: Sitting, Cuff Size: Large)   Pulse 92   Temp (!) 96.1 F (35.6 C)  (Temporal)   Wt 240 lb (108.9 kg)   SpO2 97%   BMI 36.49 kg/m  Wt Readings from Last 3 Encounters:  10/10/20 240 lb (108.9 kg)  10/04/20 235 lb (106.6 kg)  08/24/20 236 lb 11.2 oz (107.4 kg)   BP Readings from Last 3 Encounters:  10/10/20 120/79  10/06/20 122/78  08/24/20 110/87     Physical Exam Constitutional:      General: He is not in acute distress.    Appearance: He is well-developed.  HENT:     Head: Normocephalic and atraumatic.     Right Ear: Hearing normal.     Left Ear: Hearing normal.     Nose: Nose normal.  Eyes:     General: Lids are normal. No scleral icterus.       Right eye: No discharge.        Left eye: No discharge.     Conjunctiva/sclera: Conjunctivae normal.  Cardiovascular:     Rate and Rhythm: Normal rate and regular rhythm.     Pulses: Normal pulses.     Heart sounds: Normal heart sounds.  Pulmonary:     Effort: Pulmonary effort is normal. No respiratory distress.     Breath sounds: Normal breath sounds.  Abdominal:     General: Bowel sounds are normal.     Palpations: Abdomen is soft.  Musculoskeletal:        General: Normal range of motion.     Cervical back: Neck supple.  Skin:    Findings: No lesion or rash.  Neurological:     Mental Status: He is alert and oriented to person, place, and time.  Psychiatric:        Speech: Speech normal.        Behavior: Behavior normal.        Thought Content: Thought content normal.      No results found for any visits on 10/10/20.  Assessment & Plan     1. AKI (acute kidney injury) ([place]) Hospitalized 10-04-20 to 10-06-20 with dehydration secondary to diarrhea, emesis and syncope. Has a history of ulcerative colitis with colon resection and recent PE post-op lumbar laminectomy (presently on Eliquis). Creatinine elevated to 4.7 with GFR 14 on 10-04-20. No further GI upset and regaining strength. Continue better hydration and recheck labs. - CBC with Differential/Platelet - Comprehensive metabolic  panel  2. Hyponatremia History of sodium 131 during hospitalization. Was back up to 139 at discharge. Recheck levels. Slowly regaining energy level. - CBC with Differential/Platelet - Comprehensive metabolic panel  3. Benign essential HTN Well controlled on Lisinopril 40 mg qd. Stopped the HCTZ during hospitalization for AKI with dehydration. - CBC with Differential/Platelet - Comprehensive metabolic panel   No follow-ups on file.     I, Lorrin Nawrot, PA-C, have reviewed all documentation for this visit. The documentation on 10/10/20 for the exam, diagnosis, procedures, and orders are all accurate and complete.    Simona Huh  [place], [place] [phone] (phone) [phone] (fax)  [place]

## 2020-10-11 LAB — CBC WITH DIFFERENTIAL/PLATELET
Basophils Absolute: 0.1 10*3/uL (ref 0.0–0.2)
Basos: 1 %
EOS (ABSOLUTE): 0.1 10*3/uL (ref 0.0–0.4)
Eos: 2 %
Hematocrit: 43.7 % (ref 37.5–51.0)
Hemoglobin: 14.9 g/dL (ref 13.0–17.7)
Immature Grans (Abs): 0 10*3/uL (ref 0.0–0.1)
Immature Granulocytes: 0 %
Lymphocytes Absolute: 1.6 10*3/uL (ref 0.7–3.1)
Lymphs: 20 %
MCH: 31.4 pg (ref 26.6–33.0)
MCHC: 34.1 g/dL (ref 31.5–35.7)
MCV: 92 fL (ref 79–97)
Monocytes Absolute: 1 10*3/uL — ABNORMAL HIGH (ref 0.1–0.9)
Monocytes: 12 %
Neutrophils Absolute: 5.1 10*3/uL (ref 1.4–7.0)
Neutrophils: 65 %
Platelets: 432 10*3/uL (ref 150–450)
RBC: 4.75 x10E6/uL (ref 4.14–5.80)
RDW: 13.3 % (ref 11.6–15.4)
WBC: 7.8 10*3/uL (ref 3.4–10.8)

## 2020-10-11 LAB — COMPREHENSIVE METABOLIC PANEL
ALT: 27 IU/L (ref 0–44)
AST: 21 IU/L (ref 0–40)
Albumin/Globulin Ratio: 1.6 (ref 1.2–2.2)
Albumin: 4.2 g/dL (ref 3.8–4.9)
Alkaline Phosphatase: 85 IU/L (ref 44–121)
BUN/Creatinine Ratio: 13 (ref 9–20)
BUN: 16 mg/dL (ref 6–24)
Bilirubin Total: 0.4 mg/dL (ref 0.0–1.2)
CO2: 22 mmol/L (ref 20–29)
Calcium: 10.3 mg/dL — ABNORMAL HIGH (ref 8.7–10.2)
Chloride: 103 mmol/L (ref 96–106)
Creatinine, Ser: 1.27 mg/dL (ref 0.76–1.27)
Globulin, Total: 2.6 g/dL (ref 1.5–4.5)
Glucose: 79 mg/dL (ref 65–99)
Potassium: 5 mmol/L (ref 3.5–5.2)
Sodium: 141 mmol/L (ref 134–144)
Total Protein: 6.8 g/dL (ref 6.0–8.5)
eGFR: 68 mL/min/{1.73_m2} (ref >59)

## 2020-10-12 ENCOUNTER — Other Ambulatory Visit: Payer: Self-pay | Admitting: Family Medicine

## 2020-10-12 DIAGNOSIS — I10 Essential (primary) hypertension: Secondary | ICD-10-CM

## 2020-10-12 NOTE — Telephone Encounter (Signed)
[facility] faxed refill request for the following medication   lisinopril (ZESTRIL) 40 MG tablet   hydrochlorothiazide (HYDRODIURIL) 25 MG tablet   Above medication is not on current medication list   Please advise.

## 2020-10-13 MED ORDER — LISINOPRIL 40 MG PO TABS: 40.0000 mg | ORAL_TABLET | Freq: Every day | ORAL | 0 refills | Status: DC

## 2020-12-12 ENCOUNTER — Inpatient Hospital Stay: Payer: BC Managed Care – PPO | Admitting: Family Medicine

## 2021-01-17 ENCOUNTER — Telehealth: Payer: Self-pay | Admitting: Family Medicine

## 2021-01-17 DIAGNOSIS — E7800 Pure hypercholesterolemia, unspecified: Secondary | ICD-10-CM

## 2021-01-17 MED ORDER — SIMVASTATIN 20 MG PO TABS: 20.0000 mg | ORAL_TABLET | Freq: Every day | ORAL | 0 refills | Status: DC

## 2021-01-17 NOTE — Telephone Encounter (Signed)
[facility] faxed refill request for the following medications:   simvastatin (ZOCOR) 20 MG tablet   Please advise.

## 2021-01-30 ENCOUNTER — Other Ambulatory Visit: Payer: Self-pay | Admitting: Family Medicine

## 2021-01-30 DIAGNOSIS — I10 Essential (primary) hypertension: Secondary | ICD-10-CM

## 2021-01-31 MED ORDER — LISINOPRIL 40 MG PO TABS: 40.0000 mg | ORAL_TABLET | Freq: Every day | ORAL | 0 refills | Status: DC

## 2021-02-12 ENCOUNTER — Telehealth: Payer: Self-pay | Admitting: Family Medicine

## 2021-02-12 NOTE — Telephone Encounter (Signed)
[facility] faxed refill request for the following medications:   albuterol (VENTOLIN HFA) 108 (90 Base) MCG/ACT inhaler   Please advise.

## 2021-02-13 MED ORDER — ALBUTEROL SULFATE HFA 108 (90 BASE) MCG/ACT IN AERS: 2.0000 | INHALATION_SPRAY | Freq: Four times a day (QID) | RESPIRATORY_TRACT | 0 refills | Status: DC | PRN

## 2021-02-26 ENCOUNTER — Other Ambulatory Visit: Payer: Self-pay

## 2021-02-26 ENCOUNTER — Ambulatory Visit (INDEPENDENT_AMBULATORY_CARE_PROVIDER_SITE_OTHER): Payer: BC Managed Care – PPO | Admitting: Family Medicine

## 2021-02-26 ENCOUNTER — Encounter: Payer: Self-pay | Admitting: Family Medicine

## 2021-02-26 VITALS — BP 126/89 | HR 82 | Temp 97.9°F | Resp 16 | Wt 230.0 lb

## 2021-02-26 DIAGNOSIS — Z23 Encounter for immunization: Secondary | ICD-10-CM | POA: Diagnosis not present

## 2021-02-26 DIAGNOSIS — G4700 Insomnia, unspecified: Secondary | ICD-10-CM

## 2021-02-26 DIAGNOSIS — Z1159 Encounter for screening for other viral diseases: Secondary | ICD-10-CM

## 2021-02-26 DIAGNOSIS — I10 Essential (primary) hypertension: Secondary | ICD-10-CM | POA: Diagnosis not present

## 2021-02-26 DIAGNOSIS — E7800 Pure hypercholesterolemia, unspecified: Secondary | ICD-10-CM | POA: Diagnosis not present

## 2021-02-26 DIAGNOSIS — R5383 Other fatigue: Secondary | ICD-10-CM | POA: Diagnosis not present

## 2021-02-26 DIAGNOSIS — R531 Weakness: Secondary | ICD-10-CM | POA: Insufficient documentation

## 2021-02-26 MED ORDER — SIMVASTATIN 20 MG PO TABS: 20.0000 mg | ORAL_TABLET | Freq: Every day | ORAL | 3 refills | Status: DC

## 2021-02-26 MED ORDER — LISINOPRIL 40 MG PO TABS: 40.0000 mg | ORAL_TABLET | Freq: Every day | ORAL | 3 refills | Status: DC

## 2021-02-26 NOTE — Progress Notes (Signed)
Established patient visit   Patient: Joel Terry   DOB: 1965/07/04   55 y.o. Male  MRN: [id number] Visit Date: 02/26/2021  Today's healthcare provider: Lavon Paganini, MD   Chief Complaint  Patient presents with   Hypertension   pulmonary embolism   Insomnia   Subjective    Fatigue started after back surgery in April. He reports "not feeling healthy". Takes more effort to do activities Tolerating medications well Has had chronic insomnia for years. No issues falling asleep, trouble staying asleep and getting back to sleep  Medications: Outpatient Medications Prior to Visit  Medication Sig   albuterol (VENTOLIN HFA) 108 (90 Base) MCG/ACT inhaler Inhale 2 puffs into the lungs every 6 (six) hours as needed for wheezing or shortness of breath.   apixaban (ELIQUIS) 5 MG TABS tablet Take 1 tablet (5 mg total) by mouth 2 (two) times daily.   EPINEPHrine 0.3 mg/0.3 mL IJ SOAJ injection Inject 0.3 mLs (0.3 mg total) into the muscle as needed for anaphylaxis.   SUMAtriptan (IMITREX) 100 MG tablet Take 1 tablet (100 mg total) by mouth every 2 (two) hours as needed for migraine. May repeat in 2 hours if headache persists or recurs.   [DISCONTINUED] lisinopril (ZESTRIL) 40 MG tablet Take 1 tablet (40 mg total) by mouth daily.   [DISCONTINUED] simvastatin (ZOCOR) 20 MG tablet Take 1 tablet (20 mg total) by mouth daily at 6 PM.   [DISCONTINUED] ACETAMINOPHEN EXTRA STRENGTH 500 MG tablet Take 500 mg by mouth every 8 (eight) hours as needed.   [DISCONTINUED] diclofenac Sodium (VOLTAREN) 1 % GEL Apply 2 g topically 4 (four) times daily. For leg pain   No facility-administered medications prior to visit.    Review of Systems  Constitutional:  Positive for fatigue.  HENT: Negative.    Respiratory: Negative.    Cardiovascular: Negative.   Gastrointestinal: Negative.   Endocrine: Negative.   Musculoskeletal: Negative.   Skin: Negative.   Neurological:  Negative for light-headedness  and headaches.  Hematological: Negative.   Psychiatric/Behavioral: Negative.        Objective    BP 126/89 (BP Location: Left Arm, Patient Position: Sitting, Cuff Size: Large)   Pulse 82   Temp 97.9 F (36.6 C)   Resp 16   Wt 230 lb (104.3 kg)   SpO2 96%   BMI 34.97 kg/m  {Show previous vital signs (optional):23777}  Physical Exam Vitals reviewed.  Constitutional:      General: He is not in acute distress.    Appearance: Normal appearance. He is not ill-appearing or toxic-appearing.  HENT:     Head: Normocephalic and atraumatic.     Right Ear: External ear normal.     Left Ear: External ear normal.     Nose: Nose normal.     Mouth/Throat:     Mouth: Mucous membranes are moist.     Pharynx: Oropharynx is clear. No posterior oropharyngeal erythema.  Eyes:     General: No scleral icterus.    Extraocular Movements: Extraocular movements intact.     Conjunctiva/sclera: Conjunctivae normal.     Pupils: Pupils are equal, round, and reactive to light.  Cardiovascular:     Rate and Rhythm: Normal rate and regular rhythm.     Pulses: Normal pulses.     Heart sounds: Normal heart sounds. No murmur heard.   No friction rub. No gallop.  Pulmonary:     Effort: Pulmonary effort is normal. No respiratory distress.  Breath sounds: Normal breath sounds. No wheezing, rhonchi or rales.  Abdominal:     General: Abdomen is flat. Bowel sounds are normal. There is no distension.     Palpations: Abdomen is soft.     Tenderness: There is no abdominal tenderness.  Musculoskeletal:        General: Normal range of motion.     Cervical back: Normal range of motion and neck supple.  Skin:    General: Skin is warm and dry.     Capillary Refill: Capillary refill takes less than 2 seconds.     Findings: No lesion or rash.  Neurological:     General: No focal deficit present.     Mental Status: He is alert and oriented to person, place, and time.  Psychiatric:        Mood and Affect: Mood  normal.        Behavior: Behavior normal.     No results found for any visits on 02/26/21.  Assessment & Plan     Problem List Items Addressed This Visit       Cardiovascular and Mediastinum   Benign essential HTN - Primary    Well controlled Continue lisinopril 448m Recheck CMP      Relevant Medications   lisinopril (ZESTRIL) 40 MG tablet   simvastatin (ZOCOR) 20 MG tablet   Other Relevant Orders   Comprehensive metabolic panel     Other   HLD (hyperlipidemia)    Chronic, stable Continue simvastatin 242mCheck lipid panel      Relevant Medications   lisinopril (ZESTRIL) 40 MG tablet   simvastatin (ZOCOR) 20 MG tablet   Other Relevant Orders   Lipid panel   Comprehensive metabolic panel   Fatigue    Chronic, onset after surgery  Likely due to recovery and deconditioning after surgery Differential includes anemia, absorption issues, thyroid disease History of colon resection Check CBC, CMP, Vit D, B12, TSH      Relevant Orders   TSH   CBC   Comprehensive metabolic panel   VITAMIN D 25 Hydroxy (Vit-D Deficiency, Fractures)   B12   Insomnia    Longstanding Failed trazodone and Lunesta Discussed risks/benefits of Ambien and other sleep aids Patient decides to forgo at this time as he did experience cognitive slowing on Lunesta in the past      Other Visit Diagnoses     Need for shingles vaccine       Relevant Orders   Varicella-zoster vaccine IM   Need for influenza vaccination       Relevant Orders   Flu Vaccine QUAD 48m64mo (Fluarix, Fluzone & Alfiuria Quad PF)   Need for hepatitis C screening test       Relevant Orders   Hepatitis C Antibody   Essential hypertension       Relevant Medications   lisinopril (ZESTRIL) 40 MG tablet   simvastatin (ZOCOR) 20 MG tablet        Return in about 6 months (around 08/26/2021) for CPE, With new PCP.      EllNelva Nayedical Student 02/27/2021, 8:42 AM    Patient seen along with MS3 student EllNelva Nay personally evaluated this patient along with the student, and verified all aspects of the history, physical exam, and medical decision making as documented by the student. I agree with the student's documentation and have made all necessary edits.  Michall Noffke, AngDionne BucyD, MPH [place]

## 2021-02-26 NOTE — Assessment & Plan Note (Addendum)
Well controlled Continue lisinopril 19m Recheck CMP

## 2021-02-26 NOTE — Assessment & Plan Note (Signed)
Chronic, stable Continue simvastatin 40m Check lipid panel

## 2021-02-26 NOTE — Assessment & Plan Note (Addendum)
Chronic, onset after surgery  Likely due to recovery and deconditioning after surgery Differential includes anemia, absorption issues, thyroid disease History of colon resection Check CBC, CMP, Vit D, B12, TSH

## 2021-02-27 ENCOUNTER — Telehealth: Payer: Self-pay | Admitting: Physician Assistant

## 2021-02-27 DIAGNOSIS — Z6834 Body mass index (BMI) 34.0-34.9, adult: Secondary | ICD-10-CM | POA: Diagnosis not present

## 2021-02-27 DIAGNOSIS — K5180 Other ulcerative colitis without complications: Secondary | ICD-10-CM | POA: Diagnosis not present

## 2021-02-27 DIAGNOSIS — I2699 Other pulmonary embolism without acute cor pulmonale: Secondary | ICD-10-CM | POA: Diagnosis not present

## 2021-02-27 DIAGNOSIS — M5416 Radiculopathy, lumbar region: Secondary | ICD-10-CM | POA: Diagnosis not present

## 2021-02-27 DIAGNOSIS — G4700 Insomnia, unspecified: Secondary | ICD-10-CM | POA: Insufficient documentation

## 2021-02-27 DIAGNOSIS — Z86711 Personal history of pulmonary embolism: Secondary | ICD-10-CM | POA: Diagnosis not present

## 2021-02-27 NOTE — Assessment & Plan Note (Signed)
Longstanding Failed trazodone and Lunesta Discussed risks/benefits of Ambien and other sleep aids Patient decides to forgo at this time as he did experience cognitive slowing on Lunesta in the past

## 2021-02-27 NOTE — Telephone Encounter (Signed)
[facility] faxed refill request for the following medications:   apixaban (ELIQUIS) 5 MG TABS tablet   Please advise.

## 2021-02-28 ENCOUNTER — Other Ambulatory Visit: Payer: Self-pay

## 2021-02-28 MED ORDER — APIXABAN 5 MG PO TABS: 5.0000 mg | ORAL_TABLET | Freq: Two times a day (BID) | ORAL | 4 refills | Status: DC

## 2021-03-09 ENCOUNTER — Other Ambulatory Visit: Payer: Self-pay | Admitting: Family Medicine

## 2021-03-09 NOTE — Telephone Encounter (Signed)
Requested Prescriptions  Pending Prescriptions Disp Refills  . albuterol (VENTOLIN HFA) 108 (90 Base) MCG/ACT inhaler [Pharmacy Med Name: ALBUTEROL HFA INH(200 PUFFS)18GM] 18 g 0    Sig: INHALE 2 PUFFS INTO THE LUNGS EVERY 6 HOURS AS NEEDED FOR WHEEZING OR SHORTNESS OF BREATH     Pulmonology:  Beta Agonists Failed - 03/09/2021  3:33 AM      Failed - One inhaler should last at least one month. If the patient is requesting refills earlier, contact the patient to check for uncontrolled symptoms.      Passed - Valid encounter within last 12 months    Recent Outpatient Visits          1 week ago Benign essential HTN   [facility] [place], Dionne Bucy, MD   5 months ago AKI (acute kidney injury) [facility])   [place], Vickki Muff, PA-C   6 months ago Single subsegmental pulmonary embolism without [facility])   [facility] [place], Dionne Bucy, MD   7 months ago Preoperative clearance   [facility] Jerrol Banana., MD   8 months ago Hyperlipidemia, unspecified hyperlipidemia type   [place], Kelby Aline, FNP      Future Appointments            In 5 months Thedore Mins, Ria Comment, PA-C [employer], [place]

## 2021-03-09 NOTE — Telephone Encounter (Signed)
Re routing to PCP- previously sent to different provider

## 2021-04-25 ENCOUNTER — Other Ambulatory Visit: Payer: Self-pay | Admitting: Family Medicine

## 2021-04-25 DIAGNOSIS — E7800 Pure hypercholesterolemia, unspecified: Secondary | ICD-10-CM

## 2021-06-25 IMAGING — CT CT ABD-PELV W/O CM
2 of 4 series · 16 of 46 positions shown, 18 images · non-contrast
Comparison: 06/02/2019

CLINICAL DATA: Nausea and vomiting and syncopal episodes

EXAM:
CT ABDOMEN AND PELVIS WITHOUT CONTRAST
TECHNIQUE: Multidetector CT imaging of the abdomen and pelvis was performed
following the standard protocol without IV contrast.

[Series 2: routine abd/pel wo · axial · 0.96mm/px · z∈[-485,-45]mm · 13 of 98 slices shown, 15 images]
[im 5/98  soft-tissue]
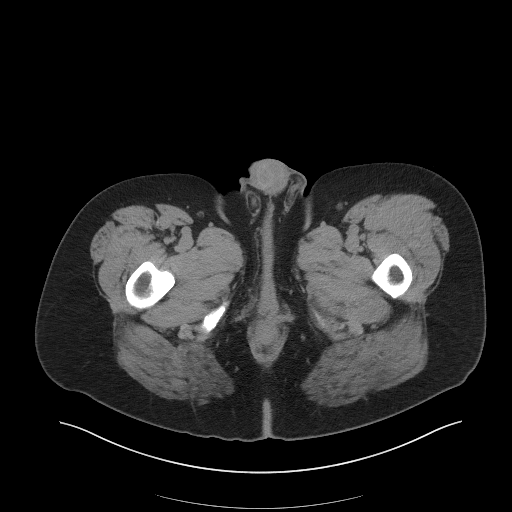
[im 5/98  bone]
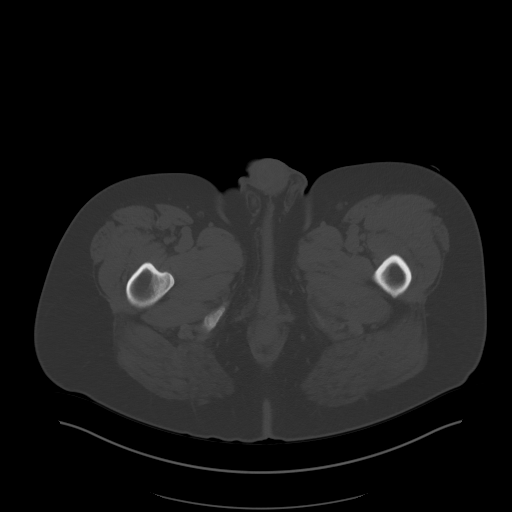
[im 13/98  soft-tissue]
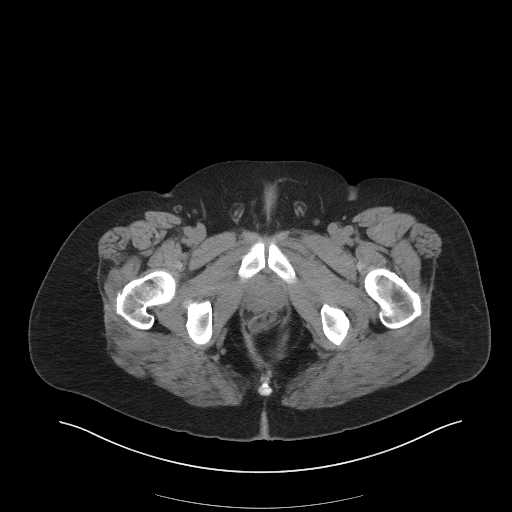
[im 22/98  soft-tissue]
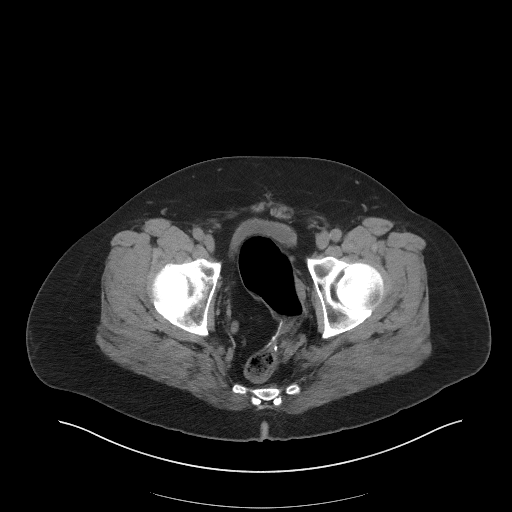
[im 26/98  soft-tissue]
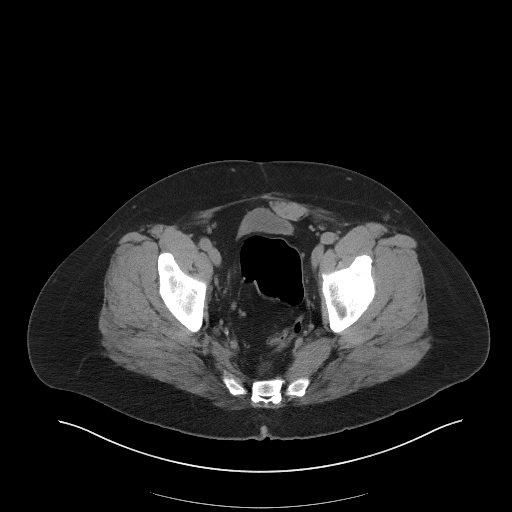
[im 34/98  soft-tissue]
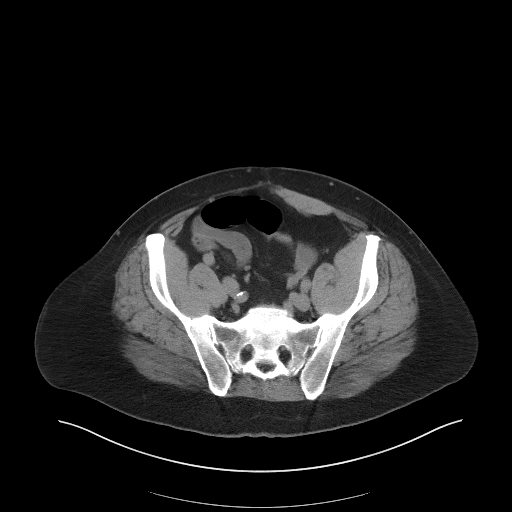
[im 43/98  soft-tissue]
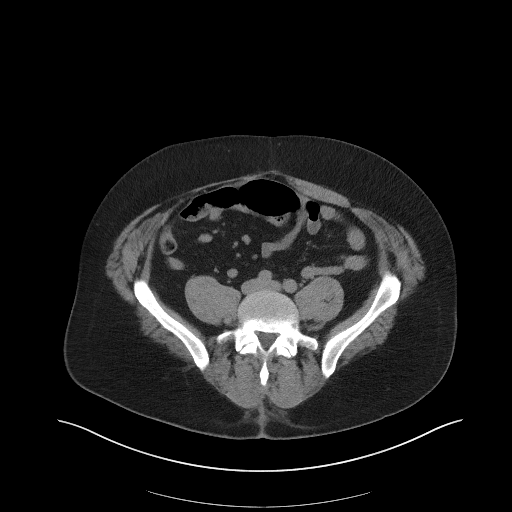
[im 51/98  soft-tissue]
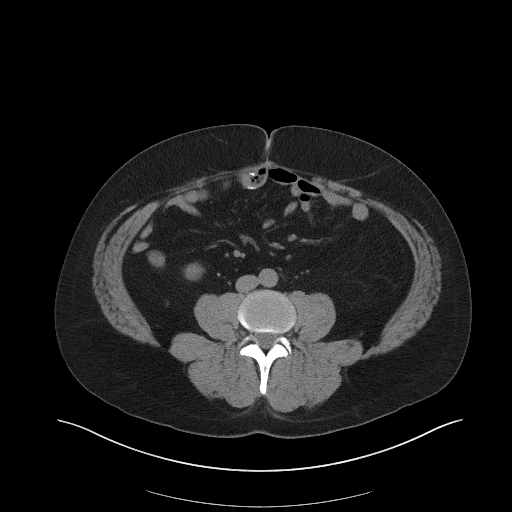
[im 55/98  soft-tissue]
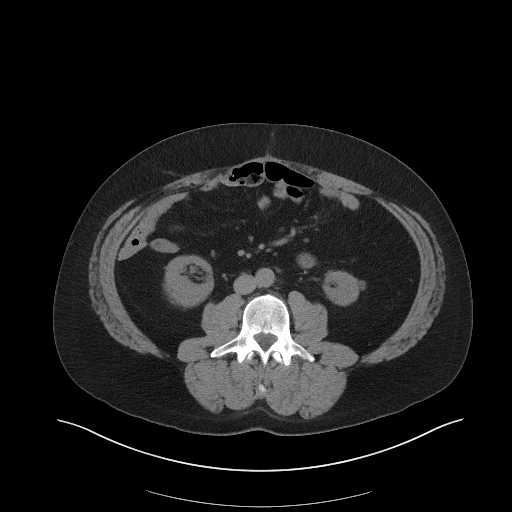
[im 64/98  soft-tissue]
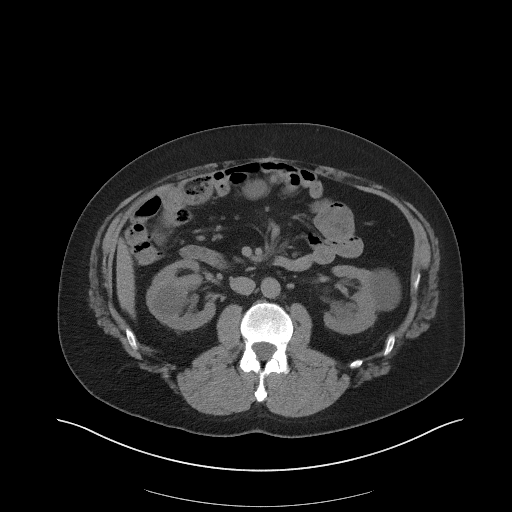
[im 64/98  bone]
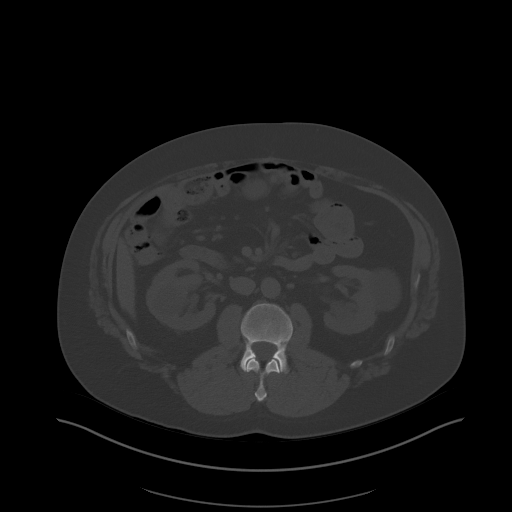
[im 72/98  soft-tissue]
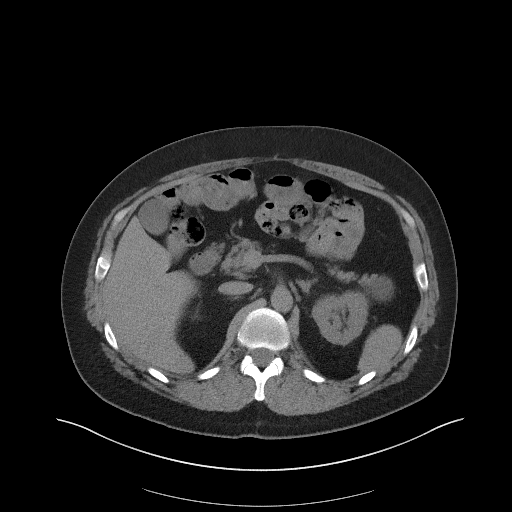
[im 76/98  soft-tissue]
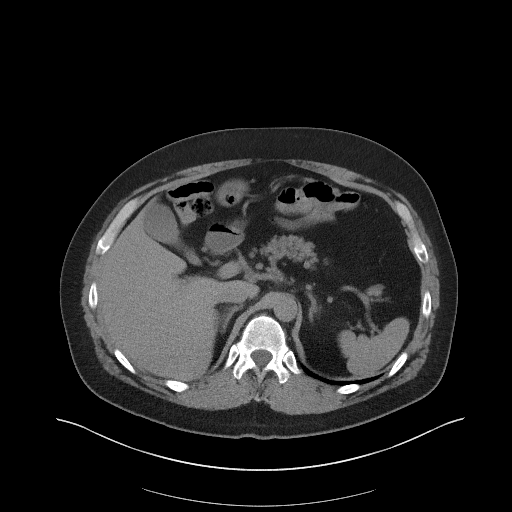
[im 85/98  soft-tissue]
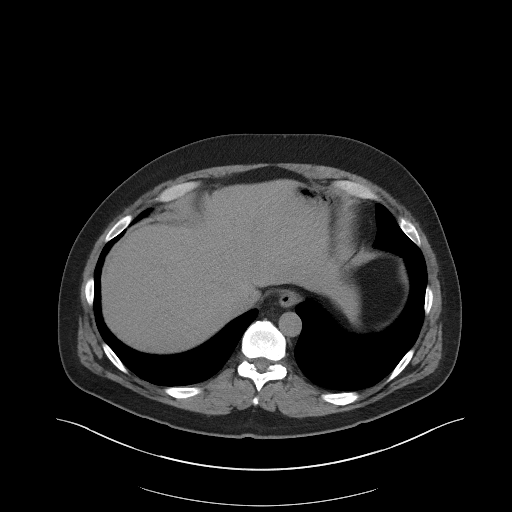
[im 93/98  soft-tissue]
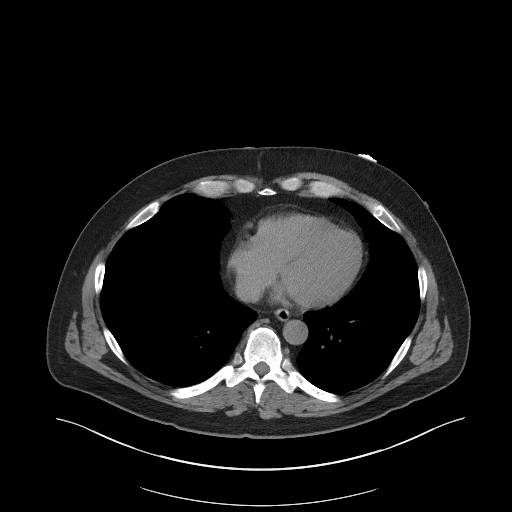

[Series 5: coronal st · coronal · 0.87mm/px · 3 of 103 slices shown]
[im 35/103  soft-tissue]
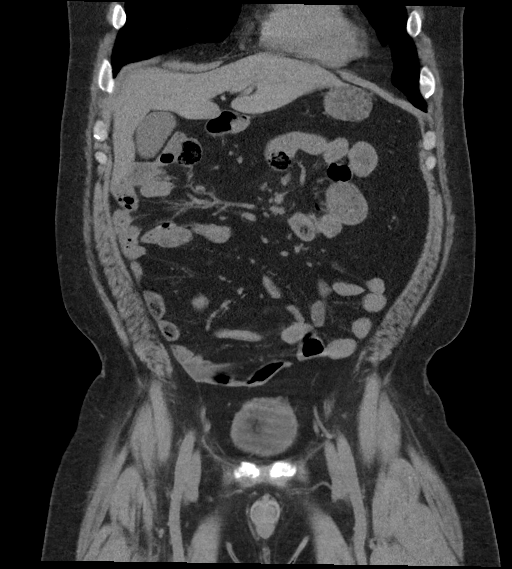
[im 46/103  soft-tissue]
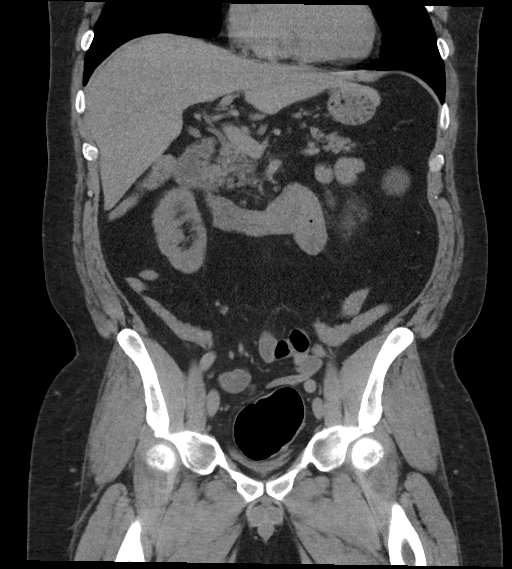
[im 57/103  soft-tissue]
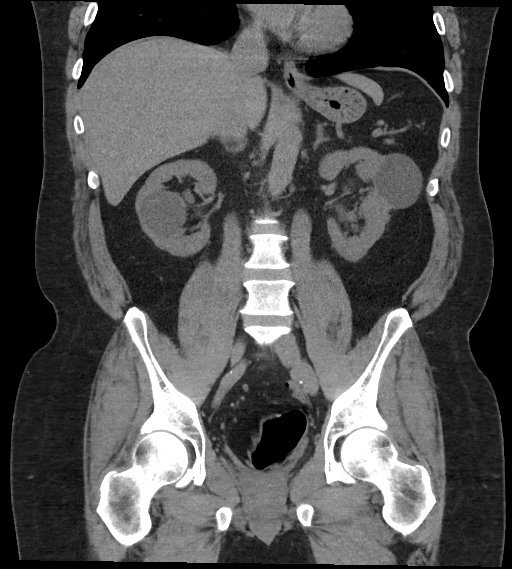

[16 of 46 positions shown; findings below may reference images not displayed]

FINDINGS: Lower chest: Lung bases are well aerated with evidence of a tiny
area of parenchymal opacity in the right posterior costophrenic
angle. This may represent atelectasis or early infiltrate.

Hepatobiliary: No focal liver abnormality is seen. No gallstones,
gallbladder wall thickening, or biliary dilatation.

Pancreas: Unremarkable. No pancreatic ductal dilatation or
surrounding inflammatory changes.

Spleen: Normal in size without focal abnormality.

Adrenals/Urinary Tract: Adrenal glands are within normal limits.
Kidneys demonstrate bilateral renal cysts similar to that noted on
prior exam. Multiple parapelvic cysts are noted as well
nonobstructing renal stone is noted in the lower pole of the left
kidney stable in appearance from the prior exam. No obstructive
changes are noted in the ureters. The bladder is decompressed.

Stomach/Bowel: Postsurgical changes are seen in the distal colon
stable in appearance from the prior exam. Patent anastomosis is
noted. Small bowel is unremarkable. No gastric abnormality is seen.

Vascular/Lymphatic: No significant vascular findings are present. No
enlarged abdominal or pelvic lymph nodes. Small stable lymph node is
noted in the right internal iliac chain similar to the prior exam.
No new sizable adenopathy is seen

Reproductive: Prostate is unremarkable.

Other: No abdominal wall hernia or abnormality. No abdominopelvic
ascites.

Musculoskeletal: No acute or significant osseous findings.
IMPRESSION: Changes consistent with subtotal colectomy. No acute abnormality is
noted.

Patchy area of increased opacity in the right posterior costophrenic
angle likely representing atelectasis or early infiltrate.

Nonobstructing left renal stone stable from previous exam.

## 2021-06-25 IMAGING — CT CT HEAD W/O CM
3 series · 16 of 47 positions shown, 19 images · non-contrast
Comparison: None.

CLINICAL DATA: Recent syncopal episode

EXAM:
CT HEAD WITHOUT CONTRAST
TECHNIQUE: Contiguous axial images were obtained from the base of the skull
through the vertex without intravenous contrast.

[Series 3: head wo · axial · 0.44mm/px · z∈[+292,+422]mm · 10 of 32 slices shown, 13 images]
[im 3/32  brain]
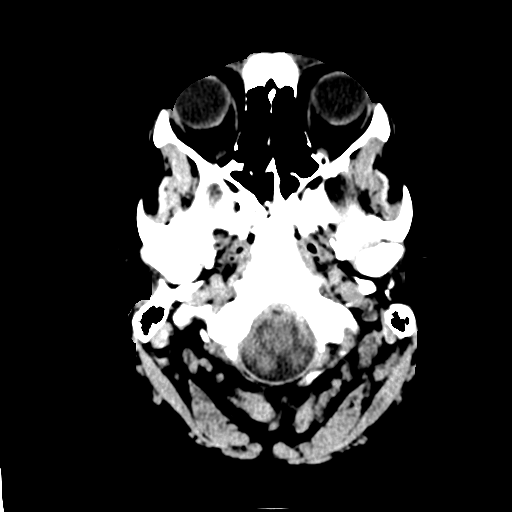
[im 3/32  bone]
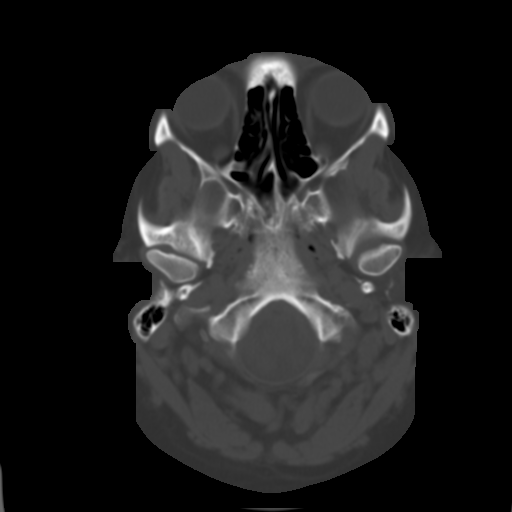
[im 6/32  brain]
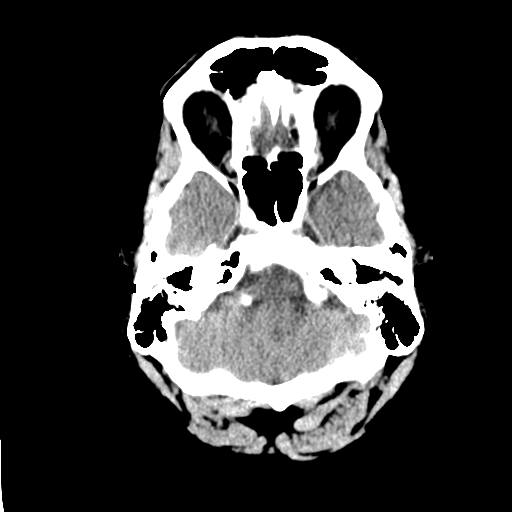
[im 9/32  brain]
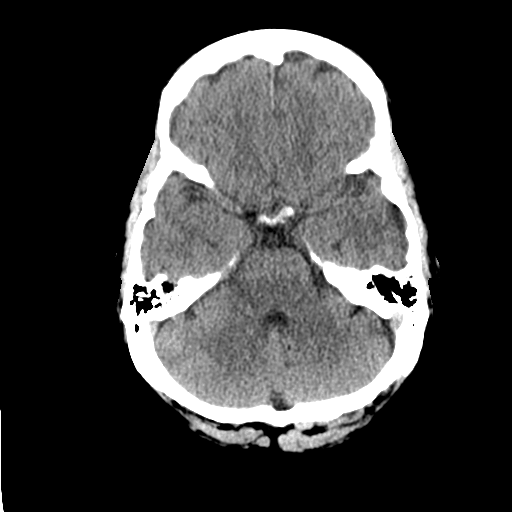
[im 11/32  brain]
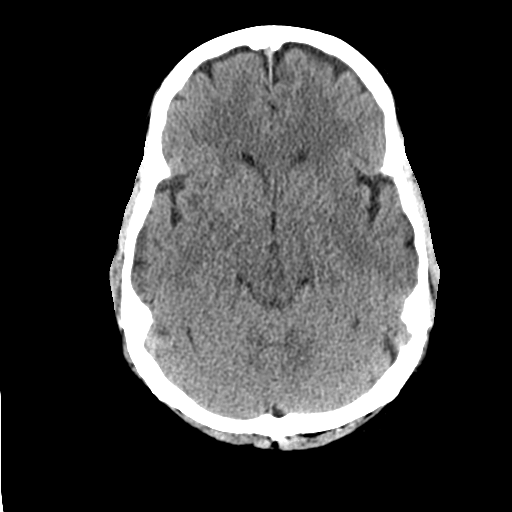
[im 14/32  brain]
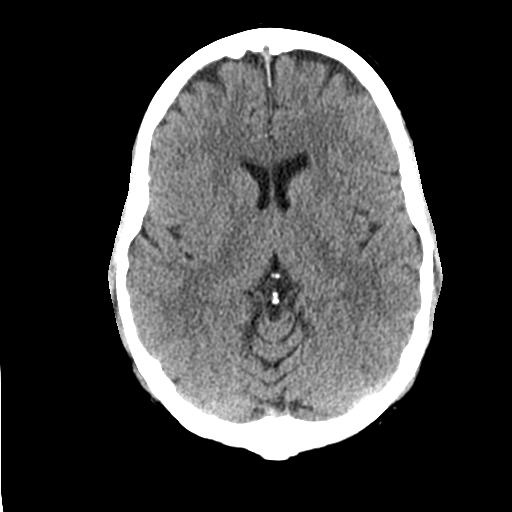
[im 14/32  bone]
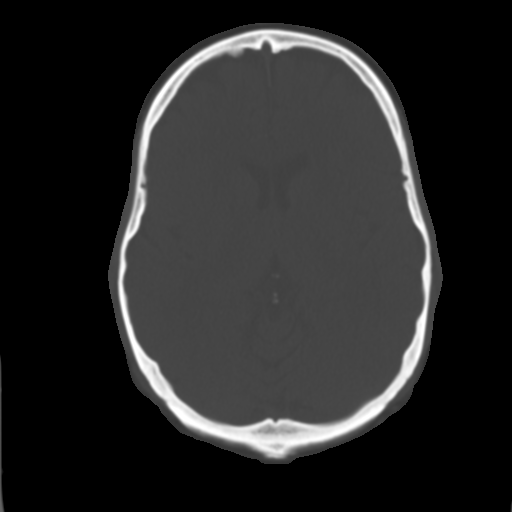
[im 18/32  brain]
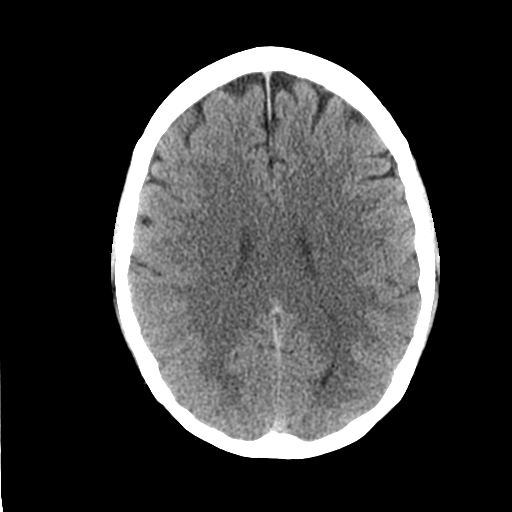
[im 21/32  brain]
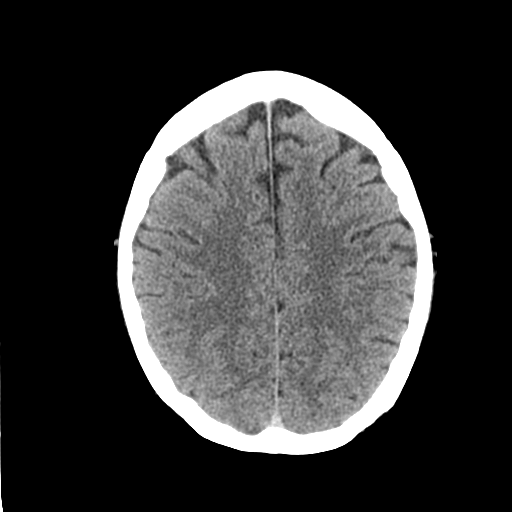
[im 24/32  brain]
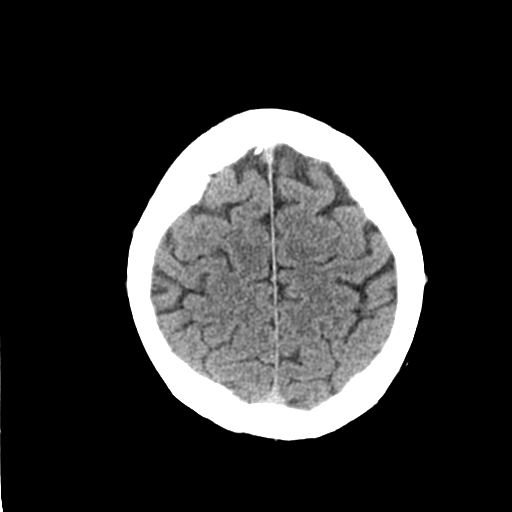
[im 26/32  brain]
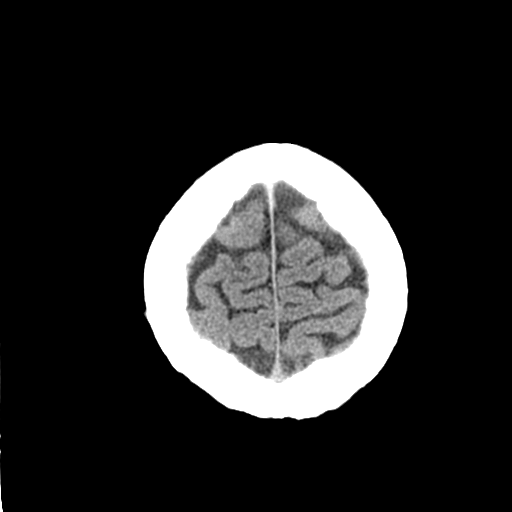
[im 26/32  bone]
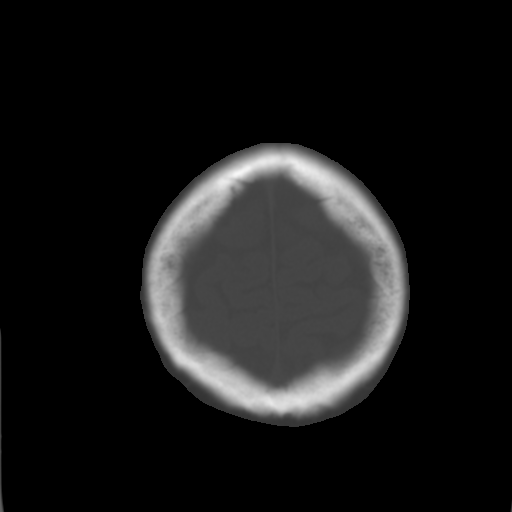
[im 29/32  brain]
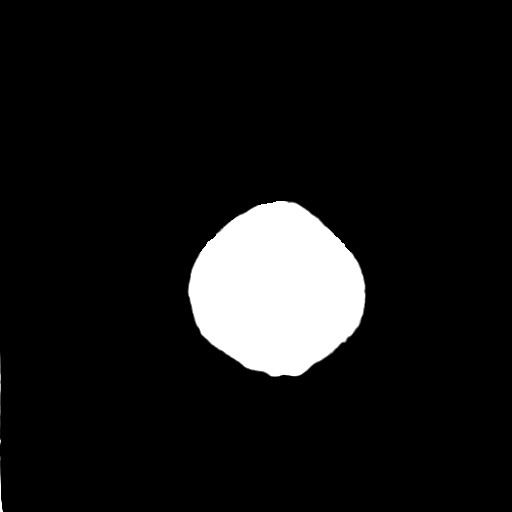

[Series 4: coronal soft tissue · coronal · 0.34mm/px · 3 of 68 slices shown]
[im 30/68  brain]
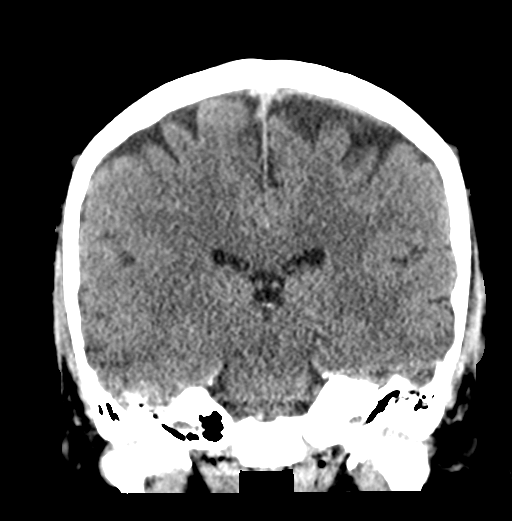
[im 38/68  brain]
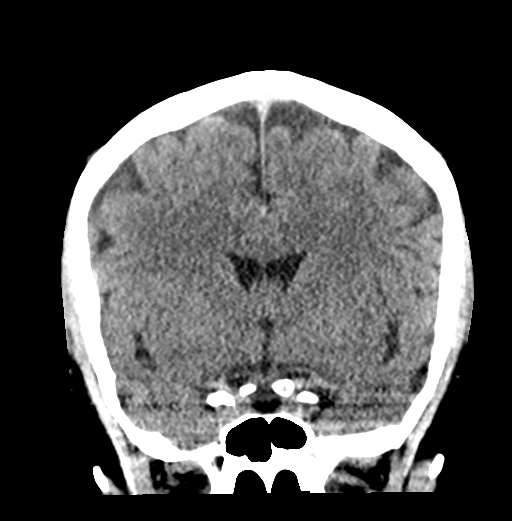
[im 45/68  brain]
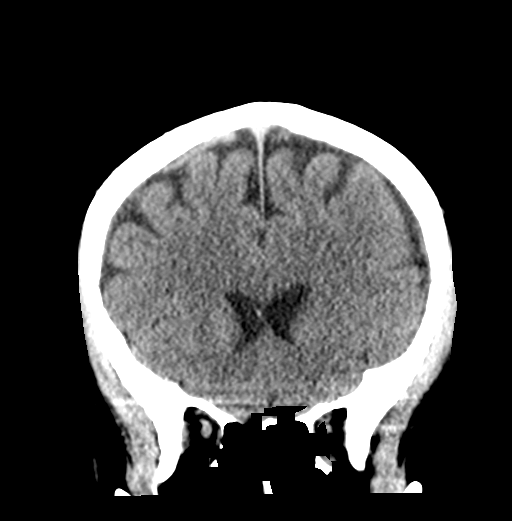

[Series 5: sagittal soft tissue · sagittal · 0.35mm/px · 3 of 59 slices shown]
[im 20/59  brain]
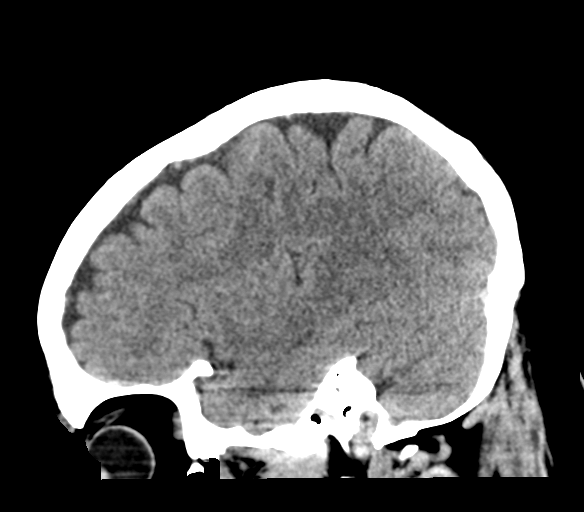
[im 30/59  brain]
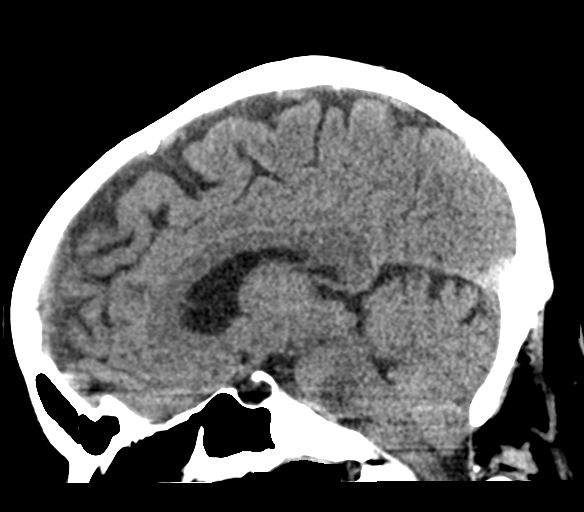
[im 39/59  brain]
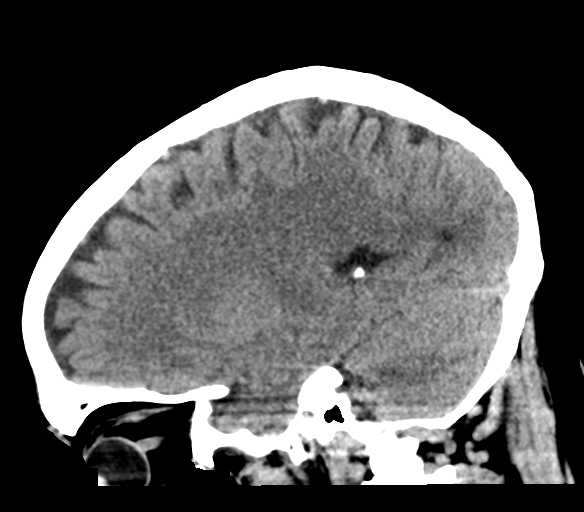

[16 of 47 positions shown; findings below may reference images not displayed]

FINDINGS: Brain: No evidence of acute infarction, hemorrhage, hydrocephalus,
extra-axial collection or mass lesion/mass effect.

Vascular: No hyperdense vessel or unexpected calcification.

Skull: Normal. Negative for fracture or focal lesion.

Sinuses/Orbits: No acute finding.

Other: None.
IMPRESSION: No acute intracranial abnormality noted.

## 2021-06-25 IMAGING — CR DG CHEST 2V
1 series · 2 of 2 positions shown · non-contrast
Comparison: 04/21/2020

CLINICAL DATA: Suspected sepsis. Vomiting and diarrhea. Syncopal
episode.

EXAM:
CHEST - 2 VIEW

[Series 1: dg chest 2 view · 0.14mm/px · 2 of 2 slices shown]
[im 1/2]
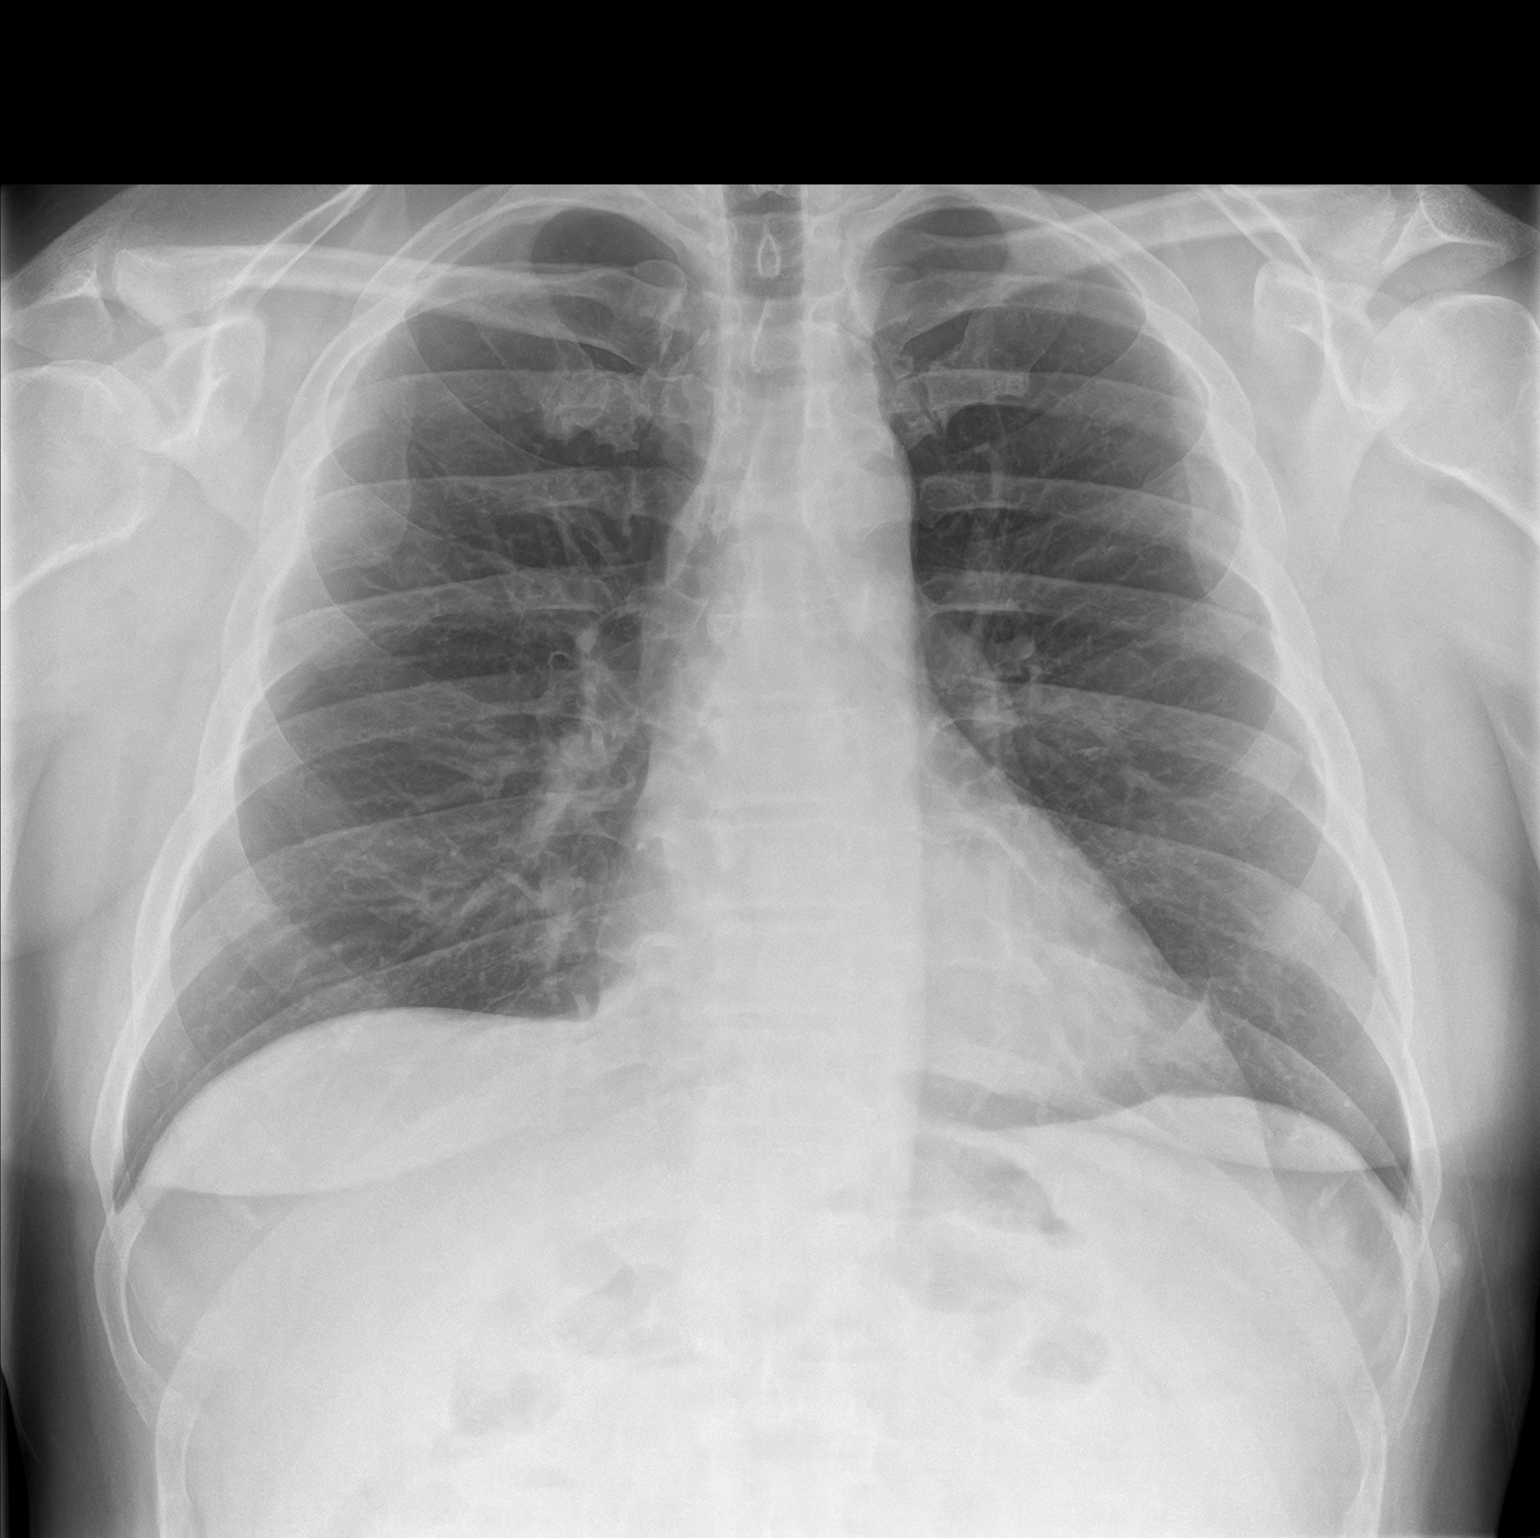
[im 2/2]
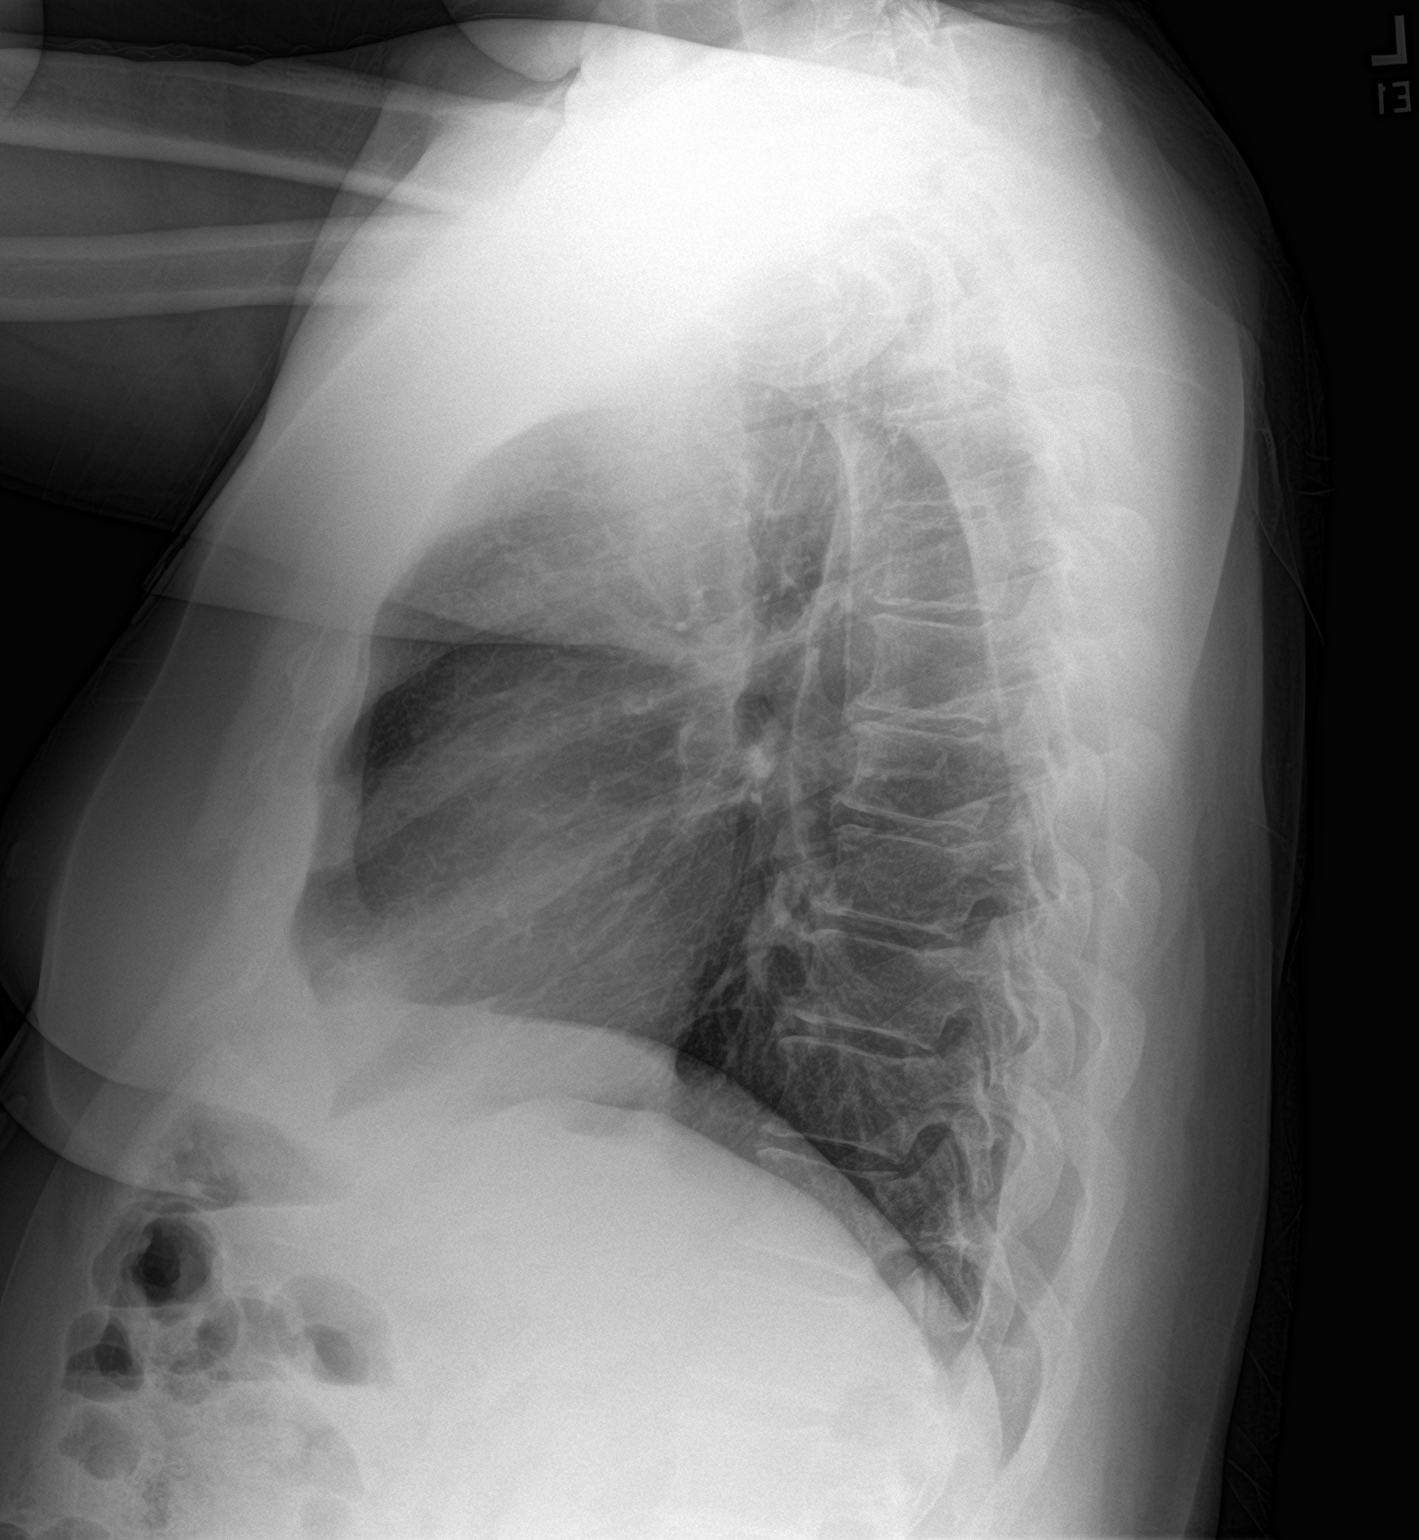

[2 of 2 positions shown; findings below may reference images not displayed]

FINDINGS: The cardiomediastinal contours are normal. The lungs are clear.
Pulmonary vasculature is normal. No consolidation, pleural effusion,
or pneumothorax. No acute osseous abnormalities are seen.
IMPRESSION: Negative radiographs of the chest.

## 2021-08-28 ENCOUNTER — Encounter: Payer: Self-pay | Admitting: Physician Assistant

## 2021-08-28 ENCOUNTER — Ambulatory Visit: Payer: BC Managed Care – PPO | Admitting: Physician Assistant

## 2021-08-28 VITALS — BP 132/94 | HR 111 | Ht 68.0 in | Wt 228.4 lb

## 2021-08-28 DIAGNOSIS — E785 Hyperlipidemia, unspecified: Secondary | ICD-10-CM | POA: Diagnosis not present

## 2021-08-28 DIAGNOSIS — Z1211 Encounter for screening for malignant neoplasm of colon: Secondary | ICD-10-CM | POA: Diagnosis not present

## 2021-08-28 DIAGNOSIS — I10 Essential (primary) hypertension: Secondary | ICD-10-CM

## 2021-08-28 DIAGNOSIS — Z23 Encounter for immunization: Secondary | ICD-10-CM

## 2021-08-28 NOTE — Assessment & Plan Note (Signed)
Not fasting today, advised he get fasting labs. Managed with simvastatin 20 mg ?Goal LDL < 100 ?Will check cmp/cbc today ?

## 2021-08-28 NOTE — Progress Notes (Signed)
?  ? ?I,Sha'taria Tyson,acting as a Education administrator for [employer], PA-C.,have documented all relevant documentation on the behalf of Mikey Kirschner, PA-C,as directed by  Mikey Kirschner, PA-C while in the presence of Mikey Kirschner, PA-C. ? ? ?Established patient visit ? ? ?Patient: Joel Terry   DOB: [birth year]   56 y.o. Male  MRN: [id number] ?Visit Date: 08/28/2021 ? ?Today's healthcare provider: Mikey Kirschner, PA-C  ? ?Cc. Htn. hld f/u ? ?Subjective  ?  ?HPI  ? ?Hypertension, follow-up ? ?BP Readings from Last 3 Encounters:  ?08/28/21 (!) 132/94  ?02/26/21 126/89  ?10/10/20 120/79  ? Wt Readings from Last 3 Encounters:  ?08/28/21 228 lb 6.4 oz (103.6 kg)  ?02/26/21 230 lb (104.3 kg)  ?10/10/20 240 lb (108.9 kg)  ?  ? ?He was last seen for hypertension 6 months ago.  ?BP at that visit was 126/89. Management since that visit includes continue lisinopril 40 mg. ? ?He reports excellent compliance with treatment. ?He is not having side effects.  ?He is following a Regular diet. ?He is exercising. ?He does not smoke. ? ?Use of agents associated with hypertension: none.  ? ?Outside blood pressures are checked occassionally. ?Symptoms: ?No chest pain No chest pressure  ?No palpitations Yes syncope  ?No dyspnea No orthopnea  ?No paroxysmal nocturnal dyspnea No lower extremity edema  ? ?Pertinent labs ?Lab Results  ?Component Value Date  ? CHOL 137 08/12/2019  ? HDL 29 (L) 08/12/2019  ? [place] 90 08/12/2019  ? TRIG 96 08/12/2019  ? CHOLHDL 4.1 01/10/2017  ? Lab Results  ?Component Value Date  ? NA 141 10/10/2020  ? K 5.0 10/10/2020  ? CREATININE 1.27 10/10/2020  ? EGFR 68 10/10/2020  ? GLUCOSE 79 10/10/2020  ? TSH 0.56 01/10/2017  ?  ? ?The 10-year ASCVD risk score (Arnett DK, et al., 2019) is: 6% ? ?--------------------------------------------------------------------------------------------------- ?Lipid/Cholesterol, Follow-up ? ?Last lipid panel Other pertinent labs  ?Lab Results  ?Component Value Date  ? CHOL 137  08/12/2019  ? HDL 29 (L) 08/12/2019  ? [place] 90 08/12/2019  ? TRIG 96 08/12/2019  ? CHOLHDL 4.1 01/10/2017  ? Lab Results  ?Component Value Date  ? ALT 27 10/10/2020  ? AST 21 10/10/2020  ? PLT 432 10/10/2020  ? TSH 0.56 01/10/2017  ?  ? ?He was last seen for this 6 months ago.  ?Management since that visit includes continue statin. ? ?He reports excellent compliance with treatment. ?He is not having side effects.  ? ?Symptoms: ?No chest pain No chest pressure/discomfort  ?No dyspnea No lower extremity edema  ?No numbness or tingling of extremity No orthopnea  ?No palpitations No paroxysmal nocturnal dyspnea  ?No speech difficulty No syncope  ? ?Current diet: well balanced ?Current exercise: walking ? ?The 10-year ASCVD risk score (Arnett DK, et al., 2019) is: 6% ? ?--------------------------------------------------------------------------------------------------- ? ? ?Medications: ?Outpatient Medications Prior to Visit  ?Medication Sig  ? albuterol (VENTOLIN HFA) 108 (90 Base) MCG/ACT inhaler INHALE 2 PUFFS INTO THE LUNGS EVERY 6 HOURS AS NEEDED FOR WHEEZING OR SHORTNESS OF BREATH  ? EPINEPHrine 0.3 mg/0.3 mL IJ SOAJ injection Inject 0.3 mLs (0.3 mg total) into the muscle as needed for anaphylaxis.  ? lisinopril (ZESTRIL) 40 MG tablet Take 1 tablet (40 mg total) by mouth daily.  ? simvastatin (ZOCOR) 20 MG tablet TAKE 1 TABLET(20 MG) BY MOUTH DAILY AT 6 PM  ? SUMAtriptan (IMITREX) 100 MG tablet Take 1 tablet (100 mg total) by mouth every 2 (two) hours  as needed for migraine. May repeat in 2 hours if headache persists or recurs.  ? [DISCONTINUED] apixaban (ELIQUIS) 5 MG TABS tablet Take 1 tablet (5 mg total) by mouth 2 (two) times daily.  ? ?No facility-administered medications prior to visit.  ? ? ?Review of Systems  ?Constitutional:  Negative for fatigue and fever.  ?Respiratory:  Negative for cough and shortness of breath.   ?Cardiovascular:  Negative for chest pain, palpitations and leg swelling.   ?Neurological:  Negative for dizziness and headaches.  ? ? ?  Objective  ?  ?Blood pressure (!) 132/94, pulse (!) 111, height 5' 8"  (1.727 m), weight 228 lb 6.4 oz (103.6 kg), SpO2 100 %.  ? ?Physical Exam ?Constitutional:   ?   General: He is awake.  ?   Appearance: He is well-developed.  ?HENT:  ?   Head: Normocephalic.  ?Eyes:  ?   Conjunctiva/sclera: Conjunctivae normal.  ?Cardiovascular:  ?   Rate and Rhythm: Normal rate and regular rhythm.  ?   Heart sounds: Normal heart sounds.  ?Pulmonary:  ?   Effort: Pulmonary effort is normal.  ?   Breath sounds: Normal breath sounds.  ?Musculoskeletal:  ?   Right lower leg: No edema.  ?   Left lower leg: No edema.  ?Skin: ?   General: Skin is warm.  ?Neurological:  ?   Mental Status: He is alert and oriented to person, place, and time.  ?Psychiatric:     ?   Attention and Perception: Attention normal.     ?   Mood and Affect: Mood normal.     ?   Speech: Speech normal.     ?   Behavior: Behavior is cooperative.  ?  ? ?No results found for any visits on 08/28/21. ? Assessment & Plan  ?  ? ?Problem List Items Addressed This Visit   ? ?  ? Cardiovascular and Mediastinum  ? Benign essential HTN - Primary  ?  Elevated in office today ?Pt does not consistently check at home ?Advised checking 3-4 times a week and recording, f/u 6-8 weeks ? ?  ?  ? Relevant Orders  ? CBC w/Diff/Platelet  ? Comprehensive Metabolic Panel (CMET)  ?  ? Other  ? HLD (hyperlipidemia)  ?  Not fasting today, advised he get fasting labs. Managed with simvastatin 20 mg ?Goal LDL < 100 ?Will check cmp/cbc today ? ?  ?  ? Relevant Orders  ? Lipid Profile  ? ?Other Visit Diagnoses   ? ? Encounter for screening for malignant neoplasm of colon      ? Relevant Orders  ? Ambulatory referral to Gastroenterology  ? Need for shingles vaccine      ? Relevant Orders  ? Varicella-zoster vaccine IM (Shingrix) (Completed)  ? ?  ?  ?Return in about 6 weeks (around 10/09/2021) for hypertension.  ?   ?I, Mikey Kirschner,  PA-C have reviewed all documentation for this visit. The documentation on  08/28/2021  for the exam, diagnosis, procedures, and orders are all accurate and complete. ? ?Mikey Kirschner, PA-C ?[facility] ?[address] ?[place], [place], [address] ?Office: [phone] ?Fax: [phone]  ? ? [facility] ? ?

## 2021-08-28 NOTE — Assessment & Plan Note (Signed)
Elevated in office today ?Pt does not consistently check at home ?Advised checking 3-4 times a week and recording, f/u 6-8 weeks ?

## 2021-08-29 ENCOUNTER — Telehealth: Payer: Self-pay

## 2021-08-29 LAB — COMPREHENSIVE METABOLIC PANEL
ALT: 22 IU/L (ref 0–44)
AST: 27 IU/L (ref 0–40)
Albumin/Globulin Ratio: 1.8 (ref 1.2–2.2)
Albumin: 4.3 g/dL (ref 3.8–4.9)
Alkaline Phosphatase: 83 IU/L (ref 44–121)
BUN/Creatinine Ratio: 14 (ref 9–20)
BUN: 16 mg/dL (ref 6–24)
Bilirubin Total: 0.4 mg/dL (ref 0.0–1.2)
CO2: 22 mmol/L (ref 20–29)
Calcium: 9.5 mg/dL (ref 8.7–10.2)
Chloride: 105 mmol/L (ref 96–106)
Creatinine, Ser: 1.13 mg/dL (ref 0.76–1.27)
Globulin, Total: 2.4 g/dL (ref 1.5–4.5)
Glucose: 91 mg/dL (ref 70–99)
Potassium: 4.3 mmol/L (ref 3.5–5.2)
Sodium: 141 mmol/L (ref 134–144)
Total Protein: 6.7 g/dL (ref 6.0–8.5)
eGFR: 78 mL/min/{1.73_m2} (ref >59)

## 2021-08-29 LAB — CBC WITH DIFFERENTIAL/PLATELET
Basophils Absolute: 0.1 10*3/uL (ref 0.0–0.2)
Basos: 1 %
EOS (ABSOLUTE): 0.1 10*3/uL (ref 0.0–0.4)
Eos: 1 %
Hematocrit: 45.3 % (ref 37.5–51.0)
Hemoglobin: 15.5 g/dL (ref 13.0–17.7)
Immature Grans (Abs): 0 10*3/uL (ref 0.0–0.1)
Immature Granulocytes: 0 %
Lymphocytes Absolute: 1.6 10*3/uL (ref 0.7–3.1)
Lymphs: 20 %
MCH: 30.8 pg (ref 26.6–33.0)
MCHC: 34.2 g/dL (ref 31.5–35.7)
MCV: 90 fL (ref 79–97)
Monocytes Absolute: 0.7 10*3/uL (ref 0.1–0.9)
Monocytes: 9 %
Neutrophils Absolute: 5.6 10*3/uL (ref 1.4–7.0)
Neutrophils: 69 %
Platelets: 401 10*3/uL (ref 150–450)
RBC: 5.03 x10E6/uL (ref 4.14–5.80)
RDW: 13.2 % (ref 11.6–15.4)
WBC: 8 10*3/uL (ref 3.4–10.8)

## 2021-08-29 NOTE — Telephone Encounter (Signed)
CALLED PATIENT NO ANSWER LEFT VOICEMAIL FOR A CALL BACK ? ?

## 2021-08-30 ENCOUNTER — Telehealth: Payer: Self-pay

## 2021-08-30 NOTE — Telephone Encounter (Signed)
CALLED PATIENT NO ANSWER LEFT VOICEMAIL FOR A CALL BACK ? ?

## 2021-08-31 ENCOUNTER — Telehealth: Payer: Self-pay

## 2021-08-31 NOTE — Telephone Encounter (Signed)
CALLED PATIENT NO ANSWER LEFT VOICEMAIL FOR A CALL BACK °Letter sent °

## 2021-10-18 ENCOUNTER — Ambulatory Visit: Payer: BC Managed Care – PPO | Admitting: Physician Assistant

## 2021-10-18 ENCOUNTER — Encounter: Payer: Self-pay | Admitting: Physician Assistant

## 2021-10-18 VITALS — BP 138/105 | HR 84 | Ht 68.0 in | Wt 233.9 lb

## 2021-10-18 DIAGNOSIS — E785 Hyperlipidemia, unspecified: Secondary | ICD-10-CM

## 2021-10-18 DIAGNOSIS — I10 Essential (primary) hypertension: Secondary | ICD-10-CM | POA: Diagnosis not present

## 2021-10-18 DIAGNOSIS — R531 Weakness: Secondary | ICD-10-CM | POA: Diagnosis not present

## 2021-10-18 MED ORDER — AMLODIPINE BESYLATE 5 MG PO TABS: 5.0000 mg | ORAL_TABLET | Freq: Every day | ORAL | 1 refills | Status: DC

## 2021-10-18 NOTE — Progress Notes (Signed)
I,Sha'taria Tyson,acting as a Education administrator for [employer], PA-C.,have documented all relevant documentation on the behalf of Mikey Kirschner, PA-C,as directed by  Mikey Kirschner, PA-C while in the presence of Mikey Kirschner, PA-C.   Established patient visit   Patient: Joel Terry   DOB: [birth year]   56 y.o. Male  MRN: [id number] Visit Date: 10/18/2021  Today's healthcare provider: Mikey Kirschner, PA-C   Cc. Htn f/u  Subjective    HPI  Patient is in today for chronic care follow-up.  He also reports feeling weak, shaking. Believes he is dehydrated. Worked outside over the weekend. Reports feeling easily dehydrated d/t his colectomy d/t UC.  Hypertension, follow-up  BP Readings from Last 3 Encounters:  10/18/21 (!) 138/105  08/28/21 (!) 132/94  02/26/21 126/89   Wt Readings from Last 3 Encounters:  10/18/21 233 lb 14.4 oz (106.1 kg)  08/28/21 228 lb 6.4 oz (103.6 kg)  02/26/21 230 lb (104.3 kg)     He was last seen for hypertension 8 weeks ago.  BP at that visit was 132/94. Management since that visit includes checking 3-4 times a week and recording as well as continue current medication.  He reports excellent compliance with treatment. He is not having side effects.  He is following a Regular diet. He is not exercising. He does not smoke.  Use of agents associated with hypertension: none.   Outside blood pressures are not being checked Symptoms: No chest pain No chest pressure  No palpitations No syncope  No dyspnea No orthopnea  No paroxysmal nocturnal dyspnea No lower extremity edema   Pertinent labs Lab Results  Component Value Date   CHOL 137 08/12/2019   HDL 29 (L) 08/12/2019   LDLCALC 90 08/12/2019   TRIG 96 08/12/2019   CHOLHDL 4.1 01/10/2017   Lab Results  Component Value Date   NA 141 08/28/2021   K 4.3 08/28/2021   CREATININE 1.13 08/28/2021   EGFR 78 08/28/2021   GLUCOSE 91 08/28/2021   TSH 0.56 01/10/2017     The 10-year ASCVD risk  score (Arnett DK, et al., 2019) is: 6.5%  ---------------------------------------------------------------------------------------------------   Medications: Outpatient Medications Prior to Visit  Medication Sig   albuterol (VENTOLIN HFA) 108 (90 Base) MCG/ACT inhaler INHALE 2 PUFFS INTO THE LUNGS EVERY 6 HOURS AS NEEDED FOR WHEEZING OR SHORTNESS OF BREATH   EPINEPHrine 0.3 mg/0.3 mL IJ SOAJ injection Inject 0.3 mLs (0.3 mg total) into the muscle as needed for anaphylaxis.   lisinopril (ZESTRIL) 40 MG tablet Take 1 tablet (40 mg total) by mouth daily.   simvastatin (ZOCOR) 20 MG tablet TAKE 1 TABLET(20 MG) BY MOUTH DAILY AT 6 PM   SUMAtriptan (IMITREX) 100 MG tablet Take 1 tablet (100 mg total) by mouth every 2 (two) hours as needed for migraine. May repeat in 2 hours if headache persists or recurs.   No facility-administered medications prior to visit.    Review of Systems  Constitutional:  Positive for fatigue. Negative for fever.  Respiratory:  Negative for cough and shortness of breath.   Cardiovascular:  Negative for chest pain, palpitations and leg swelling.  Neurological:  Positive for weakness. Negative for dizziness and headaches.       Objective    Blood pressure (!) 138/105, pulse 84, height 5' 8"  (1.727 m), weight 233 lb 14.4 oz (106.1 kg), SpO2 98 %.   Physical Exam Constitutional:      General: He is awake.     Appearance:  He is well-developed.  HENT:     Head: Normocephalic.  Eyes:     Conjunctiva/sclera: Conjunctivae normal.  Cardiovascular:     Rate and Rhythm: Normal rate and regular rhythm.     Heart sounds: Normal heart sounds.  Pulmonary:     Effort: Pulmonary effort is normal.     Breath sounds: Normal breath sounds.  Musculoskeletal:     Right lower leg: No edema.     Left lower leg: No edema.  Skin:    General: Skin is warm.  Neurological:     Mental Status: He is alert and oriented to person, place, and time.  Psychiatric:        Attention and  Perception: Attention normal.        Mood and Affect: Mood normal.        Speech: Speech normal.        Behavior: Behavior is cooperative.      No results found for any visits on 10/18/21.  Assessment & Plan     Problem List Items Addressed This Visit       Cardiovascular and Mediastinum   Benign essential HTN - Primary    Continue lisinopril 40 mg, adding amlodipine 5 mg. Likely will need higher dose. Would stay away from hctz if possible d/t predisposition to dehydration  F/u 4 weeks. Advised pt strongly to check at home.      Relevant Medications   amLODipine (NORVASC) 5 MG tablet     Other   Weak    Advised increase in fluids, supplement with Gatorade, Pedialyte.  Will check cmp, cbc      Relevant Orders   Comprehensive Metabolic Panel (CMET)   CBC w/Diff/Platelet   Magnesium     Return in about 4 weeks (around 11/15/2021) for hypertension, hyperlipidemia.      I, Mikey Kirschner, PA-C have reviewed all documentation for this visit. The documentation on  10/18/2021 for the exam, diagnosis, procedures, and orders are all accurate and complete.  Mikey Kirschner, PA-C [facility] [address] [place], [place], [address] Office: [phone] Fax: [place]

## 2021-10-19 ENCOUNTER — Encounter: Payer: Self-pay | Admitting: Physician Assistant

## 2021-10-19 NOTE — Assessment & Plan Note (Signed)
Advised increase in fluids, supplement with Gatorade, Pedialyte.  Will check cmp, cbc

## 2021-10-19 NOTE — Assessment & Plan Note (Addendum)
Continue lisinopril 40 mg, adding amlodipine 5 mg. Likely will need higher dose. Would stay away from hctz if possible d/t predisposition to dehydration  F/u 4 weeks. Advised pt strongly to check at home.

## 2021-11-15 ENCOUNTER — Encounter: Payer: Self-pay | Admitting: Physician Assistant

## 2021-11-15 ENCOUNTER — Ambulatory Visit: Payer: BC Managed Care – PPO | Admitting: Physician Assistant

## 2021-11-15 VITALS — BP 139/95 | HR 78 | Ht 68.0 in | Wt 251.9 lb

## 2021-11-15 DIAGNOSIS — E785 Hyperlipidemia, unspecified: Secondary | ICD-10-CM | POA: Diagnosis not present

## 2021-11-15 DIAGNOSIS — I10 Essential (primary) hypertension: Secondary | ICD-10-CM | POA: Diagnosis not present

## 2021-11-15 DIAGNOSIS — R531 Weakness: Secondary | ICD-10-CM | POA: Diagnosis not present

## 2021-11-15 MED ORDER — AMLODIPINE BESYLATE 10 MG PO TABS: 10.0000 mg | ORAL_TABLET | Freq: Every day | ORAL | 1 refills | Status: DC

## 2021-11-15 NOTE — Progress Notes (Signed)
I,Sha'taria Tyson,acting as a Education administrator for [employer], PA-C.,have documented all relevant documentation on the behalf of Mikey Kirschner, PA-C,as directed by  Mikey Kirschner, PA-C while in the presence of Mikey Kirschner, PA-C.   Established patient visit   Patient: Joel Terry   DOB: 24-Apr-1965   56 y.o. Male  MRN: [id number] Visit Date: 11/15/2021  Today's healthcare provider: Mikey Kirschner, PA-C   HTN f/u  Subjective    HPI  Hypertension, follow-up  BP Readings from Last 3 Encounters:  11/15/21 (!) 139/95  10/18/21 (!) 138/105  08/28/21 (!) 132/94   Wt Readings from Last 3 Encounters:  11/15/21 251 lb 14.4 oz (114.3 kg)  10/18/21 233 lb 14.4 oz (106.1 kg)  08/28/21 228 lb 6.4 oz (103.6 kg)     He was last seen for hypertension 4 weeks ago.  BP at that visit was 138/105. Management since that visit includes Continue lisinopril 40 mg, adding amlodipine 5 mg. Would stay away from hctz if possible d/t predisposition to dehydration   He reports excellent compliance with treatment. He is not having side effects.  He is following a Regular diet. He is exercising. He does not smoke.  Use of agents associated with hypertension: none.   Outside blood pressures are checked on occasion Symptoms: No chest pain No chest pressure  No palpitations Yes syncope  No dyspnea No orthopnea  No paroxysmal nocturnal dyspnea No lower extremity edema   Pertinent labs Lab Results  Component Value Date   CHOL 176 11/15/2021   HDL 30 (L) 11/15/2021   LDLCALC 126 (H) 11/15/2021   TRIG 108 11/15/2021   CHOLHDL 5.9 (H) 11/15/2021   Lab Results  Component Value Date   NA 139 11/15/2021   K 4.6 11/15/2021   CREATININE 1.09 11/15/2021   EGFR 81 11/15/2021   GLUCOSE 82 11/15/2021   TSH 0.56 01/10/2017     The 10-year ASCVD risk score (Arnett DK, et al., 2019) is:  9.4%  ---------------------------------------------------------------------------------------------------   Medications: Outpatient Medications Prior to Visit  Medication Sig   albuterol (VENTOLIN HFA) 108 (90 Base) MCG/ACT inhaler INHALE 2 PUFFS INTO THE LUNGS EVERY 6 HOURS AS NEEDED FOR WHEEZING OR SHORTNESS OF BREATH   EPINEPHrine 0.3 mg/0.3 mL IJ SOAJ injection Inject 0.3 mLs (0.3 mg total) into the muscle as needed for anaphylaxis.   lisinopril (ZESTRIL) 40 MG tablet Take 1 tablet (40 mg total) by mouth daily.   simvastatin (ZOCOR) 20 MG tablet TAKE 1 TABLET(20 MG) BY MOUTH DAILY AT 6 PM   SUMAtriptan (IMITREX) 100 MG tablet Take 1 tablet (100 mg total) by mouth every 2 (two) hours as needed for migraine. May repeat in 2 hours if headache persists or recurs.   [DISCONTINUED] amLODipine (NORVASC) 5 MG tablet Take 1 tablet (5 mg total) by mouth daily.   No facility-administered medications prior to visit.    Review of Systems  Constitutional:  Negative for fatigue and fever.  Respiratory:  Negative for cough and shortness of breath.   Cardiovascular:  Negative for chest pain, palpitations and leg swelling.  Neurological:  Negative for dizziness and headaches.      Objective    Blood pressure (!) 139/95, pulse 78, height 5' 8"  (1.727 m), weight 251 lb 14.4 oz (114.3 kg), SpO2 98 %.   Physical Exam Vitals reviewed.  Constitutional:      Appearance: He is not ill-appearing.  HENT:     Head: Normocephalic.  Eyes:  Conjunctiva/sclera: Conjunctivae normal.  Cardiovascular:     Rate and Rhythm: Normal rate.  Pulmonary:     Effort: Pulmonary effort is normal. No respiratory distress.  Neurological:     General: No focal deficit present.     Mental Status: He is alert and oriented to person, place, and time.  Psychiatric:        Mood and Affect: Mood normal.        Behavior: Behavior normal.      Results for orders placed or performed in visit on 11/15/21  Lipid  Profile  Result Value Ref Range   Cholesterol, Total 176 100 - 199 mg/dL   Triglycerides 108 0 - 149 mg/dL   HDL 30 (L) >39 mg/dL   VLDL Cholesterol Cal 20 5 - 40 mg/dL   LDL Chol Calc (NIH) 126 (H) 0 - 99 mg/dL   Chol/HDL Ratio 5.9 (H) 0.0 - 5.0 ratio  Comprehensive Metabolic Panel (CMET)  Result Value Ref Range   Glucose 82 70 - 99 mg/dL   BUN 15 6 - 24 mg/dL   Creatinine, Ser 1.09 0.76 - 1.27 mg/dL   eGFR 81 >59 mL/min/1.73   BUN/Creatinine Ratio 14 9 - 20   Sodium 139 134 - 144 mmol/L   Potassium 4.6 3.5 - 5.2 mmol/L   Chloride 101 96 - 106 mmol/L   CO2 22 20 - 29 mmol/L   Calcium 9.9 8.7 - 10.2 mg/dL   Total Protein 7.2 6.0 - 8.5 g/dL   Albumin 4.6 3.8 - 4.9 g/dL   Globulin, Total 2.6 1.5 - 4.5 g/dL   Albumin/Globulin Ratio 1.8 1.2 - 2.2   Bilirubin Total 0.6 0.0 - 1.2 mg/dL   Alkaline Phosphatase 89 44 - 121 IU/L   AST 27 0 - 40 IU/L   ALT 28 0 - 44 IU/L  CBC w/Diff/Platelet  Result Value Ref Range   WBC 7.9 3.4 - 10.8 x10E3/uL   RBC 5.25 4.14 - 5.80 x10E6/uL   Hemoglobin 16.1 13.0 - 17.7 g/dL   Hematocrit 47.7 37.5 - 51.0 %   MCV 91 79 - 97 fL   MCH 30.7 26.6 - 33.0 pg   MCHC 33.8 31.5 - 35.7 g/dL   RDW 12.8 11.6 - 15.4 %   Platelets 396 150 - 450 x10E3/uL   Neutrophils 62 Not Estab. %   Lymphs 26 Not Estab. %   Monocytes 9 Not Estab. %   Eos 2 Not Estab. %   Basos 1 Not Estab. %   Neutrophils Absolute 4.9 1.4 - 7.0 x10E3/uL   Lymphocytes Absolute 2.1 0.7 - 3.1 x10E3/uL   Monocytes Absolute 0.7 0.1 - 0.9 x10E3/uL   EOS (ABSOLUTE) 0.1 0.0 - 0.4 x10E3/uL   Basophils Absolute 0.1 0.0 - 0.2 x10E3/uL   Immature Granulocytes 0 Not Estab. %   Immature Grans (Abs) 0.0 0.0 - 0.1 x10E3/uL  Magnesium  Result Value Ref Range   Magnesium 2.1 1.6 - 2.3 mg/dL    Assessment & Plan     Problem List Items Addressed This Visit       Cardiovascular and Mediastinum   Benign essential HTN    Continue lisinopril 40 mg, increase amlodipine to 10 mg daily.  If pressure  continues to be uncontrolled, and pt is not compliant with checking at home, will ref to cardio .  Re-ordered labs from last visit as he did not complete them      Relevant Medications   amLODipine (NORVASC) 10 MG tablet  Other Relevant Orders   Comprehensive Metabolic Panel (CMET) (Completed)     Other   HLD (hyperlipidemia) - Primary    Fasting today, ordered lipid panel Currently managed with simvastatin 20 mg        Relevant Medications   amLODipine (NORVASC) 10 MG tablet   Other Relevant Orders   Lipid Profile (Completed)   Weak    Re-ordered labs from last visit. Pt is feeling improved today.      Relevant Orders   Comprehensive Metabolic Panel (CMET) (Completed)   CBC w/Diff/Platelet (Completed)   Magnesium (Completed)    Return in about 4 weeks (around 12/13/2021) for hypertension.      I, Mikey Kirschner, PA-C have reviewed all documentation for this visit. The documentation on  11/15/2021  for the exam, diagnosis, procedures, and orders are all accurate and complete.  Mikey Kirschner, PA-C [facility] [address] [place], [place], [address] Office: [phone] Fax: [place]

## 2021-11-16 LAB — COMPREHENSIVE METABOLIC PANEL
ALT: 28 IU/L (ref 0–44)
AST: 27 IU/L (ref 0–40)
Albumin/Globulin Ratio: 1.8 (ref 1.2–2.2)
Albumin: 4.6 g/dL (ref 3.8–4.9)
Alkaline Phosphatase: 89 IU/L (ref 44–121)
BUN/Creatinine Ratio: 14 (ref 9–20)
BUN: 15 mg/dL (ref 6–24)
Bilirubin Total: 0.6 mg/dL (ref 0.0–1.2)
CO2: 22 mmol/L (ref 20–29)
Calcium: 9.9 mg/dL (ref 8.7–10.2)
Chloride: 101 mmol/L (ref 96–106)
Creatinine, Ser: 1.09 mg/dL (ref 0.76–1.27)
Globulin, Total: 2.6 g/dL (ref 1.5–4.5)
Glucose: 82 mg/dL (ref 70–99)
Potassium: 4.6 mmol/L (ref 3.5–5.2)
Sodium: 139 mmol/L (ref 134–144)
Total Protein: 7.2 g/dL (ref 6.0–8.5)
eGFR: 81 mL/min/{1.73_m2} (ref >59)

## 2021-11-16 LAB — CBC WITH DIFFERENTIAL/PLATELET
Basophils Absolute: 0.1 10*3/uL (ref 0.0–0.2)
Basos: 1 %
EOS (ABSOLUTE): 0.1 10*3/uL (ref 0.0–0.4)
Eos: 2 %
Hematocrit: 47.7 % (ref 37.5–51.0)
Hemoglobin: 16.1 g/dL (ref 13.0–17.7)
Immature Grans (Abs): 0 10*3/uL (ref 0.0–0.1)
Immature Granulocytes: 0 %
Lymphocytes Absolute: 2.1 10*3/uL (ref 0.7–3.1)
Lymphs: 26 %
MCH: 30.7 pg (ref 26.6–33.0)
MCHC: 33.8 g/dL (ref 31.5–35.7)
MCV: 91 fL (ref 79–97)
Monocytes Absolute: 0.7 10*3/uL (ref 0.1–0.9)
Monocytes: 9 %
Neutrophils Absolute: 4.9 10*3/uL (ref 1.4–7.0)
Neutrophils: 62 %
Platelets: 396 10*3/uL (ref 150–450)
RBC: 5.25 x10E6/uL (ref 4.14–5.80)
RDW: 12.8 % (ref 11.6–15.4)
WBC: 7.9 10*3/uL (ref 3.4–10.8)

## 2021-11-16 LAB — MAGNESIUM: Magnesium: 2.1 mg/dL (ref 1.6–2.3)

## 2021-11-16 LAB — LIPID PANEL
Chol/HDL Ratio: 5.9 ratio — ABNORMAL HIGH (ref 0.0–5.0)
Cholesterol, Total: 176 mg/dL (ref 100–199)
HDL: 30 mg/dL — ABNORMAL LOW (ref >39)
LDL Chol Calc (NIH): 126 mg/dL — ABNORMAL HIGH (ref 0–99)
Triglycerides: 108 mg/dL (ref 0–149)
VLDL Cholesterol Cal: 20 mg/dL (ref 5–40)

## 2021-11-19 ENCOUNTER — Encounter: Payer: Self-pay | Admitting: Physician Assistant

## 2021-11-19 NOTE — Assessment & Plan Note (Signed)
Fasting today, ordered lipid panel Currently managed with simvastatin 20 mg

## 2021-11-19 NOTE — Assessment & Plan Note (Signed)
Re-ordered labs from last visit. Pt is feeling improved today.

## 2021-11-19 NOTE — Assessment & Plan Note (Signed)
Continue lisinopril 40 mg, increase amlodipine to 10 mg daily.  If pressure continues to be uncontrolled, and pt is not compliant with checking at home, will ref to cardio .  Re-ordered labs from last visit as he did not complete them

## 2021-12-18 ENCOUNTER — Ambulatory Visit: Payer: BC Managed Care – PPO | Admitting: Physician Assistant

## 2021-12-18 ENCOUNTER — Encounter: Payer: Self-pay | Admitting: Physician Assistant

## 2021-12-18 VITALS — BP 133/95 | HR 101 | Ht 68.0 in | Wt 232.2 lb

## 2021-12-18 DIAGNOSIS — I10 Essential (primary) hypertension: Secondary | ICD-10-CM | POA: Diagnosis not present

## 2021-12-18 DIAGNOSIS — Z23 Encounter for immunization: Secondary | ICD-10-CM | POA: Diagnosis not present

## 2021-12-18 DIAGNOSIS — R251 Tremor, unspecified: Secondary | ICD-10-CM

## 2021-12-18 NOTE — Assessment & Plan Note (Addendum)
Managing with lisinopril 40 mg and amlodipine 10 mg  Improvement in office, pt still does not check at home. Suspicion for normal home values, stressed again importance of checking bp at home  F/u 3 mo

## 2021-12-18 NOTE — Assessment & Plan Note (Signed)
Ref to neurology  Multiple possible etiology of tremor

## 2021-12-18 NOTE — Progress Notes (Signed)
I,Sha'taria Tyson,acting as a Education administrator for [employer], PA-C.,have documented all relevant documentation on the behalf of Mikey Kirschner, PA-C,as directed by  Mikey Kirschner, PA-C while in the presence of Mikey Kirschner, PA-C.  Established patient visit   Patient: Joel Terry   DOB: 06-27-1965   56 y.o. Male  MRN: [id number] Visit Date: 12/18/2021  Today's healthcare provider: Mikey Kirschner, PA-C   Cc. Htn f/u  Subjective    HPI  Tremor/weakness -Pt reports an 'essential tremor' in both hands/arms that has been present for a few years, but is worsening. He finds it hard to do fine motor tasks ie writing, grasping. Denies pain, numbness. Reports his father had a similar issue, but had trouble with larger tasks/movements and was fine with fine motor.  Hypertension, follow-up  BP Readings from Last 3 Encounters:  12/18/21 (!) 133/95  11/15/21 (!) 139/95  10/18/21 (!) 138/105   Wt Readings from Last 3 Encounters:  12/18/21 232 lb 3.2 oz (105.3 kg)  11/15/21 251 lb 14.4 oz (114.3 kg)  10/18/21 233 lb 14.4 oz (106.1 kg)     He was last seen for hypertension 4 weeks ago.  BP at that visit was 139/95. Management since that visit includes continue lisinopril 40 mg, increase amlodipine to 10 mg daily.  He reports excellent compliance with treatment. He is not having side effects.  He is following a Regular diet. He is exercising. He does not smoke.  Use of agents associated with hypertension: none.   Outside blood pressures are not being checked Symptoms: No chest pain No chest pressure  No palpitations No syncope  No dyspnea No orthopnea  No paroxysmal nocturnal dyspnea No lower extremity edema   Pertinent labs Lab Results  Component Value Date   CHOL 176 11/15/2021   HDL 30 (L) 11/15/2021   LDLCALC 126 (H) 11/15/2021   TRIG 108 11/15/2021   CHOLHDL 5.9 (H) 11/15/2021   Lab Results  Component Value Date   NA 139 11/15/2021   K 4.6 11/15/2021   CREATININE 1.09  11/15/2021   EGFR 81 11/15/2021   GLUCOSE 82 11/15/2021   TSH 0.56 01/10/2017     The 10-year ASCVD risk score (Arnett DK, et al., 2019) is: 8.7%  ---------------------------------------------------------------------------------------------------   Medications: Outpatient Medications Prior to Visit  Medication Sig   albuterol (VENTOLIN HFA) 108 (90 Base) MCG/ACT inhaler INHALE 2 PUFFS INTO THE LUNGS EVERY 6 HOURS AS NEEDED FOR WHEEZING OR SHORTNESS OF BREATH   amLODipine (NORVASC) 10 MG tablet Take 1 tablet (10 mg total) by mouth daily.   EPINEPHrine 0.3 mg/0.3 mL IJ SOAJ injection Inject 0.3 mLs (0.3 mg total) into the muscle as needed for anaphylaxis.   lisinopril (ZESTRIL) 40 MG tablet Take 1 tablet (40 mg total) by mouth daily.   simvastatin (ZOCOR) 20 MG tablet TAKE 1 TABLET(20 MG) BY MOUTH DAILY AT 6 PM   SUMAtriptan (IMITREX) 100 MG tablet Take 1 tablet (100 mg total) by mouth every 2 (two) hours as needed for migraine. May repeat in 2 hours if headache persists or recurs.   No facility-administered medications prior to visit.    Review of Systems  Constitutional:  Negative for fatigue and fever.  Respiratory:  Negative for cough and shortness of breath.   Cardiovascular:  Negative for chest pain, palpitations and leg swelling.  Neurological:  Positive for tremors and weakness. Negative for dizziness and headaches.      Objective    Blood pressure (!) 133/95,  pulse (!) 101, height 5' 8"  (1.727 m), weight 232 lb 3.2 oz (105.3 kg), SpO2 99 %.   Physical Exam Constitutional:      General: He is awake.     Appearance: He is well-developed.  HENT:     Head: Normocephalic.  Eyes:     Conjunctiva/sclera: Conjunctivae normal.  Cardiovascular:     Rate and Rhythm: Normal rate and regular rhythm.     Heart sounds: Normal heart sounds.  Pulmonary:     Effort: Pulmonary effort is normal.     Breath sounds: Normal breath sounds.  Skin:    General: Skin is warm.   Neurological:     Mental Status: He is alert and oriented to person, place, and time.     Comments: Intention tremor present, grip strength 5/5 but weaker on right hand than left. Pt sits holding right arm straight but no visible tremor in hands at rest   Psychiatric:        Attention and Perception: Attention normal.        Mood and Affect: Mood normal.        Speech: Speech normal.        Behavior: Behavior is cooperative.      No results found for any visits on 12/18/21.  Assessment & Plan     Problem List Items Addressed This Visit       Cardiovascular and Mediastinum   Benign essential HTN - Primary    Managing with lisinopril 40 mg and amlodipine 10 mg  Improvement in office, pt still does not check at home. Suspicion for normal home values, stressed again importance of checking bp at home  F/u 3 mo        Other   Tremor    Ref to neurology  Multiple possible etiology of tremor      Relevant Orders   Ambulatory referral to Neurology   Other Visit Diagnoses     Need for influenza vaccination       Relevant Orders   Flu Vaccine QUAD 6+ mos PF IM (Fluarix Quad PF)        Return in about 3 months (around 03/19/2022) for hypertension.      I, Mikey Kirschner, PA-C have reviewed all documentation for this visit. The documentation on  12/18/2021  for the exam, diagnosis, procedures, and orders are all accurate and complete.  Mikey Kirschner, PA-C [facility] [address] [place], [place], [address] Office: [phone] Fax: [place]

## 2021-12-24 ENCOUNTER — Encounter: Payer: Self-pay | Admitting: Physician Assistant

## 2022-03-19 ENCOUNTER — Other Ambulatory Visit: Payer: Self-pay | Admitting: Physician Assistant

## 2022-03-19 DIAGNOSIS — I10 Essential (primary) hypertension: Secondary | ICD-10-CM

## 2022-04-20 ENCOUNTER — Other Ambulatory Visit: Payer: Self-pay | Admitting: Physician Assistant

## 2022-04-20 DIAGNOSIS — I10 Essential (primary) hypertension: Secondary | ICD-10-CM

## 2022-04-22 ENCOUNTER — Telehealth: Payer: BC Managed Care – PPO | Admitting: Emergency Medicine

## 2022-04-22 DIAGNOSIS — J111 Influenza due to unidentified influenza virus with other respiratory manifestations: Secondary | ICD-10-CM | POA: Diagnosis not present

## 2022-04-22 MED ORDER — ALBUTEROL SULFATE HFA 108 (90 BASE) MCG/ACT IN AERS
2.0000 | INHALATION_SPRAY | Freq: Four times a day (QID) | RESPIRATORY_TRACT | 1 refills | Status: DC | PRN
Start: 2022-04-22 — End: 2022-06-11

## 2022-04-22 MED ORDER — BENZONATATE 100 MG PO CAPS: 100.0000 mg | ORAL_CAPSULE | Freq: Two times a day (BID) | ORAL | 0 refills | Status: DC | PRN

## 2022-04-22 MED ORDER — OSELTAMIVIR PHOSPHATE 75 MG PO CAPS: 75.0000 mg | ORAL_CAPSULE | Freq: Two times a day (BID) | ORAL | 0 refills | Status: DC

## 2022-04-22 NOTE — Progress Notes (Signed)
Virtual Visit Consent   Joel Terry, you are scheduled for a virtual visit with a [facility] provider today. Just as with appointments in the office, your consent must be obtained to participate. Your consent will be active for this visit and any virtual visit you may have with one of our providers in the next 365 days. If you have a MyChart account, a copy of this consent can be sent to you electronically.  As this is a virtual visit, video technology does not allow for your provider to perform a traditional examination. This may limit your provider's ability to fully assess your condition. If your provider identifies any concerns that need to be evaluated in person or the need to arrange testing (such as labs, EKG, etc.), we will make arrangements to do so. Although advances in technology are sophisticated, we cannot ensure that it will always work on either your end or our end. If the connection with a video visit is poor, the visit may have to be switched to a telephone visit. With either a video or telephone visit, we are not always able to ensure that we have a secure connection.  By engaging in this virtual visit, you consent to the provision of healthcare and authorize for your insurance to be billed (if applicable) for the services provided during this visit. Depending on your insurance coverage, you may receive a charge related to this service.  I need to obtain your verbal consent now. Are you willing to proceed with your visit today? Joel Terry has provided verbal consent on 04/22/2022 for a virtual visit (video or telephone). Carvel Getting, NP  Date: 04/22/2022 8:42 AM  Virtual Visit via Video Note   I, Carvel Getting, connected with  Joel Terry  ([id number], 03/12/1966) on 04/22/22 at  8:30 AM EST by a video-enabled telemedicine application and verified that I am speaking with the correct person using two identifiers.  Location: Patient: Virtual Visit Location Patient:  Home Provider: Virtual Visit Location Provider: Home Office   I discussed the limitations of evaluation and management by telemedicine and the availability of in person appointments. The patient expressed understanding and agreed to proceed.    History of Present Illness: Joel Terry is a 57 y.o. who identifies as a male who was assigned male at birth, and is being seen today for feeling ill for 3 days. Reports fatigue, body aches (very bothersome), coughing, nasal congestion, chest congestion. No fever - temp 97.7F. SpO2 at home is 97%, BP 129/78. Has been taking mucinex. Does have asthma, tried using inhaler last night but it didn't seem to make a difference. Beside body aches, cough is most bothersome. Nonproductive cough. Little nasal drainage. Does have mild sore throat he thinks is from post nasal drainage. Has not tested self for COVID at home. Did get influenza vaccine this year.   HPI: HPI  Problems:  Patient Active Problem List   Diagnosis Date Noted   Tremor 12/18/2021   Insomnia 02/27/2021   Weak 02/26/2021   AKI (acute kidney injury) ([place]) 10/04/2020   Acute diarrhea 10/04/2020   Emesis 10/04/2020   Syncope 10/04/2020   Hypotension 10/04/2020   Hyponatremia 10/04/2020   Leukocytosis 10/04/2020   History of colon resection 10/04/2020   Single subsegmental pulmonary embolism without acute cor pulmonale ([place]) 08/24/2020   Lumbar radiculopathy 08/04/2020   Recurrent major depressive disorder, in partial remission ([place]) 08/12/2019   Asthma 06/10/2018   Incomplete emptying of bladder  12/01/2015   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   H/O renal calculi 04/26/2015   Benign prostatic hyperplasia with urinary obstruction 04/26/2015   Migraines 01/13/2015   Ulcerative colitis ([place]) 01/13/2015   Adenoma of pituitary ([facility]) 06/16/2014   Allergic rhinitis 12/16/2013   Benign essential HTN 12/16/2013   HLD (hyperlipidemia) 12/16/2013    Allergies:  Allergies   Allergen Reactions   Sulfa Antibiotics Hives    Increased HR and BP   Elemental Sulfur Rash and Other (See Comments)    Elevated BP and heart rate   Medications:  Current Outpatient Medications:    benzonatate (TESSALON) 100 MG capsule, Take 1 capsule (100 mg total) by mouth 2 (two) times daily as needed for cough., Disp: 20 capsule, Rfl: 0   oseltamivir (TAMIFLU) 75 MG capsule, Take 1 capsule (75 mg total) by mouth 2 (two) times daily., Disp: 10 capsule, Rfl: 0   albuterol (VENTOLIN HFA) 108 (90 Base) MCG/ACT inhaler, Inhale 2 puffs into the lungs every 6 (six) hours as needed for wheezing or shortness of breath., Disp: 18 g, Rfl: 1   amLODipine (NORVASC) 10 MG tablet, TAKE 1 TABLET(10 MG) BY MOUTH DAILY, Disp: 90 tablet, Rfl: 1   EPINEPHrine 0.3 mg/0.3 mL IJ SOAJ injection, Inject 0.3 mLs (0.3 mg total) into the muscle as needed for anaphylaxis., Disp: 1 each, Rfl: 1   lisinopril (ZESTRIL) 40 MG tablet, Take 1 tablet (40 mg total) by mouth daily., Disp: 90 tablet, Rfl: 3   simvastatin (ZOCOR) 20 MG tablet, TAKE 1 TABLET(20 MG) BY MOUTH DAILY AT 6 PM, Disp: 90 tablet, Rfl: 3   SUMAtriptan (IMITREX) 100 MG tablet, Take 1 tablet (100 mg total) by mouth every 2 (two) hours as needed for migraine. May repeat in 2 hours if headache persists or recurs., Disp: 15 tablet, Rfl: 3  Observations/Objective: Patient is well-developed, well-nourished in no acute distress.  Resting comfortably  at home.  Head is normocephalic, atraumatic.  No labored breathing. Occasional coughing spells.  Speech is clear and coherent with logical content.  Patient is alert and oriented at baseline.    Assessment and Plan: 1. Influenza-like illness  Pt will test self for covid at home. Influenza is also a possibility. Discussed usefulness of tamiflu after 3 days of symptoms - will rx tamiflu due to health history. Reviewed reasons for seeking f/u care.   Follow Up Instructions: I discussed the assessment and  treatment plan with the patient. The patient was provided an opportunity to ask questions and all were answered. The patient agreed with the plan and demonstrated an understanding of the instructions.  A copy of instructions were sent to the patient via MyChart unless otherwise noted below.   The patient was advised to call back or seek an in-person evaluation if the symptoms worsen or if the condition fails to improve as anticipated.  Time:  I spent 12 minutes with the patient via telehealth technology discussing the above problems/concerns.    Carvel Getting, NP

## 2022-04-22 NOTE — Patient Instructions (Signed)
Joel Terry, thank you for joining Carvel Getting, NP for today's virtual visit.  While this provider is not your primary care provider (PCP), if your PCP is located in our provider database this encounter information will be shared with them immediately following your visit.   [facility] account gives you access to today's visit and all your visits, tests, and labs performed at [facility] " click here if you don't have a [facility] account or go to [email or web address]  Consent: [name] provided verbal consent for this virtual visit at the beginning of the encounter.  Current Medications:  Current Outpatient Medications:    benzonatate (TESSALON) 100 MG capsule, Take 1 capsule (100 mg total) by mouth 2 (two) times daily as needed for cough., Disp: 20 capsule, Rfl: 0   oseltamivir (TAMIFLU) 75 MG capsule, Take 1 capsule (75 mg total) by mouth 2 (two) times daily., Disp: 10 capsule, Rfl: 0   albuterol (VENTOLIN HFA) 108 (90 Base) MCG/ACT inhaler, Inhale 2 puffs into the lungs every 6 (six) hours as needed for wheezing or shortness of breath., Disp: 18 g, Rfl: 1   amLODipine (NORVASC) 10 MG tablet, TAKE 1 TABLET(10 MG) BY MOUTH DAILY, Disp: 90 tablet, Rfl: 1   EPINEPHrine 0.3 mg/0.3 mL IJ SOAJ injection, Inject 0.3 mLs (0.3 mg total) into the muscle as needed for anaphylaxis., Disp: 1 each, Rfl: 1   lisinopril (ZESTRIL) 40 MG tablet, Take 1 tablet (40 mg total) by mouth daily., Disp: 90 tablet, Rfl: 3   simvastatin (ZOCOR) 20 MG tablet, TAKE 1 TABLET(20 MG) BY MOUTH DAILY AT 6 PM, Disp: 90 tablet, Rfl: 3   SUMAtriptan (IMITREX) 100 MG tablet, Take 1 tablet (100 mg total) by mouth every 2 (two) hours as needed for migraine. May repeat in 2 hours if headache persists or recurs., Disp: 15 tablet, Rfl: 3   Medications ordered in this encounter:  Meds ordered this encounter  Medications   albuterol (VENTOLIN HFA) 108 (90 Base) MCG/ACT inhaler     Sig: Inhale 2 puffs into the lungs every 6 (six) hours as needed for wheezing or shortness of breath.    Dispense:  18 g    Refill:  1   benzonatate (TESSALON) 100 MG capsule    Sig: Take 1 capsule (100 mg total) by mouth 2 (two) times daily as needed for cough.    Dispense:  20 capsule    Refill:  0   oseltamivir (TAMIFLU) 75 MG capsule    Sig: Take 1 capsule (75 mg total) by mouth 2 (two) times daily.    Dispense:  10 capsule    Refill:  0     *If you need refills on other medications prior to your next appointment, please contact your pharmacy*  Follow-Up: Call back or seek an in-person evaluation if the symptoms worsen or if the condition fails to improve as anticipated.  [facility] [phone]  Other Instructions Continue using Mucinex. You might find saline nasal spray to be helpful in getting any head congestion to drain.   Use your albuterol inhaler at least twice a day for now to see if it helps.   Rest! Your body is working hard to fight off this infection.    If you have been instructed to have an in-person evaluation today at a local Urgent Care facility, please use the link below. It will take you to a list of all of our  available [facility], including address, phone number and hours of operation. Please do not delay care.  [facility]  If you or a family member do not have a primary care provider, use the link below to schedule a visit and establish care. When you choose a [facility] primary care physician or advanced practice provider, you gain a long-term partner in health. Find a Primary Care Provider  Learn more about [facility] in-office and virtual care options: [facility]

## 2022-04-25 ENCOUNTER — Ambulatory Visit: Payer: BC Managed Care – PPO | Admitting: Physician Assistant

## 2022-04-25 ENCOUNTER — Encounter: Payer: Self-pay | Admitting: Physician Assistant

## 2022-04-25 VITALS — BP 123/86 | HR 96 | Resp 16 | Wt 233.8 lb

## 2022-04-25 DIAGNOSIS — J209 Acute bronchitis, unspecified: Secondary | ICD-10-CM

## 2022-04-25 MED ORDER — AZELASTINE HCL 0.1 % NA SOLN: 1.0000 | Freq: Two times a day (BID) | NASAL | 1 refills | Status: DC

## 2022-04-25 MED ORDER — PREDNISONE 20 MG PO TABS: 20.0000 mg | ORAL_TABLET | Freq: Every day | ORAL | 0 refills | Status: DC

## 2022-04-25 NOTE — Progress Notes (Signed)
I,Joseline E Rosas,acting as a scribe for [employer], PA-C.,have documented all relevant documentation on the behalf of Mikey Kirschner, PA-C,as directed by  Mikey Kirschner, PA-C while in the presence of Mikey Kirschner, PA-C.  Established patient visit   Patient: Joel Terry   DOB: 1965/05/20   57 y.o. Male  MRN: [id number] Visit Date: 04/25/2022  Today's healthcare provider: Mikey Kirschner, PA-C   Chief Complaint  Patient presents with   URI   Subjective     Pt reports nasal congestion, PND, rhinorrhea, ear fullness, SOB, wheezing and a cough x 7 days. Pt had a virtual visit 04/22/22 who prescribed benzonatate and tamiflu proactively. Pt reports a negative covid test within the last week. Reports his wife is also sick. Denies current fever, body aches.  He has taken nyquil and used his albuterol inhaler several times.  Medications: Outpatient Medications Prior to Visit  Medication Sig   albuterol (VENTOLIN HFA) 108 (90 Base) MCG/ACT inhaler Inhale 2 puffs into the lungs every 6 (six) hours as needed for wheezing or shortness of breath.   amLODipine (NORVASC) 10 MG tablet TAKE 1 TABLET(10 MG) BY MOUTH DAILY   benzonatate (TESSALON) 100 MG capsule Take 1 capsule (100 mg total) by mouth 2 (two) times daily as needed for cough.   EPINEPHrine 0.3 mg/0.3 mL IJ SOAJ injection Inject 0.3 mLs (0.3 mg total) into the muscle as needed for anaphylaxis.   lisinopril (ZESTRIL) 40 MG tablet Take 1 tablet (40 mg total) by mouth daily.   oseltamivir (TAMIFLU) 75 MG capsule Take 1 capsule (75 mg total) by mouth 2 (two) times daily.   simvastatin (ZOCOR) 20 MG tablet TAKE 1 TABLET(20 MG) BY MOUTH DAILY AT 6 PM   SUMAtriptan (IMITREX) 100 MG tablet Take 1 tablet (100 mg total) by mouth every 2 (two) hours as needed for migraine. May repeat in 2 hours if headache persists or recurs.   No facility-administered medications prior to visit.   Review of Systems  Constitutional:  Negative for  fever.  HENT:  Positive for congestion, postnasal drip, rhinorrhea, sinus pressure and sinus pain. Negative for ear pain and sore throat.   Respiratory:  Positive for cough, chest tightness, shortness of breath and wheezing.   Cardiovascular:  Negative for chest pain.     Objective    BP 123/86 (BP Location: Right Arm, Patient Position: Sitting, Cuff Size: Large)   Pulse 96   Resp 16   Wt 233 lb 12.8 oz (106.1 kg)   SpO2 98%   BMI 35.55 kg/m   Physical Exam Constitutional:      General: He is awake.     Appearance: He is well-developed.  HENT:     Head: Normocephalic.     Right Ear: Tympanic membrane normal.     Left Ear: Tympanic membrane normal.  Eyes:     Conjunctiva/sclera: Conjunctivae normal.  Cardiovascular:     Rate and Rhythm: Normal rate and regular rhythm.     Heart sounds: Normal heart sounds.  Pulmonary:     Effort: Pulmonary effort is normal.     Breath sounds: Normal breath sounds. No stridor. No wheezing, rhonchi or rales.  Skin:    General: Skin is warm.  Neurological:     Mental Status: He is alert and oriented to person, place, and time.  Psychiatric:        Attention and Perception: Attention normal.        Mood and Affect: Mood  normal.        Speech: Speech normal.        Behavior: Behavior is cooperative.    No results found for any visits on 04/25/22.  Assessment & Plan     Acute bronchitis D/t increased albuterol use and benefit pt is having from inhaler --  Rx prednisone 20 mg x 5 days Rx azelastine for PND If any return of fever, increased shortness of breath please call office for further recommendations Return if symptoms worsen or fail to improve.     I, Mikey Kirschner, PA-C have reviewed all documentation for this visit. The documentation on  04/25/22 for the exam, diagnosis, procedures, and orders are all accurate and complete.  Mikey Kirschner, PA-C [facility] [address] [place], [place],  [address] Office: [phone] Fax: [place]

## 2022-05-09 ENCOUNTER — Other Ambulatory Visit: Payer: Self-pay | Admitting: Physician Assistant

## 2022-05-09 ENCOUNTER — Other Ambulatory Visit: Payer: Self-pay | Admitting: Family Medicine

## 2022-05-09 DIAGNOSIS — E7800 Pure hypercholesterolemia, unspecified: Secondary | ICD-10-CM

## 2022-05-09 DIAGNOSIS — I10 Essential (primary) hypertension: Secondary | ICD-10-CM

## 2022-05-09 MED ORDER — LISINOPRIL 40 MG PO TABS: 40.0000 mg | ORAL_TABLET | Freq: Every day | ORAL | 0 refills | Status: DC

## 2022-05-09 NOTE — Telephone Encounter (Signed)
[facility] faxed refill request for the following medications:   lisinopril (ZESTRIL) 40 MG tablet    Please advise

## 2022-05-10 ENCOUNTER — Other Ambulatory Visit: Payer: Self-pay | Admitting: Family Medicine

## 2022-05-10 DIAGNOSIS — E7800 Pure hypercholesterolemia, unspecified: Secondary | ICD-10-CM

## 2022-05-14 ENCOUNTER — Ambulatory Visit
Admission: RE | Admit: 2022-05-14 | Discharge: 2022-05-14 | Disposition: A | Discharge status: Home / Self Care | Payer: BC Managed Care – PPO | Attending: Physician Assistant | Admitting: Physician Assistant

## 2022-05-14 ENCOUNTER — Ambulatory Visit: Payer: BC Managed Care – PPO | Admitting: Physician Assistant

## 2022-05-14 ENCOUNTER — Encounter: Payer: Self-pay | Admitting: Physician Assistant

## 2022-05-14 ENCOUNTER — Ambulatory Visit
Admission: RE | Admit: 2022-05-14 | Discharge: 2022-05-14 | Disposition: A | Discharge status: Home / Self Care | Payer: BC Managed Care – PPO | Source: Ambulatory Visit | Attending: Physician Assistant | Admitting: Physician Assistant

## 2022-05-14 VITALS — BP 132/101 | HR 97 | Ht 68.0 in | Wt 236.4 lb

## 2022-05-14 DIAGNOSIS — R059 Cough, unspecified: Secondary | ICD-10-CM | POA: Diagnosis not present

## 2022-05-14 DIAGNOSIS — J209 Acute bronchitis, unspecified: Secondary | ICD-10-CM | POA: Insufficient documentation

## 2022-05-14 DIAGNOSIS — R051 Acute cough: Secondary | ICD-10-CM | POA: Diagnosis not present

## 2022-05-14 MED ORDER — NEBULIZER MISC: 0 refills | Status: AC

## 2022-05-14 MED ORDER — ALBUTEROL SULFATE (2.5 MG/3ML) 0.083% IN NEBU: 2.5000 mg | INHALATION_SOLUTION | Freq: Four times a day (QID) | RESPIRATORY_TRACT | 1 refills | Status: AC | PRN

## 2022-05-14 MED ORDER — NEBULIZER MASK ADULT MISC: 0 refills | Status: AC

## 2022-05-14 MED ORDER — AMOXICILLIN 500 MG PO CAPS: 1000.0000 mg | ORAL_CAPSULE | Freq: Three times a day (TID) | ORAL | 0 refills | Status: AC

## 2022-05-14 NOTE — Addendum Note (Signed)
Addended byMikey Kirschner on: 05/14/2022 04:26 PM   Modules accepted: Orders

## 2022-05-14 NOTE — Progress Notes (Addendum)
I,Sha'taria Tyson,acting as a Education administrator for [employer], PA-C.,have documented all relevant documentation on the behalf of Mikey Kirschner, PA-C,as directed by  Mikey Kirschner, PA-C while in the presence of Mikey Kirschner, PA-C.   Established patient visit   Patient: Joel Terry   DOB: Aug 06, 1965   57 y.o. Male  MRN: [id number] Visit Date: 05/14/2022  Today's healthcare provider: Mikey Kirschner, PA-C   Cc. Persistent cough  Subjective    HPI  Follow up for acute bronchitis  The patient was last seen for this 19 days ago. Changes made at last visit include Rx prednisone 20 mg x 5 days Rx azelastine for PND.  He reports fair compliance with treatment. He feels that condition is  started getting better, but symptoms returned starting Saturday  -- significant cough, weakness, fatigue. Reports after a long coughing spell he will have tunnel vision and vertigo for a few seconds to a few minutes.  Denies LOC or syncope.  -----------------------------------------------------------------------------------------   Medications: Outpatient Medications Prior to Visit  Medication Sig   albuterol (VENTOLIN HFA) 108 (90 Base) MCG/ACT inhaler Inhale 2 puffs into the lungs every 6 (six) hours as needed for wheezing or shortness of breath.   amLODipine (NORVASC) 10 MG tablet TAKE 1 TABLET(10 MG) BY MOUTH DAILY   azelastine (ASTELIN) 0.1 % nasal spray Place 1 spray into both nostrils 2 (two) times daily. Use in each nostril as directed   EPINEPHrine 0.3 mg/0.3 mL IJ SOAJ injection Inject 0.3 mLs (0.3 mg total) into the muscle as needed for anaphylaxis.   lisinopril (ZESTRIL) 40 MG tablet Take 1 tablet (40 mg total) by mouth daily.   simvastatin (ZOCOR) 20 MG tablet TAKE 1 TABLET(20 MG) BY MOUTH DAILY AT 6 PM   SUMAtriptan (IMITREX) 100 MG tablet Take 1 tablet (100 mg total) by mouth every 2 (two) hours as needed for migraine. May repeat in 2 hours if headache persists or recurs.    [DISCONTINUED] benzonatate (TESSALON) 100 MG capsule Take 1 capsule (100 mg total) by mouth 2 (two) times daily as needed for cough.   predniSONE (DELTASONE) 20 MG tablet Take 1 tablet (20 mg total) by mouth daily with breakfast.   [DISCONTINUED] oseltamivir (TAMIFLU) 75 MG capsule Take 1 capsule (75 mg total) by mouth 2 (two) times daily.   No facility-administered medications prior to visit.    Review of Systems  Constitutional:  Positive for fatigue.  HENT:  Positive for congestion, postnasal drip, rhinorrhea, sinus pressure, sinus pain, sneezing and sore throat.   Respiratory:  Positive for cough, shortness of breath and wheezing.   Neurological:  Positive for headaches.      Objective    Blood pressure (!) 136/101, pulse 85, height '5\' 8"'$  (1.727 m), weight 236 lb 6.4 oz (107.2 kg), SpO2 99 %.   Physical Exam Constitutional:      General: He is awake.     Appearance: He is well-developed.  HENT:     Head: Normocephalic.  Eyes:     Conjunctiva/sclera: Conjunctivae normal.  Cardiovascular:     Rate and Rhythm: Normal rate and regular rhythm.     Heart sounds: Normal heart sounds.  Pulmonary:     Effort: Pulmonary effort is normal.     Breath sounds: Examination of the left-upper field reveals rhonchi. Examination of the left-middle field reveals rhonchi. Rhonchi present. No wheezing or rales.  Skin:    General: Skin is warm.  Neurological:     Mental Status:  He is alert and oriented to person, place, and time.  Psychiatric:        Attention and Perception: Attention normal.        Mood and Affect: Mood normal.        Speech: Speech normal.        Behavior: Behavior is cooperative.     No results found for any visits on 05/14/22.  Assessment & Plan     Acute bronchitis 2. Cough Ordered chest xray r/o pneumonia, possible rhonchi L Ordered cmp/cbc/ddimer-- pt has history of PE (provoked, recent surgery) 2 years ago. Not tachy, BP elevated but he has been w/o his meds,  O2 97-99% Advised strict ED precautions--syncope, HR > 120 bpm, O2 < 93%  Rx nebulized albuterol  Addendum --  CXR negative for pneumonia  D/t prolonged nature of symptoms and severity-- will treat with amoxicillin 1 g TID x 5 days  Return if symptoms worsen or fail to improve.     I, Mikey Kirschner, PA-C have reviewed all documentation for this visit. The documentation on 05/14/22  for the exam, diagnosis, procedures, and orders are all accurate and complete.  Mikey Kirschner, PA-C [facility] [address] [place], [place], [address] Office: [phone] Fax: [place]

## 2022-05-15 LAB — CBC WITH DIFFERENTIAL/PLATELET
Basophils Absolute: 0.1 10*3/uL (ref 0.0–0.2)
Basos: 1 %
EOS (ABSOLUTE): 0.1 10*3/uL (ref 0.0–0.4)
Eos: 2 %
Hematocrit: 45.1 % (ref 37.5–51.0)
Hemoglobin: 15.4 g/dL (ref 13.0–17.7)
Immature Grans (Abs): 0 10*3/uL (ref 0.0–0.1)
Immature Granulocytes: 0 %
Lymphocytes Absolute: 1.6 10*3/uL (ref 0.7–3.1)
Lymphs: 21 %
MCH: 30.8 pg (ref 26.6–33.0)
MCHC: 34.1 g/dL (ref 31.5–35.7)
MCV: 90 fL (ref 79–97)
Monocytes Absolute: 0.8 10*3/uL (ref 0.1–0.9)
Monocytes: 11 %
Neutrophils Absolute: 5.2 10*3/uL (ref 1.4–7.0)
Neutrophils: 65 %
Platelets: 422 10*3/uL (ref 150–450)
RBC: 5 x10E6/uL (ref 4.14–5.80)
RDW: 12.5 % (ref 11.6–15.4)
WBC: 7.8 10*3/uL (ref 3.4–10.8)

## 2022-05-15 LAB — COMPREHENSIVE METABOLIC PANEL
ALT: 21 IU/L (ref 0–44)
AST: 22 IU/L (ref 0–40)
Albumin/Globulin Ratio: 1.5 (ref 1.2–2.2)
Albumin: 4.4 g/dL (ref 3.8–4.9)
Alkaline Phosphatase: 91 IU/L (ref 44–121)
BUN/Creatinine Ratio: 13 (ref 9–20)
BUN: 16 mg/dL (ref 6–24)
Bilirubin Total: 0.4 mg/dL (ref 0.0–1.2)
CO2: 23 mmol/L (ref 20–29)
Calcium: 9.7 mg/dL (ref 8.7–10.2)
Chloride: 101 mmol/L (ref 96–106)
Creatinine, Ser: 1.21 mg/dL (ref 0.76–1.27)
Globulin, Total: 2.9 g/dL (ref 1.5–4.5)
Glucose: 91 mg/dL (ref 70–99)
Potassium: 4.4 mmol/L (ref 3.5–5.2)
Sodium: 137 mmol/L (ref 134–144)
Total Protein: 7.3 g/dL (ref 6.0–8.5)
eGFR: 71 mL/min/{1.73_m2} (ref >59)

## 2022-05-15 LAB — D-DIMER, QUANTITATIVE: D-DIMER: 0.3 mg/L FEU (ref 0.00–0.49)

## 2022-06-05 ENCOUNTER — Other Ambulatory Visit: Payer: Self-pay | Admitting: Nurse Practitioner

## 2022-06-05 NOTE — Telephone Encounter (Signed)
Requested medication (s) are due for refill today -provider review   Requested medication (s) are on the active medication list -yes  Future visit scheduled -no  Last refill: 04/22/22 18g 1RF  Notes to clinic: outside provider Rx  Requested Prescriptions  Pending Prescriptions Disp Refills   albuterol (VENTOLIN HFA) 108 (90 Base) MCG/ACT inhaler [Pharmacy Med Name: ALBUTEROL HFA INH (200 PUFFS) 8.5GM] 8.5 g     Sig: INHALE 2 PUFFS INTO THE LUNGS EVERY 6 HOURS AS NEEDED FOR WHEEZING OR SHORTNESS OF BREATH     Pulmonology:  Beta Agonists 2 Failed - 06/05/2022  8:09 AM      Failed - Last BP in normal range    BP Readings from Last 1 Encounters:  05/14/22 (!) 132/101         Passed - Last Heart Rate in normal range    Pulse Readings from Last 1 Encounters:  05/14/22 97         Passed - Valid encounter within last 12 months    Recent Outpatient Visits           3 weeks ago Acute bronchitis, unspecified organism   [place] Mikey Kirschner, PA-C   1 month ago Acute bronchitis, unspecified organism   [facility] [place], [place], PA-C   5 months ago Benign essential HTN   [place], [place], PA-C   6 months ago Hyperlipidemia, unspecified hyperlipidemia type   [facility] Mikey Kirschner, PA-C   7 months ago Benign essential HTN   [place] Mikey Kirschner, PA-C                 Requested Prescriptions  Pending Prescriptions Disp Refills   albuterol (VENTOLIN HFA) 108 (90 Base) MCG/ACT inhaler [Pharmacy Med Name: ALBUTEROL HFA INH (200 PUFFS) 8.5GM] 8.5 g     Sig: INHALE 2 PUFFS INTO THE LUNGS EVERY 6 HOURS AS NEEDED FOR WHEEZING OR SHORTNESS OF BREATH     Pulmonology:  Beta Agonists 2 Failed - 06/05/2022  8:09 AM      Failed - Last BP in normal range    BP Readings from Last 1 Encounters:  05/14/22 (!) 132/101          Passed - Last Heart Rate in normal range    Pulse Readings from Last 1 Encounters:  05/14/22 97         Passed - Valid encounter within last 12 months    Recent Outpatient Visits           3 weeks ago Acute bronchitis, unspecified organism   [facility] Mikey Kirschner, PA-C   1 month ago Acute bronchitis, unspecified organism   [facility] [place], [place], PA-C   5 months ago Benign essential HTN   [place], [place], PA-C   6 months ago Hyperlipidemia, unspecified hyperlipidemia type   [facility] Mikey Kirschner, PA-C   7 months ago Benign essential HTN   [place] Mikey Kirschner, [place]

## 2022-08-09 ENCOUNTER — Other Ambulatory Visit: Payer: Self-pay | Admitting: Physician Assistant

## 2022-08-09 DIAGNOSIS — I10 Essential (primary) hypertension: Secondary | ICD-10-CM

## 2022-11-09 ENCOUNTER — Other Ambulatory Visit: Payer: Self-pay | Admitting: Physician Assistant

## 2022-11-09 DIAGNOSIS — I10 Essential (primary) hypertension: Secondary | ICD-10-CM

## 2022-12-23 ENCOUNTER — Telehealth (INDEPENDENT_AMBULATORY_CARE_PROVIDER_SITE_OTHER): Payer: BC Managed Care – PPO | Admitting: Family Medicine

## 2022-12-23 ENCOUNTER — Encounter: Payer: Self-pay | Admitting: Family Medicine

## 2022-12-23 DIAGNOSIS — J0140 Acute pansinusitis, unspecified: Secondary | ICD-10-CM | POA: Diagnosis not present

## 2022-12-23 MED ORDER — AMOXICILLIN-POT CLAVULANATE 875-125 MG PO TABS: 1.0000 | ORAL_TABLET | Freq: Two times a day (BID) | ORAL | 0 refills | Status: AC

## 2022-12-23 NOTE — Progress Notes (Signed)
MyChart Video Visit    Virtual Visit via Video Note   This format is felt to be most appropriate for this patient at this time. Physical exam was limited by quality of the video and audio technology used for the visit.    Patient location: home Provider location: [facility] Persons involved in the visit: patient, provider   I discussed the limitations of evaluation and management by telemedicine and the availability of in person appointments. The patient expressed understanding and agreed to proceed.  Patient: Joel Terry   DOB: Dec 11, 1965   57 y.o. Male  MRN: [id number] Visit Date: 12/23/2022  Today's healthcare provider: Shirlee Latch, MD   Chief Complaint  Patient presents with   URI   Subjective    URI    HPI     URI   Associated symptoms inlclude congestion, cough and wheezing.  Onset: For 10 days.  The problem has been gradually worsening since onset.  The temperature has been with in normal range.  Patient  is drinking moderate amounts of fluids.  Past hisotry is significant for  asthma.      Last edited by Marjie Skiff, CMA on 12/23/2022  8:42 AM.      Discussed the use of AI scribe software for clinical note transcription with the patient, who gave verbal consent to proceed.  History of Present Illness   The patient, with a history of intermittent asthma, presents with a 10-day history of a head cold that has not improved. He describes the symptoms as not debilitating, but persistent, with significant head congestion. The cold has triggered his asthma, leading to increased coughing, which occasionally causes lightheadedness. However, he reports that the cough is not chesty and is more related to the head cold. The patient's family members have also been ill with similar symptoms. His asthma is usually well-controlled and only flares up a few times a year or when he is unwell. He reports that he is currently breathing fine, but his head  feels very congested.        Medications: Outpatient Medications Prior to Visit  Medication Sig   albuterol (PROVENTIL) (2.5 MG/3ML) 0.083% nebulizer solution Take 3 mLs (2.5 mg total) by nebulization every 6 (six) hours as needed for wheezing or shortness of breath.   albuterol (VENTOLIN HFA) 108 (90 Base) MCG/ACT inhaler INHALE 2 PUFFS INTO THE LUNGS EVERY 6 HOURS AS NEEDED FOR WHEEZING OR SHORTNESS OF BREATH   amLODipine (NORVASC) 10 MG tablet TAKE 1 TABLET(10 MG) BY MOUTH DAILY   azelastine (ASTELIN) 0.1 % nasal spray Place 1 spray into both nostrils 2 (two) times daily. Use in each nostril as directed   EPINEPHrine 0.3 mg/0.3 mL IJ SOAJ injection Inject 0.3 mLs (0.3 mg total) into the muscle as needed for anaphylaxis.   lisinopril (ZESTRIL) 40 MG tablet TAKE 1 TABLET(40 MG) BY MOUTH DAILY   Nebulizer MISC Use with prescribed solution q 6 hours as needed   Respiratory Therapy Supplies (NEBULIZER MASK ADULT) MISC Use with prescribed solution q 6 hours as needed   simvastatin (ZOCOR) 20 MG tablet TAKE 1 TABLET(20 MG) BY MOUTH DAILY AT 6 PM   SUMAtriptan (IMITREX) 100 MG tablet Take 1 tablet (100 mg total) by mouth every 2 (two) hours as needed for migraine. May repeat in 2 hours if headache persists or recurs.   predniSONE (DELTASONE) 20 MG tablet Take 1 tablet (20 mg total) by mouth daily with breakfast.   No  facility-administered medications prior to visit.    Review of Systems per HPI      Objective    There were no vitals taken for this visit.      Physical Exam Constitutional:      General: He is not in acute distress.    Appearance: Normal appearance. He is not diaphoretic.  HENT:     Head: Normocephalic.  Eyes:     Conjunctiva/sclera: Conjunctivae normal.  Pulmonary:     Effort: Pulmonary effort is normal. No respiratory distress.  Neurological:     Mental Status: He is alert and oriented to person, place, and time. Mental status is at baseline.         Assessment & Plan     Problem List Items Addressed This Visit   None Visit Diagnoses     Acute non-recurrent pansinusitis    -  Primary   Relevant Medications   amoxicillin-clavulanate (AUGMENTIN) 875-125 MG tablet           Sinusitis Persistent head cold for 10 days with worsening symptoms and sinus congestion. No improvement with conservative management. -Prescribe Augmentin twice daily for 7 days. Take with food and complete the full course.  Asthma Asthma triggered by upper respiratory infection, resulting in coughing fits. No current wheezing or shortness of breath. -Continue current management. No need for prednisone at this time.  Hypertension Last seen for blood pressure management approximately a year ago. Medication likely to run out soon. -Schedule follow-up appointment for blood pressure evaluation.        Return if symptoms worsen or fail to improve.     I discussed the assessment and treatment plan with the patient. The patient was provided an opportunity to ask questions and all were answered. The patient agreed with the plan and demonstrated an understanding of the instructions.   The patient was advised to call back or seek an in-person evaluation if the symptoms worsen or if the condition fails to improve as anticipated.   Shirlee Latch, MD [facility] [phone] (phone) [phone] (fax)  [facility]

## 2023-02-10 ENCOUNTER — Other Ambulatory Visit: Payer: Self-pay | Admitting: Family Medicine

## 2023-02-10 DIAGNOSIS — I10 Essential (primary) hypertension: Secondary | ICD-10-CM

## 2023-02-14 ENCOUNTER — Other Ambulatory Visit: Payer: Self-pay | Admitting: Physician Assistant

## 2023-02-14 ENCOUNTER — Other Ambulatory Visit: Payer: Self-pay | Admitting: Family Medicine

## 2023-02-14 DIAGNOSIS — I10 Essential (primary) hypertension: Secondary | ICD-10-CM

## 2023-02-14 DIAGNOSIS — E7800 Pure hypercholesterolemia, unspecified: Secondary | ICD-10-CM

## 2023-04-16 ENCOUNTER — Other Ambulatory Visit: Payer: Self-pay

## 2023-04-16 ENCOUNTER — Emergency Department: Payer: 59

## 2023-04-16 ENCOUNTER — Observation Stay
Admission: EM | Admit: 2023-04-16 | Discharge: 2023-04-18 | Disposition: A | Discharge status: Home / Self Care | Payer: 59 | Attending: Student | Admitting: Student

## 2023-04-16 DIAGNOSIS — I959 Hypotension, unspecified: Secondary | ICD-10-CM | POA: Diagnosis not present

## 2023-04-16 DIAGNOSIS — R531 Weakness: Secondary | ICD-10-CM | POA: Diagnosis not present

## 2023-04-16 DIAGNOSIS — K529 Noninfective gastroenteritis and colitis, unspecified: Principal | ICD-10-CM | POA: Diagnosis present

## 2023-04-16 DIAGNOSIS — N189 Chronic kidney disease, unspecified: Secondary | ICD-10-CM | POA: Insufficient documentation

## 2023-04-16 DIAGNOSIS — N179 Acute kidney failure, unspecified: Secondary | ICD-10-CM | POA: Diagnosis not present

## 2023-04-16 DIAGNOSIS — E66811 Obesity, class 1: Secondary | ICD-10-CM | POA: Diagnosis not present

## 2023-04-16 DIAGNOSIS — E785 Hyperlipidemia, unspecified: Secondary | ICD-10-CM | POA: Diagnosis not present

## 2023-04-16 DIAGNOSIS — Z79899 Other long term (current) drug therapy: Secondary | ICD-10-CM | POA: Diagnosis not present

## 2023-04-16 DIAGNOSIS — I129 Hypertensive chronic kidney disease with stage 1 through stage 4 chronic kidney disease, or unspecified chronic kidney disease: Secondary | ICD-10-CM | POA: Diagnosis not present

## 2023-04-16 DIAGNOSIS — R55 Syncope and collapse: Principal | ICD-10-CM

## 2023-04-16 DIAGNOSIS — R112 Nausea with vomiting, unspecified: Secondary | ICD-10-CM

## 2023-04-16 DIAGNOSIS — Z9889 Other specified postprocedural states: Secondary | ICD-10-CM | POA: Insufficient documentation

## 2023-04-16 DIAGNOSIS — R197 Diarrhea, unspecified: Secondary | ICD-10-CM

## 2023-04-16 DIAGNOSIS — I10 Essential (primary) hypertension: Secondary | ICD-10-CM | POA: Insufficient documentation

## 2023-04-16 DIAGNOSIS — G43909 Migraine, unspecified, not intractable, without status migrainosus: Secondary | ICD-10-CM | POA: Diagnosis present

## 2023-04-16 DIAGNOSIS — R61 Generalized hyperhidrosis: Secondary | ICD-10-CM | POA: Diagnosis not present

## 2023-04-16 DIAGNOSIS — J45909 Unspecified asthma, uncomplicated: Secondary | ICD-10-CM | POA: Insufficient documentation

## 2023-04-16 DIAGNOSIS — Z1152 Encounter for screening for COVID-19: Secondary | ICD-10-CM | POA: Diagnosis not present

## 2023-04-16 LAB — HEPATIC FUNCTION PANEL
ALT: 29 U/L (ref 0–44)
AST: 26 U/L (ref 15–41)
Albumin: 4.3 g/dL (ref 3.5–5.0)
Alkaline Phosphatase: 66 U/L (ref 38–126)
Bilirubin, Direct: 0.2 mg/dL (ref 0.0–0.2)
Indirect Bilirubin: 0.7 mg/dL (ref 0.3–0.9)
Total Bilirubin: 0.9 mg/dL (ref 0.0–1.2)
Total Protein: 7.4 g/dL (ref 6.5–8.1)

## 2023-04-16 LAB — TROPONIN I (HIGH SENSITIVITY): Troponin I (High Sensitivity): 5 ng/L (ref <18)

## 2023-04-16 LAB — CBC
HCT: 50.5 % (ref 39.0–52.0)
Hemoglobin: 17.6 g/dL — ABNORMAL HIGH (ref 13.0–17.0)
MCH: 31.1 pg (ref 26.0–34.0)
MCHC: 34.9 g/dL (ref 30.0–36.0)
MCV: 89.2 fL (ref 80.0–100.0)
Platelets: 399 10*3/uL (ref 150–400)
RBC: 5.66 MIL/uL (ref 4.22–5.81)
RDW: 13.6 % (ref 11.5–15.5)
WBC: 21.2 10*3/uL — ABNORMAL HIGH (ref 4.0–10.5)
nRBC: 0 % (ref 0.0–0.2)

## 2023-04-16 LAB — BASIC METABOLIC PANEL
Anion gap: 10 (ref 5–15)
BUN: 28 mg/dL — ABNORMAL HIGH (ref 6–20)
CO2: 23 mmol/L (ref 22–32)
Calcium: 9 mg/dL (ref 8.9–10.3)
Chloride: 102 mmol/L (ref 98–111)
Creatinine, Ser: 1.7 mg/dL — ABNORMAL HIGH (ref 0.61–1.24)
GFR, Estimated: 47 mL/min — ABNORMAL LOW (ref >60)
Glucose, Bld: 147 mg/dL — ABNORMAL HIGH (ref 70–99)
Potassium: 4.8 mmol/L (ref 3.5–5.1)
Sodium: 135 mmol/L (ref 135–145)

## 2023-04-16 LAB — MAGNESIUM: Magnesium: 2.2 mg/dL (ref 1.7–2.4)

## 2023-04-16 LAB — LACTIC ACID, PLASMA: Lactic Acid, Venous: 1.9 mmol/L (ref 0.5–1.9)

## 2023-04-16 LAB — LIPASE, BLOOD: Lipase: 29 U/L (ref 11–51)

## 2023-04-16 MED ORDER — IOHEXOL 300 MG/ML  SOLN
100.0000 mL | Freq: Once | INTRAMUSCULAR | Status: AC | PRN
  Administered 2023-04-16: 100 mL via INTRAVENOUS

## 2023-04-16 MED ORDER — SODIUM CHLORIDE 0.9 % IV BOLUS
500.0000 mL | Freq: Once | INTRAVENOUS | Status: AC
  Administered 2023-04-16: 500 mL via INTRAVENOUS

## 2023-04-16 NOTE — ED Notes (Signed)
 Pt transported to Xray at this time.

## 2023-04-16 NOTE — ED Provider Notes (Signed)
 [facility] Provider Note    Event Date/Time   First MD Initiated Contact with Patient 04/16/23 2209     (approximate)   History   Hypotension   HPI  Joel Terry is a 58 y.o. male with a history of hypertension, hyperlipidemia, asthma, and ulcerative colitis status post colectomy who presents with several episodes of syncope this evening.  The patient states that he has been feeling somewhat lightheaded since late yesterday.  This evening he started to feel more lightheaded and weak.  He then had several episodes of vomiting.  He reports chronic loose stools due to his colectomy, but they had been looser than normal this evening.  He had some abdominal pain earlier but this has resolved.  The patient states subsequently felt very lightheaded and then passed out at least twice.  When EMS arrived they noted he was hypotensive.  Blood pressure is now improved after some fluids, and the patient states he is feeling significantly better.  I reviewed the past medical records.  The patient has no recent ED visits or admissions.  He was most recently evaluated by family medicine on 9/9 for a URI.   Physical Exam   Triage Vital Signs: ED Triage Vitals  Encounter Vitals Group     BP 04/16/23 2203 111/72     Systolic BP Percentile --      Diastolic BP Percentile --      Pulse Rate 04/16/23 2203 78     Resp 04/16/23 2203 20     Temp 04/16/23 2208 98.3 F (36.8 C)     Temp Source 04/16/23 2208 Rectal     SpO2 04/16/23 2203 98 %     Weight 04/16/23 2204 224 lb 13.9 oz (102 kg)     Height 04/16/23 2204 5' 8 (1.727 m)     Head Circumference --      Peak Flow --      Pain Score 04/16/23 2204 0     Pain Loc --      Pain Education --      Exclude from Growth Chart --     Most recent vital signs: Vitals:   04/16/23 2245 04/16/23 2330  BP:  113/82  Pulse: 94 (!) 105  Resp: 13 17  Temp:    SpO2: 96% 98%     General: Awake, no distress.  CV:  Good  peripheral perfusion.  Resp:  Normal effort.  Abd:  Soft and nontender.  No distention.  Other:  Somewhat dry mucous membranes.   ED Results / Procedures / Treatments   Labs (all labs ordered are listed, but only abnormal results are displayed) Labs Reviewed  BASIC METABOLIC PANEL - Abnormal; Notable for the following components:      Result Value   Glucose, Bld 147 (*)    BUN 28 (*)    Creatinine, Ser 1.70 (*)    GFR, Estimated 47 (*)    All other components within normal limits  CBC - Abnormal; Notable for the following components:   WBC 21.2 (*)    Hemoglobin 17.6 (*)    All other components within normal limits  RESP PANEL BY RT-PCR (RSV, FLU A&B, COVID)  RVPGX2  HEPATIC FUNCTION PANEL  LIPASE, BLOOD  MAGNESIUM   LACTIC ACID, PLASMA  LACTIC ACID, PLASMA  URINALYSIS, ROUTINE W REFLEX MICROSCOPIC  CBG MONITORING, ED  TROPONIN I (HIGH SENSITIVITY)     EKG  ED ECG REPORT I, Waylon Cassis, the attending  physician, personally viewed and interpreted this ECG.  Date: 04/16/2023 EKG Time: 2200 Rate: 76 Rhythm: normal sinus rhythm QRS Axis: normal Intervals: normal ST/T Wave abnormalities: Nonspecific ST abnormalities Narrative Interpretation: no evidence of acute ischemia    RADIOLOGY  Chest x-ray: I independently viewed and interpreted the images; there is no focal consolidation or edema  CT abdomen/pelvis: Pending   PROCEDURES:  Critical Care performed: No  Procedures   MEDICATIONS ORDERED IN ED: Medications  sodium chloride  0.9 % bolus 500 mL (500 mLs Intravenous New Bag/Given 04/16/23 2247)  iohexol  (OMNIPAQUE ) 300 MG/ML solution 100 mL (100 mLs Intravenous Contrast Given 04/16/23 2342)     IMPRESSION / MDM / ASSESSMENT AND PLAN / ED COURSE  I reviewed the triage vital signs and the nursing notes.  58 year old male with PMH as noted above presents with lightheadedness since yesterday, now with vomiting, loose stools, increasing weakness and  syncope.  He was found to be hypotensive by EMS but this resolved after 500 mL of fluids.  On exam the patient is overall relatively well-appearing.  Vital signs are normal currently.  Abdomen is soft and nontender.  Mucous membranes are somewhat dry.  Differential diagnosis includes, but is not limited to, gastroenteritis or other viral syndrome with resulting dehydration, acute colitis, diverticulitis, other acute intra-abdominal infection.  We will obtain chest x-ray, CT abdomen/pelvis, lab workup, give fluids, and reassess.  Patient's presentation is most consistent with acute presentation with potential threat to life or bodily function.  The patient is on the cardiac monitor to evaluate for evidence of arrhythmia and/or significant heart rate changes.  ----------------------------------------- 11:46 PM on 04/16/2023 -----------------------------------------  WBC count is 21.  Lactate is somewhat elevated.  LFTs and lipase are normal.  Troponin is negative.  CT is pending.  Have signed the patient out to the oncoming ED physician Dr. Neomi.  FINAL CLINICAL IMPRESSION(S) / ED DIAGNOSES   Final diagnoses:  Syncope, unspecified syncope type  Hypotension, unspecified hypotension type     Rx / DC Orders   ED Discharge Orders     None        Note:  This document was prepared using Dragon voice recognition software and may include unintentional dictation errors.    Jacolyn Pae, MD 04/16/23 [phone]

## 2023-04-16 NOTE — ED Triage Notes (Addendum)
 Pt to ED via [facility] from home for diarrhea x3 days and began vomiting today.  Pt also reports he began feeling lightheaded over 3 days intermittently. Pt has hx of large intestine removal. Per EMS pt was 89% on RA upon arrival. EMS reports pt has been having chills today.  Pt reports vomiting once today. Per EMS pt was hypotensive on arrival at 76/40. EMS gave 500ml NS in route. EMS reports most recent BP 96/60. Pt states he was on abx for URI one week ago.   EMS vitals BP 96/60 70 HR  SPO2 95% on 2L [place]

## 2023-04-16 NOTE — ED Notes (Signed)
 Pt alert, iv fluids infusing.   Labs sent  dr siadecki in with pt and family.

## 2023-04-17 DIAGNOSIS — R55 Syncope and collapse: Secondary | ICD-10-CM

## 2023-04-17 DIAGNOSIS — K529 Noninfective gastroenteritis and colitis, unspecified: Principal | ICD-10-CM | POA: Diagnosis present

## 2023-04-17 DIAGNOSIS — E785 Hyperlipidemia, unspecified: Secondary | ICD-10-CM

## 2023-04-17 DIAGNOSIS — N179 Acute kidney failure, unspecified: Secondary | ICD-10-CM

## 2023-04-17 DIAGNOSIS — I10 Essential (primary) hypertension: Secondary | ICD-10-CM | POA: Insufficient documentation

## 2023-04-17 LAB — RESP PANEL BY RT-PCR (RSV, FLU A&B, COVID)  RVPGX2
Influenza A by PCR: NEGATIVE
Influenza B by PCR: NEGATIVE
Resp Syncytial Virus by PCR: NEGATIVE
SARS Coronavirus 2 by RT PCR: NEGATIVE

## 2023-04-17 LAB — BASIC METABOLIC PANEL
Anion gap: 8 (ref 5–15)
BUN: 28 mg/dL — ABNORMAL HIGH (ref 6–20)
CO2: 20 mmol/L — ABNORMAL LOW (ref 22–32)
Calcium: 7.9 mg/dL — ABNORMAL LOW (ref 8.9–10.3)
Chloride: 106 mmol/L (ref 98–111)
Creatinine, Ser: 1.29 mg/dL — ABNORMAL HIGH (ref 0.61–1.24)
GFR, Estimated: >60 mL/min (ref >60)
Glucose, Bld: 123 mg/dL — ABNORMAL HIGH (ref 70–99)
Potassium: 4.4 mmol/L (ref 3.5–5.1)
Sodium: 134 mmol/L — ABNORMAL LOW (ref 135–145)

## 2023-04-17 LAB — URINALYSIS, ROUTINE W REFLEX MICROSCOPIC
Bacteria, UA: NONE SEEN
Bilirubin Urine: NEGATIVE
Glucose, UA: NEGATIVE mg/dL
Hgb urine dipstick: NEGATIVE
Ketones, ur: NEGATIVE mg/dL
Leukocytes,Ua: NEGATIVE
Nitrite: NEGATIVE
Protein, ur: 30 mg/dL — AB
Specific Gravity, Urine: >1.046 — ABNORMAL HIGH (ref 1.005–1.030)
pH: 5 (ref 5.0–8.0)

## 2023-04-17 LAB — GASTROINTESTINAL PANEL BY PCR, STOOL (REPLACES STOOL CULTURE)

## 2023-04-17 LAB — CBC
HCT: 43.9 % (ref 39.0–52.0)
Hemoglobin: 15.3 g/dL (ref 13.0–17.0)
MCH: 31.7 pg (ref 26.0–34.0)
MCHC: 34.9 g/dL (ref 30.0–36.0)
MCV: 90.9 fL (ref 80.0–100.0)
Platelets: 347 10*3/uL (ref 150–400)
RBC: 4.83 MIL/uL (ref 4.22–5.81)
RDW: 13.6 % (ref 11.5–15.5)
WBC: 16.2 10*3/uL — ABNORMAL HIGH (ref 4.0–10.5)
nRBC: 0 % (ref 0.0–0.2)

## 2023-04-17 LAB — C DIFFICILE QUICK SCREEN W PCR REFLEX
C Diff antigen: NEGATIVE
C Diff interpretation: NOT DETECTED
C Diff toxin: NEGATIVE

## 2023-04-17 LAB — HIV ANTIBODY (ROUTINE TESTING W REFLEX): HIV Screen 4th Generation wRfx: NONREACTIVE

## 2023-04-17 LAB — TROPONIN I (HIGH SENSITIVITY): Troponin I (High Sensitivity): 4 ng/L (ref <18)

## 2023-04-17 LAB — LACTIC ACID, PLASMA: Lactic Acid, Venous: 1.6 mmol/L (ref 0.5–1.9)

## 2023-04-17 LAB — PHOSPHORUS: Phosphorus: 2.2 mg/dL — ABNORMAL LOW (ref 2.5–4.6)

## 2023-04-17 MED ORDER — ACETAMINOPHEN 650 MG RE SUPP: 650.0000 mg | Freq: Four times a day (QID) | RECTAL | Status: DC | PRN

## 2023-04-17 MED ORDER — MAGNESIUM HYDROXIDE 400 MG/5ML PO SUSP
30.0000 mL | Freq: Every day | ORAL | Status: DC | PRN
Start: 2023-04-17 — End: 2023-04-18

## 2023-04-17 MED ORDER — ACETAMINOPHEN 325 MG PO TABS: 650.0000 mg | ORAL_TABLET | Freq: Four times a day (QID) | ORAL | Status: DC | PRN

## 2023-04-17 MED ORDER — HYDRALAZINE HCL 50 MG PO TABS: 50.0000 mg | ORAL_TABLET | Freq: Four times a day (QID) | ORAL | Status: DC | PRN

## 2023-04-17 MED ORDER — LISINOPRIL 10 MG PO TABS: 40.0000 mg | ORAL_TABLET | Freq: Every day | ORAL | Status: DC

## 2023-04-17 MED ORDER — AMLODIPINE BESYLATE 5 MG PO TABS: 10.0000 mg | ORAL_TABLET | Freq: Every day | ORAL | Status: DC

## 2023-04-17 MED ORDER — TRAZODONE HCL 50 MG PO TABS: 25.0000 mg | ORAL_TABLET | Freq: Every evening | ORAL | Status: DC | PRN

## 2023-04-17 MED ORDER — SODIUM CHLORIDE 0.9 % IV BOLUS (SEPSIS)
1000.0000 mL | Freq: Once | INTRAVENOUS | Status: AC
Start: 2023-04-17 — End: 2023-04-17
  Administered 2023-04-17: 1000 mL via INTRAVENOUS

## 2023-04-17 MED ORDER — ALBUTEROL SULFATE (2.5 MG/3ML) 0.083% IN NEBU: 2.5000 mg | INHALATION_SOLUTION | Freq: Four times a day (QID) | RESPIRATORY_TRACT | Status: DC | PRN

## 2023-04-17 MED ORDER — SUMATRIPTAN SUCCINATE 50 MG PO TABS: 100.0000 mg | ORAL_TABLET | ORAL | Status: DC | PRN

## 2023-04-17 MED ORDER — SODIUM CHLORIDE 0.9 % IV SOLN: INTRAVENOUS | Status: AC

## 2023-04-17 MED ORDER — SIMVASTATIN 20 MG PO TABS: 20.0000 mg | ORAL_TABLET | Freq: Every day | ORAL | Status: DC

## 2023-04-17 MED ORDER — ONDANSETRON HCL 4 MG PO TABS: 4.0000 mg | ORAL_TABLET | Freq: Four times a day (QID) | ORAL | Status: DC | PRN

## 2023-04-17 MED ORDER — PANTOPRAZOLE SODIUM 40 MG PO TBEC
40.0000 mg | DELAYED_RELEASE_TABLET | Freq: Every day | ORAL | Status: DC
  Administered 2023-04-17 – 2023-04-18 (×2): 40 mg via ORAL
  Filled 2023-04-17 (×2): qty 1

## 2023-04-17 MED ORDER — ENOXAPARIN SODIUM 60 MG/0.6ML IJ SOSY
0.5000 mg/kg | PREFILLED_SYRINGE | INTRAMUSCULAR | Status: DC
Start: 2023-04-17 — End: 2023-04-18
  Administered 2023-04-17 – 2023-04-18 (×2): 50 mg via SUBCUTANEOUS
  Filled 2023-04-17 (×2): qty 0.6

## 2023-04-17 MED ORDER — ONDANSETRON HCL 4 MG/2ML IJ SOLN: 4.0000 mg | Freq: Four times a day (QID) | INTRAMUSCULAR | Status: DC | PRN

## 2023-04-17 MED ORDER — EPINEPHRINE 0.3 MG/0.3ML IJ SOAJ: 0.3000 mg | INTRAMUSCULAR | Status: DC | PRN

## 2023-04-17 MED ORDER — AMLODIPINE BESYLATE 10 MG PO TABS
10.0000 mg | ORAL_TABLET | Freq: Every day | ORAL | Status: DC
  Administered 2023-04-18: 10 mg via ORAL
  Filled 2023-04-17: qty 1

## 2023-04-17 NOTE — ED Notes (Signed)
 Pt reports that their eyes have been watering and their nose has been itching since they've been in the ED. Pt reports that it has gotten more noticeable today, and that their eyes are constantly watering now, which is causing their eyes to be red and a little swollen from wiping their eyes a lot.

## 2023-04-17 NOTE — Assessment & Plan Note (Signed)
-   This likely secondary to volume depletion from recurrent diarrhea and possibly neurally mediated given his nausea. - We will monitor his orthostatics. - We will continue hydration as mentioned above. - At this time I do not believe he needs any further workup for syncope.

## 2023-04-17 NOTE — Assessment & Plan Note (Signed)
-   This is clearly prerenal due to volume depletion and dehydration from recurrent diarrhea. - We will continue hydration with IV normal saline and hold off nephrotoxins. - Will follow BMP.

## 2023-04-17 NOTE — Progress Notes (Signed)
 Approximately 1800--Pt arrived to room 144 from ED. Upon arrival, pt A&O x 4 and VS obtained. Full skin assessment completed by this RN. Pt accompanied by wife, at bedside. Cardiac Telemetry initiated. All fall precautions in place.

## 2023-04-17 NOTE — ED Notes (Signed)
 Pt ambulated to toilet in the hallway without assistance from staff and tolerated activity well.

## 2023-04-17 NOTE — Assessment & Plan Note (Signed)
-   The patient be admitted to a medical telemetry observation bed. - We will continue hydration with IV normal saline. - As needed antiemetics will be provided. - Given recent antibiotic treatment, stool C. difficile was sent. - Stool pathogens panel was also sent.

## 2023-04-17 NOTE — Assessment & Plan Note (Signed)
-   We will continue his triptan therapy.

## 2023-04-17 NOTE — Plan of Care (Signed)
  Problem: Health Behavior/Discharge Planning: Goal: Ability to manage health-related needs will improve Outcome: Progressing   Problem: Education: Goal: Knowledge of General Education information will improve Description: Including pain rating scale, medication(s)/side effects and non-pharmacologic comfort measures Outcome: Progressing   Problem: Clinical Measurements: Goal: Respiratory complications will improve Outcome: Progressing   Problem: Clinical Measurements: Goal: Cardiovascular complication will be avoided Outcome: Progressing   Problem: Elimination: Goal: Will not experience complications related to bowel motility Outcome: Progressing   Problem: Elimination: Goal: Will not experience complications related to urinary retention Outcome: Progressing   Problem: Pain Management: Goal: General experience of comfort will improve Outcome: Progressing   Problem: Safety: Goal: Ability to remain free from injury will improve Outcome: Progressing

## 2023-04-17 NOTE — Plan of Care (Signed)

## 2023-04-17 NOTE — ED Notes (Signed)
 Report off to cpod nurse.  Pt moved to cpod.

## 2023-04-17 NOTE — Plan of Care (Signed)
 Patient was seen and examined at bedside.  Presented with nausea vomiting and diarrhea for past 3 days.  Patient had 5 episodes of diarrhea yesterday and 1 episode today morning.  No abdominal pain. WBC count 16.2, creatinine 1.29 still elevated.,  Continue IV fluid for hydration plan for disposition tomorrow a.m. if remains stable.

## 2023-04-17 NOTE — Assessment & Plan Note (Signed)
 -  We will continue statin therapy.

## 2023-04-17 NOTE — Assessment & Plan Note (Signed)
-   We will continue antihypertensive therapy while holding off nephrotoxins.

## 2023-04-17 NOTE — H&P (Signed)
 [place]   PATIENT NAME: Joel Terry    MR#:  [id number]  DATE OF BIRTH:  1966-01-10  DATE OF ADMISSION:  04/16/2023  PRIMARY CARE PHYSICIAN: [facility]   Patient is coming from: Home  REQUESTING/REFERRING PHYSICIAN: Ward, Josette SAILOR, DO  CHIEF COMPLAINT:   Chief Complaint  Patient presents with   Hypotension    HISTORY OF PRESENT ILLNESS:  Joel Terry is a 58 y.o. male with medical history significant for asthma, hypertension, ulcerative colitis and nephrolithiasis, presented to the emergency room with acute onset of diarrhea started yesterday with loose and watery bowel movements as well as mild abdominal cramps with diarrhea.  He denied any melena or bright red bleeding per rectum.  He admitted to nausea and vomiting with his diarrhea.  No fever or chills.  He had several syncopal episodes that he described as fainting.  No falls or head injuries.  No dysuria, oliguria or hematuria or flank pain.  No chest pain or palpitations.  No cough or wheezing or dyspnea.  No paresthesias or focal muscle weakness.  He was just recently treated with amoxicillin  for an upper respiratory infection around Christmas time.  ED Course: When he came to the ER, respiratory rate was 25 with otherwise normal vital signs.  Pulse symmetry was 90% on room air and 98% on 2 L of O2 by nasal cannula.  Labs revealed a BUN of 28 and creatinine 1.7 above previous normal levels and a blood glucose of 147 with otherwise unremarkable CMP.  Lactic acid was 1.9 and CBC showed leukocytosis 21.2 with hemoconcentration.  Respiratory panel came back negative. EKG as reviewed by me : She should no sinus rhythm with rate of 76 with poor R wave progression. Imaging: Two-view chest x-ray showed no acute cardiopulmonary disease.  The patient was given 1.5 L IV normal saline bolus.  He will be admitted to a medical telemetry observation bed for further evaluation and management. PAST MEDICAL HISTORY:    Past Medical History:  Diagnosis Date   Asthma    Chronic kidney disease    Headache    History of kidney stones    Hypertension    Nephrolithiasis    Obesity    Ulcerative colitis (HCC)     PAST SURGICAL HISTORY:   Past Surgical History:  Procedure Laterality Date   COLECTOMY     COLONOSCOPY WITH PROPOFOL  N/A 09/16/2014   Procedure: COLONOSCOPY WITH PROPOFOL ;  Surgeon: Gladis RAYMOND Mariner, MD;  Location: [facility] ENDOSCOPY;  Service: Endoscopy;  Laterality: N/A;   FLEXIBLE SIGMOIDOSCOPY N/A 07/11/2017   Procedure: FLEXIBLE SIGMOIDOSCOPY;  Surgeon: Mariner Gladis RAYMOND, MD;  Location: [facility] ENDOSCOPY;  Service: Endoscopy;  Laterality: N/A;   FLEXIBLE SIGMOIDOSCOPY N/A 10/23/2017   Procedure: FLEXIBLE SIGMOIDOSCOPY;  Surgeon: Mariner Gladis RAYMOND, MD;  Location: [facility] ENDOSCOPY;  Service: Endoscopy;  Laterality: N/A;   FLEXIBLE SIGMOIDOSCOPY N/A 08/06/2019   Procedure: FLEXIBLE SIGMOIDOSCOPY;  Surgeon: Therisa Bi, MD;  Location: [facility] ENDOSCOPY;  Service: Gastroenterology;  Laterality: N/A;   HERNIA REPAIR      SOCIAL HISTORY:   Social History   Tobacco Use   Smoking status: Never   Smokeless tobacco: Never  Substance Use Topics   Alcohol use: Yes    Alcohol/week: 3.0 standard drinks of alcohol    Types: 3 Cans of beer per week    Comment: Occassional    FAMILY HISTORY:  History reviewed. No pertinent family history.  DRUG ALLERGIES:  Allergies  Allergen Reactions   Sulfa Antibiotics Hives    Increased HR and BP   Elemental Sulfur Rash and Other (See Comments)    Elevated BP and heart rate    REVIEW OF SYSTEMS:   ROS As per history of present illness. All pertinent systems were reviewed above. Constitutional, HEENT, cardiovascular, respiratory, GI, GU, musculoskeletal, neuro, psychiatric, endocrine, integumentary and hematologic systems were reviewed and are otherwise negative/unremarkable except for positive findings mentioned above in the HPI.   MEDICATIONS AT HOME:    Prior to Admission medications   Medication Sig Start Date End Date Taking? Authorizing Provider  albuterol  (PROVENTIL ) (2.5 MG/3ML) 0.083% nebulizer solution Take 3 mLs (2.5 mg total) by nebulization every 6 (six) hours as needed for wheezing or shortness of breath. 05/14/22   Cyndi Shaver, PA-C  albuterol  (VENTOLIN  HFA) 108 (90 Base) MCG/ACT inhaler INHALE 2 PUFFS INTO THE LUNGS EVERY 6 HOURS AS NEEDED FOR WHEEZING OR SHORTNESS OF BREATH 06/11/22   Cyndi, Shaver, PA-C  amLODipine  (NORVASC ) 10 MG tablet TAKE 1 TABLET(10 MG) BY MOUTH DAILY 03/19/22   Drubel, Shaver, PA-C  azelastine  (ASTELIN ) 0.1 % nasal spray Place 1 spray into both nostrils 2 (two) times daily. Use in each nostril as directed 04/25/22   Drubel, Shaver, PA-C  EPINEPHrine  0.3 mg/0.3 mL IJ SOAJ injection Inject 0.3 mLs (0.3 mg total) into the muscle as needed for anaphylaxis. 11/25/19   Pollak, Adriana M, PA-C  lisinopril  (ZESTRIL ) 40 MG tablet TAKE 1 TABLET(40 MG) BY MOUTH DAILY 02/10/23   Simmons-Robinson, Rockie, MD  Nebulizer MISC Use with prescribed solution q 6 hours as needed 05/14/22   Drubel, Shaver, PA-C  predniSONE  (DELTASONE ) 20 MG tablet Take 1 tablet (20 mg total) by mouth daily with breakfast. 04/25/22   Cyndi Shaver, PA-C  Respiratory Therapy Supplies (NEBULIZER MASK ADULT) MISC Use with prescribed solution q 6 hours as needed 05/14/22   Drubel, Shaver, PA-C  simvastatin  (ZOCOR ) 20 MG tablet TAKE 1 TABLET(20 MG) BY MOUTH DAILY AT 6 PM 05/09/22   Drubel, Shaver, PA-C  SUMAtriptan  (IMITREX ) 100 MG tablet Take 1 tablet (100 mg total) by mouth every 2 (two) hours as needed for migraine. May repeat in 2 hours if headache persists or recurs. 08/12/19   Vivienne Delon HERO, PA-C      VITAL SIGNS:  Blood pressure 108/68, pulse 72, temperature 98.2 F (36.8 C), temperature source Oral, resp. rate 20, height 5' 8 (1.727 m), weight 102 kg, SpO2 98%.  PHYSICAL EXAMINATION:  Physical Exam  GENERAL:  58  y.o.-year-old patient lying in the bed with no acute distress.  EYES: Pupils equal, round, reactive to light and accommodation. No scleral icterus. Extraocular muscles intact.  HEENT: Head atraumatic, normocephalic. Oropharynx and nasopharynx clear.  NECK:  Supple, no jugular venous distention. No thyroid enlargement, no tenderness.  LUNGS: Normal breath sounds bilaterally, no wheezing, rales,rhonchi or crepitation. No use of accessory muscles of respiration.  CARDIOVASCULAR: Regular rate and rhythm, S1, S2 normal. No murmurs, rubs, or gallops.  ABDOMEN: Soft, nondistended, nontender. Bowel sounds present. No organomegaly or mass.  EXTREMITIES: No pedal edema, cyanosis, or clubbing.  NEUROLOGIC: Cranial nerves II through XII are intact. Muscle strength 5/5 in all extremities. Sensation intact. Gait not checked.  PSYCHIATRIC: The patient is alert and oriented x 3.  Normal affect and good eye contact. SKIN: No obvious rash, lesion, or ulcer.   LABORATORY PANEL:   CBC Recent Labs  Lab 04/17/23 0324  WBC 16.2*  HGB 15.3  HCT 43.9  PLT 347   ------------------------------------------------------------------------------------------------------------------  Chemistries  Recent Labs  Lab 04/16/23 2215 04/17/23 0324  NA 135 134*  K 4.8 4.4  CL 102 106  CO2 23 20*  GLUCOSE 147* 123*  BUN 28* 28*  CREATININE 1.70* 1.29*  CALCIUM 9.0 7.9*  MG 2.2  --   AST 26  --   ALT 29  --   ALKPHOS 66  --   BILITOT 0.9  --    ------------------------------------------------------------------------------------------------------------------  Cardiac Enzymes No results for input(s): TROPONINI in the last 168 hours. ------------------------------------------------------------------------------------------------------------------  RADIOLOGY:  CT ABDOMEN PELVIS W CONTRAST Result Date: 04/17/2023 CLINICAL DATA:  Diarrhea EXAM: CT ABDOMEN AND PELVIS WITH CONTRAST TECHNIQUE: Multidetector CT  imaging of the abdomen and pelvis was performed using the standard protocol following bolus administration of intravenous contrast. RADIATION DOSE REDUCTION: This exam was performed according to the departmental dose-optimization program which includes automated exposure control, adjustment of the mA and/or kV according to patient size and/or use of iterative reconstruction technique. CONTRAST:  OMNIPAQUE  IOHEXOL  300 MG/ML  SOLN COMPARISON:  CT abdomen pelvis 10/04/2020, CT abdomen pelvis 06/02/2019 FINDINGS: Lower chest: No acute abnormality. Hepatobiliary: No focal liver abnormality. No gallstones, gallbladder wall thickening, or pericholecystic fluid. No biliary dilatation. Pancreas: No focal lesion. Normal pancreatic contour. No surrounding inflammatory changes. No main pancreatic ductal dilatation. Spleen: Normal in size without focal abnormality. Adrenals/Urinary Tract: No adrenal nodule bilaterally. Bilateral kidneys enhance symmetrically. Fluid density parapelvic and parenchymal lesions likely represent simple renal cysts. Simple renal cysts, in the absence of clinically indicated signs/symptoms, require no independent follow-up. No hydronephrosis. No hydroureter.  6 mm left nephrolithiasis. The urinary bladder is unremarkable. On delayed imaging, there is no urothelial wall thickening and there are no filling defects in the opacified portions of the bilateral collecting systems or ureters. Stomach/Bowel: Colectomy with distal small bowel and rectal anastomosis. Grossly similar-appearing distal small bowel distension in the setting of surgical changes. Fluid noted within the lumen of the distal small bowel and at the small rectal anastomosis. Stomach is within normal limits. No evidence of bowel wall thickening or dilatation. Appendix appears normal. Vascular/Lymphatic: No abdominal aorta or iliac aneurysm. No abdominal, pelvic, or inguinal lymphadenopathy. Reproductive: Prostate is unremarkable.  Other: No intraperitoneal free fluid. No intraperitoneal free gas. No organized fluid collection. Musculoskeletal: No abdominal wall hernia or abnormality. No suspicious lytic or blastic osseous lesions. No acute displaced fracture. L5-S1 intervertebral disc space narrowing and posterior disc osteophyte complex formation. IMPRESSION: 1. Nonobstructive 6 mm left nephrolithiasis. 2. Diarrheal state. 3. Grossly similar-appearing distal small bowel distension in the setting of colectomy with distal small bowel and rectal anastomosis. No definite bowel obstruction. 4.  Aortic Atherosclerosis (ICD10-I70.0). Electronically Signed   By: Morgane  Naveau M.D.   On: 04/17/2023 00:02   DG Chest 2 View Result Date: 04/16/2023 CLINICAL DATA:  Hypoxia, diarrhea for 3 days, vomiting EXAM: CHEST - 2 VIEW COMPARISON:  05/14/2022 FINDINGS: Frontal and lateral views of the chest demonstrate an unremarkable cardiac silhouette. No acute airspace disease, effusion, or pneumothorax. No acute bony abnormalities. IMPRESSION: 1. No acute intrathoracic process. Electronically Signed   By: Ozell Daring M.D.   On: 04/16/2023 23:12      IMPRESSION AND PLAN:  Assessment and Plan: * Acute gastroenteritis - The patient be admitted to a medical telemetry observation bed. - We will continue hydration with IV normal saline. - As needed antiemetics will be provided. - Given recent antibiotic treatment, stool C. difficile  was sent. - Stool pathogens panel was also sent.   Recurrent syncope - This likely secondary to volume depletion from recurrent diarrhea and possibly neurally mediated given his nausea. - We will monitor his orthostatics. - We will continue hydration as mentioned above. - At this time I do not believe he needs any further workup for syncope.  AKI (acute kidney injury) (HCC) - This is clearly prerenal due to volume depletion and dehydration from recurrent diarrhea. - We will continue hydration with IV normal  saline and hold off nephrotoxins. - Will follow BMP.  Dyslipidemia - We will continue statin therapy.  Essential hypertension - We will continue antihypertensive therapy while holding off nephrotoxins..  Migraines - We will continue his triptan therapy.   DVT prophylaxis: Lovenox .  Advanced Care Planning:  Code Status: full code.  Family Communication:  The plan of care was discussed in details with the patient (and family). I answered all questions. The patient agreed to proceed with the above mentioned plan. Further management will depend upon hospital course. Disposition Plan: Back to previous home environment Consults called: none.  All the records are reviewed and case discussed with ED provider.  Status is: Observation    At the time of the admission, it appears that the appropriate admission status for this patient is inpatient.  This is judged to be reasonable and necessary in order to provide the required intensity of service to ensure the patient's safety given the presenting symptoms, physical exam findings and initial radiographic and laboratory data in the context of comorbid conditions.  The patient requires inpatient status due to high intensity of service, high risk of further deterioration and high frequency of surveillance required.  I certify that at the time of admission, it is my clinical judgment that the patient will require hospital care extending less than 2 midnights.                            Dispo: The patient is from: Home              Anticipated d/c is to: Home              Patient currently is not medically stable to d/c.              Difficult to place patient: No  Madison DELENA Peaches M.D on 04/17/2023 at 5:07 AM  [facility]   From 7 PM-7 AM, contact night-coverage [email or web address]  CC: Primary care physician; [facility]

## 2023-04-17 NOTE — ED Notes (Signed)
 Hospitalist at bedside

## 2023-04-17 NOTE — Progress Notes (Signed)
 Anticoagulation monitoring(Lovenox ):  58 yo male ordered Lovenox  40 mg Q24h    Filed Weights   04/16/23 2204  Weight: 102 kg (224 lb 13.9 oz)   BMI 34.2    Lab Results  Component Value Date   CREATININE 1.70 (H) 04/16/2023   CREATININE 1.21 05/14/2022   CREATININE 1.09 11/15/2021   Estimated Creatinine Clearance: 56.8 mL/min (A) (by C-G formula based on SCr of 1.7 mg/dL (H)). Hemoglobin & Hematocrit     Component Value Date/Time   HGB 17.6 (H) 04/16/2023 2215   HGB 15.4 05/14/2022 1542   HCT 50.5 04/16/2023 2215   HCT 45.1 05/14/2022 1542     Per Protocol for Patient with estCrcl > 30 ml/min and BMI > 30, will transition to Lovenox  50 mg Q24h.

## 2023-04-17 NOTE — ED Provider Notes (Signed)
 12:22 AM  Assumed care at shift change.  Patient here with nausea, vomiting and diarrhea.  Likely viral gastroenteritis.  Has mild AKI.  CT of the abdomen pelvis was pending for further evaluation.  Patient has no bowel obstruction, appendicitis.  He does report he has been on amoxicillin  recently.  Will obtain stool studies.  Patient is getting IV hydration given several syncopal events at home likely due from hypovolemia.    Consulted and discussed patient's case with hospitalist, Dr. Lawence.  I have recommended admission and consulting physician agrees and will place admission orders.  Patient (and family if present) agree with this plan.   I reviewed all nursing notes, vitals, pertinent previous records.  All labs, EKGs, imaging ordered have been independently reviewed and interpreted by myself.    Ingri Diemer, Josette SAILOR, DO 04/17/23 [phone]

## 2023-04-18 DIAGNOSIS — K529 Noninfective gastroenteritis and colitis, unspecified: Secondary | ICD-10-CM | POA: Diagnosis not present

## 2023-04-18 LAB — BASIC METABOLIC PANEL
Anion gap: 8 (ref 5–15)
BUN: 16 mg/dL (ref 6–20)
CO2: 20 mmol/L — ABNORMAL LOW (ref 22–32)
Calcium: 8.5 mg/dL — ABNORMAL LOW (ref 8.9–10.3)
Chloride: 109 mmol/L (ref 98–111)
Creatinine, Ser: 0.94 mg/dL (ref 0.61–1.24)
GFR, Estimated: >60 mL/min (ref >60)
Glucose, Bld: 96 mg/dL (ref 70–99)
Potassium: 4.2 mmol/L (ref 3.5–5.1)
Sodium: 137 mmol/L (ref 135–145)

## 2023-04-18 LAB — CBC
HCT: 44.7 % (ref 39.0–52.0)
Hemoglobin: 15.2 g/dL (ref 13.0–17.0)
MCH: 31 pg (ref 26.0–34.0)
MCHC: 34 g/dL (ref 30.0–36.0)
MCV: 91.2 fL (ref 80.0–100.0)
Platelets: 355 10*3/uL (ref 150–400)
RBC: 4.9 MIL/uL (ref 4.22–5.81)
RDW: 13.9 % (ref 11.5–15.5)
WBC: 6.7 10*3/uL (ref 4.0–10.5)
nRBC: 0 % (ref 0.0–0.2)

## 2023-04-18 LAB — MAGNESIUM: Magnesium: 2.1 mg/dL (ref 1.7–2.4)

## 2023-04-18 LAB — PHOSPHORUS: Phosphorus: 2 mg/dL — ABNORMAL LOW (ref 2.5–4.6)

## 2023-04-18 MED ORDER — K PHOS MONO-SOD PHOS DI & MONO 155-852-130 MG PO TABS
500.0000 mg | ORAL_TABLET | Freq: Four times a day (QID) | ORAL | Status: DC
  Administered 2023-04-18: 500 mg via ORAL
  Filled 2023-04-18: qty 2

## 2023-04-18 MED ORDER — SODIUM BICARBONATE 650 MG PO TABS
650.0000 mg | ORAL_TABLET | Freq: Once | ORAL | Status: AC
  Administered 2023-04-18: 650 mg via ORAL
  Filled 2023-04-18: qty 1

## 2023-04-18 NOTE — Discharge Summary (Signed)
 [facility] Discharge Summary   Patient: Joel Terry FMW:[id number]  PCP: [facility]  Date of admission: 04/16/2023   Date of discharge:  04/18/2023     Discharge Diagnoses:  Principal Problem:   Acute gastroenteritis Active Problems:   Recurrent syncope   AKI (acute kidney injury) (HCC)   Dyslipidemia   Migraines   Essential hypertension   Admitted From: Home Disposition:  Home   Recommendations for Outpatient Follow-up:  PCP: in 1 wk Follow up LABS/TEST:     Follow-up Information     [facility] Follow up in 1 week(s).   Contact information: [address] [place] [address] [phone]                Diet recommendation: Cardiac diet  Activity: The patient is advised to gradually reintroduce usual activities, as tolerated  Discharge Condition: stable  Code Status: Full code   History of present illness: As per the H and P dictated on admission Hospital Course:  Joel Terry is a 58 y.o. male with medical history significant for asthma, hypertension, ulcerative colitis and nephrolithiasis, presented to the emergency room with acute onset of diarrhea started yesterday with loose and watery bowel movements as well as mild abdominal cramps with diarrhea.  He denied any melena or bright red bleeding per rectum.  He admitted to nausea and vomiting with his diarrhea.  No fever or chills.  He had several syncopal episodes that he described as fainting.  No falls or head injuries.  No dysuria, oliguria or hematuria or flank pain.  No chest pain or palpitations.  No cough or wheezing or dyspnea.  No paresthesias or focal muscle weakness.  He was just recently treated with amoxicillin  for an upper respiratory infection around Christmas time.   ED Course: When he came to the ER, respiratory rate was 25 with otherwise normal vital signs.  Pulse symmetry was 90% on room air and 98% on 2 L of O2 by nasal cannula.  Labs  revealed a BUN of 28 and creatinine 1.7 above previous normal levels and a blood glucose of 147 with otherwise unremarkable CMP.  Lactic acid was 1.9 and CBC showed leukocytosis 21.2 with hemoconcentration.  Respiratory panel came back negative. EKG as reviewed by me : She should no sinus rhythm with rate of 76 with poor R wave progression. Imaging: Two-view chest x-ray showed no acute cardiopulmonary disease.   The patient was given 1.5 L IV normal saline bolus.  He will be admitted to a medical telemetry observation bed for further evaluation and management.  Assessment and Plan:  # Acute gastroenteritis, unknown cause, resolved  C. difficile and GI pathogen negative. S/p IV fluid, and symptomatic treatment.  Patient is tolerating diet well.  Diarrhea resolved.  Stable to discharge. # Recurrent syncope, most likely due to volume depletion secondary to diarrhea. Currently patient is asymptomatic, ambulating well without any symptoms. Recommended to monitor BP at home. # AKI (acute kidney injury) Resolved  prerenal secondary to volume depletion due to diarrhea. S/p IV fluid, renal function improved.  AKI resolved. # Dyslipidemia: continue statin therapy. # Essential hypertension: Held Antipressan medications on admission due to low blood pressure, dehydration and recurrent syncope.  BP improved.  Patient was given amlodipine  during hospital stay, held lisinopril  due to AKI.  On discharge resumed lisinopril  40 mg p.o. daily home dose.  AKI resolved.  # h/o Migraines: No active issues.  Patient used to be  on triptan therapy, not anymore.   Obesity class I Body mass index is 34.19 kg/m.  Nutrition Interventions: Calorie restricted diet and daily exercise advised to lose body weight.  Lifestyle modification discussed.  Patient was ambulatory without any assistance. On the day of the discharge the patient's vitals were stable, and no other acute medical condition were reported by patient. the  patient was felt safe to be discharge at Home.  Consultants: None Procedures: None  Discharge Exam: General: Appear in no distress, no Rash; Oral Mucosa Clear, moist. Cardiovascular: S1 and S2 Present, no Murmur, Respiratory: normal respiratory effort, Bilateral Air entry present and no Crackles, no wheezes Abdomen: Bowel Sound present, Soft and no tenderness, no hernia Extremities: no Pedal edema, no calf tenderness Neurology: alert and oriented to time, place, and person affect appropriate.  Filed Weights   04/16/23 2204  Weight: 102 kg   Vitals:   04/17/23 2159 04/18/23 0740  BP: 128/80 (!) 137/100  Pulse: 72 67  Resp: 20 16  Temp: 98.2 F (36.8 C) 98.5 F (36.9 C)  SpO2: 98% 100%    DISCHARGE MEDICATION: Allergies as of 04/18/2023       Reactions   Sulfa Antibiotics Hives   Increased HR and BP   Elemental Sulfur Rash, Other (See Comments)   Elevated BP and heart rate        Medication List     TAKE these medications    albuterol  (2.5 MG/3ML) 0.083% nebulizer solution Commonly known as: PROVENTIL  Take 3 mLs (2.5 mg total) by nebulization every 6 (six) hours as needed for wheezing or shortness of breath.   albuterol  108 (90 Base) MCG/ACT inhaler Commonly known as: VENTOLIN  HFA INHALE 2 PUFFS INTO THE LUNGS EVERY 6 HOURS AS NEEDED FOR WHEEZING OR SHORTNESS OF BREATH   EPINEPHrine  0.3 mg/0.3 mL Soaj injection Commonly known as: EPI-PEN Inject 0.3 mLs (0.3 mg total) into the muscle as needed for anaphylaxis.   lisinopril  40 MG tablet Commonly known as: ZESTRIL  TAKE 1 TABLET(40 MG) BY MOUTH DAILY   Nebulizer Mask Adult Misc Use with prescribed solution q 6 hours as needed   Nebulizer Misc Use with prescribed solution q 6 hours as needed   simvastatin  20 MG tablet Commonly known as: ZOCOR  TAKE 1 TABLET(20 MG) BY MOUTH DAILY AT 6 PM       Allergies  Allergen Reactions   Sulfa Antibiotics Hives    Increased HR and BP   Elemental Sulfur Rash and  Other (See Comments)    Elevated BP and heart rate   Discharge Instructions     Call MD for:   Complete by: As directed    Diarrhea   Call MD for:  difficulty breathing, headache or visual disturbances   Complete by: As directed    Call MD for:  extreme fatigue   Complete by: As directed    Call MD for:  persistant dizziness or light-headedness   Complete by: As directed    Call MD for:  persistant nausea and vomiting   Complete by: As directed    Call MD for:  severe uncontrolled pain   Complete by: As directed    Call MD for:  temperature >100.4   Complete by: As directed    Diet - low sodium heart healthy   Complete by: As directed    Discharge instructions   Complete by: As directed    F/u with PCP in 1 wk   Increase activity slowly   Complete  by: As directed        The results of significant diagnostics from this hospitalization (including imaging, microbiology, ancillary and laboratory) are listed below for reference.    Significant Diagnostic Studies: CT ABDOMEN PELVIS W CONTRAST Result Date: 04/17/2023 CLINICAL DATA:  Diarrhea EXAM: CT ABDOMEN AND PELVIS WITH CONTRAST TECHNIQUE: Multidetector CT imaging of the abdomen and pelvis was performed using the standard protocol following bolus administration of intravenous contrast. RADIATION DOSE REDUCTION: This exam was performed according to the departmental dose-optimization program which includes automated exposure control, adjustment of the mA and/or kV according to patient size and/or use of iterative reconstruction technique. CONTRAST:  OMNIPAQUE  IOHEXOL  300 MG/ML  SOLN COMPARISON:  CT abdomen pelvis 10/04/2020, CT abdomen pelvis 06/02/2019 FINDINGS: Lower chest: No acute abnormality. Hepatobiliary: No focal liver abnormality. No gallstones, gallbladder wall thickening, or pericholecystic fluid. No biliary dilatation. Pancreas: No focal lesion. Normal pancreatic contour. No surrounding inflammatory changes. No main  pancreatic ductal dilatation. Spleen: Normal in size without focal abnormality. Adrenals/Urinary Tract: No adrenal nodule bilaterally. Bilateral kidneys enhance symmetrically. Fluid density parapelvic and parenchymal lesions likely represent simple renal cysts. Simple renal cysts, in the absence of clinically indicated signs/symptoms, require no independent follow-up. No hydronephrosis. No hydroureter.  6 mm left nephrolithiasis. The urinary bladder is unremarkable. On delayed imaging, there is no urothelial wall thickening and there are no filling defects in the opacified portions of the bilateral collecting systems or ureters. Stomach/Bowel: Colectomy with distal small bowel and rectal anastomosis. Grossly similar-appearing distal small bowel distension in the setting of surgical changes. Fluid noted within the lumen of the distal small bowel and at the small rectal anastomosis. Stomach is within normal limits. No evidence of bowel wall thickening or dilatation. Appendix appears normal. Vascular/Lymphatic: No abdominal aorta or iliac aneurysm. No abdominal, pelvic, or inguinal lymphadenopathy. Reproductive: Prostate is unremarkable. Other: No intraperitoneal free fluid. No intraperitoneal free gas. No organized fluid collection. Musculoskeletal: No abdominal wall hernia or abnormality. No suspicious lytic or blastic osseous lesions. No acute displaced fracture. L5-S1 intervertebral disc space narrowing and posterior disc osteophyte complex formation. IMPRESSION: 1. Nonobstructive 6 mm left nephrolithiasis. 2. Diarrheal state. 3. Grossly similar-appearing distal small bowel distension in the setting of colectomy with distal small bowel and rectal anastomosis. No definite bowel obstruction. 4.  Aortic Atherosclerosis (ICD10-I70.0). Electronically Signed   By: Morgane  Naveau M.D.   On: 04/17/2023 00:02   DG Chest 2 View Result Date: 04/16/2023 CLINICAL DATA:  Hypoxia, diarrhea for 3 days, vomiting EXAM: CHEST - 2  VIEW COMPARISON:  05/14/2022 FINDINGS: Frontal and lateral views of the chest demonstrate an unremarkable cardiac silhouette. No acute airspace disease, effusion, or pneumothorax. No acute bony abnormalities. IMPRESSION: 1. No acute intrathoracic process. Electronically Signed   By: Ozell Daring M.D.   On: 04/16/2023 23:12    Microbiology: Recent Results (from the past 240 hours)  Resp panel by RT-PCR (RSV, Flu A&B, Covid) Anterior Nasal Swab     Status: None   Collection Time: 04/16/23 11:30 PM   Specimen: Anterior Nasal Swab  Result Value Ref Range Status   SARS Coronavirus 2 by RT PCR NEGATIVE NEGATIVE Final    Comment: (NOTE) SARS-CoV-2 target nucleic acids are NOT DETECTED.  The SARS-CoV-2 RNA is generally detectable in upper respiratory specimens during the acute phase of infection. The lowest concentration of SARS-CoV-2 viral copies this assay can detect is 138 copies/mL. A negative result does not preclude SARS-Cov-2 infection and should not be used as  the sole basis for treatment or other patient management decisions. A negative result may occur with  improper specimen collection/handling, submission of specimen other than nasopharyngeal swab, presence of viral mutation(s) within the areas targeted by this assay, and inadequate number of viral copies(<138 copies/mL). A negative result must be combined with clinical observations, patient history, and epidemiological information. The expected result is Negative.  Fact Sheet for Patients:  bloggercourse.com  Fact Sheet for Healthcare Providers:  seriousbroker.it  This test is no t yet approved or cleared by the [place]  FDA and  has been authorized for detection and/or diagnosis of SARS-CoV-2 by FDA under an Emergency Use Authorization (EUA). This EUA will remain  in effect (meaning this test can be used) for the duration of the COVID-19 declaration under Section  564(b)(1) of the Act, 21 U.S.C.section 360bbb-3(b)(1), unless the authorization is terminated  or revoked sooner.       Influenza A by PCR NEGATIVE NEGATIVE Final   Influenza B by PCR NEGATIVE NEGATIVE Final    Comment: (NOTE) The Xpert Xpress SARS-CoV-2/FLU/RSV plus assay is intended as an aid in the diagnosis of influenza from Nasopharyngeal swab specimens and should not be used as a sole basis for treatment. Nasal washings and aspirates are unacceptable for Xpert Xpress SARS-CoV-2/FLU/RSV testing.  Fact Sheet for Patients: bloggercourse.com  Fact Sheet for Healthcare Providers: seriousbroker.it  This test is not yet approved or cleared by the [place]  FDA and has been authorized for detection and/or diagnosis of SARS-CoV-2 by FDA under an Emergency Use Authorization (EUA). This EUA will remain in effect (meaning this test can be used) for the duration of the COVID-19 declaration under Section 564(b)(1) of the Act, 21 U.S.C. section 360bbb-3(b)(1), unless the authorization is terminated or revoked.     Resp Syncytial Virus by PCR NEGATIVE NEGATIVE Final    Comment: (NOTE) Fact Sheet for Patients: bloggercourse.com  Fact Sheet for Healthcare Providers: seriousbroker.it  This test is not yet approved or cleared by the [place]  FDA and has been authorized for detection and/or diagnosis of SARS-CoV-2 by FDA under an Emergency Use Authorization (EUA). This EUA will remain in effect (meaning this test can be used) for the duration of the COVID-19 declaration under Section 564(b)(1) of the Act, 21 U.S.C. section 360bbb-3(b)(1), unless the authorization is terminated or revoked.  Performed at [facility], [address] [place], [place] [address]   C Difficile Quick Screen w PCR reflex     Status: None   Collection Time: 04/17/23  7:24 AM   Specimen:  Urine, Clean Catch; Stool  Result Value Ref Range Status   C Diff antigen NEGATIVE NEGATIVE Final   C Diff toxin NEGATIVE NEGATIVE Final   C Diff interpretation No C. difficile detected.  Final    Comment: Performed at [facility], [address] [place], [place] [address]  Gastrointestinal Panel by PCR , Stool     Status: None   Collection Time: 04/17/23  7:24 AM   Specimen: Urine, Clean Catch; Stool  Result Value Ref Range Status   Campylobacter species NOT DETECTED NOT DETECTED Final   Plesimonas shigelloides NOT DETECTED NOT DETECTED Final   Salmonella species NOT DETECTED NOT DETECTED Final   Yersinia enterocolitica NOT DETECTED NOT DETECTED Final   Vibrio species NOT DETECTED NOT DETECTED Final   Vibrio cholerae NOT DETECTED NOT DETECTED Final   Enteroaggregative E coli (EAEC) NOT DETECTED NOT DETECTED Final   Enteropathogenic E coli (EPEC) NOT DETECTED NOT  DETECTED Final   Enterotoxigenic E coli (ETEC) NOT DETECTED NOT DETECTED Final   Shiga like toxin producing E coli (STEC) NOT DETECTED NOT DETECTED Final   Shigella/Enteroinvasive E coli (EIEC) NOT DETECTED NOT DETECTED Final   Cryptosporidium NOT DETECTED NOT DETECTED Final   Cyclospora cayetanensis NOT DETECTED NOT DETECTED Final   Entamoeba histolytica NOT DETECTED NOT DETECTED Final   Giardia lamblia NOT DETECTED NOT DETECTED Final   Adenovirus F40/41 NOT DETECTED NOT DETECTED Final   Astrovirus NOT DETECTED NOT DETECTED Final   Norovirus GI/GII NOT DETECTED NOT DETECTED Final   Rotavirus A NOT DETECTED NOT DETECTED Final   Sapovirus (I, II, IV, and V) NOT DETECTED NOT DETECTED Final    Comment: Performed at [facility], [address] [place], [place] [address]     Labs: CBC: Recent Labs  Lab 04/16/23 2215 04/17/23 0324 04/18/23 0502  WBC 21.2* 16.2* 6.7  HGB 17.6* 15.3 15.2  HCT 50.5 43.9 44.7  MCV 89.2 90.9 91.2  PLT 399 347 355   Basic Metabolic Panel: Recent Labs  Lab  04/16/23 2215 04/17/23 0324 04/18/23 0502  NA 135 134* 137  K 4.8 4.4 4.2  CL 102 106 109  CO2 23 20* 20*  GLUCOSE 147* 123* 96  BUN 28* 28* 16  CREATININE 1.70* 1.29* 0.94  CALCIUM 9.0 7.9* 8.5*  MG 2.2  --  2.1  PHOS  --  2.2* 2.0*   Liver Function Tests: Recent Labs  Lab 04/16/23 2215  AST 26  ALT 29  ALKPHOS 66  BILITOT 0.9  PROT 7.4  ALBUMIN 4.3   Recent Labs  Lab 04/16/23 2215  LIPASE 29   No results for input(s): AMMONIA in the last 168 hours. Cardiac Enzymes: No results for input(s): CKTOTAL, CKMB, CKMBINDEX, TROPONINI in the last 168 hours. BNP (last 3 results) No results for input(s): BNP in the last 8760 hours. CBG: No results for input(s): GLUCAP in the last 168 hours.  Time spent: 35 minutes  Signed:  Elvan Sor  [facility] 04/18/2023 9:57 AM

## 2023-04-18 NOTE — Plan of Care (Signed)

## 2023-04-24 ENCOUNTER — Encounter: Payer: Self-pay | Admitting: Family Medicine

## 2023-04-24 ENCOUNTER — Ambulatory Visit: Payer: 59 | Admitting: Family Medicine

## 2023-04-24 VITALS — BP 120/80 | HR 71 | Ht 68.0 in | Wt 230.9 lb

## 2023-04-24 DIAGNOSIS — R55 Syncope and collapse: Secondary | ICD-10-CM

## 2023-04-24 DIAGNOSIS — Z23 Encounter for immunization: Secondary | ICD-10-CM

## 2023-04-24 DIAGNOSIS — R251 Tremor, unspecified: Secondary | ICD-10-CM

## 2023-04-24 DIAGNOSIS — I10 Essential (primary) hypertension: Secondary | ICD-10-CM

## 2023-04-24 DIAGNOSIS — E7800 Pure hypercholesterolemia, unspecified: Secondary | ICD-10-CM

## 2023-04-24 DIAGNOSIS — N179 Acute kidney failure, unspecified: Secondary | ICD-10-CM

## 2023-04-24 MED ORDER — LISINOPRIL 40 MG PO TABS: 40.0000 mg | ORAL_TABLET | Freq: Every day | ORAL | 1 refills | Status: DC

## 2023-04-24 MED ORDER — PROPRANOLOL HCL ER 60 MG PO CP24: 60.0000 mg | ORAL_CAPSULE | Freq: Every day | ORAL | 1 refills | Status: DC

## 2023-04-24 MED ORDER — SIMVASTATIN 20 MG PO TABS: 20.0000 mg | ORAL_TABLET | Freq: Every day | ORAL | 1 refills | Status: DC

## 2023-04-24 NOTE — Assessment & Plan Note (Signed)
 Chronic hyperlipidemia managed with simvastatin 20 mg daily. Last cholesterol check was 1.5 years ago. Emphasized regular lipid monitoring to manage cardiovascular risk. - Refill simvastatin 20 mg daily - Order cholesterol check

## 2023-04-24 NOTE — Assessment & Plan Note (Signed)
 Chronic hypertension managed with lisinopril  40 mg daily. Blood pressure normalized to 120/80 mmHg upon recheck. Recent hypotensive episode due to dehydration and gastroenteritis. Emphasized maintaining hydration to prevent hypotension. - Refill lisinopril  40 mg daily - Monitor blood pressure regularly

## 2023-04-24 NOTE — Progress Notes (Signed)
 Established patient visit   Patient: Joel Terry   DOB: December 23, 1965   58 y.o. Male  MRN: [id number] Visit Date: 04/24/2023  Today's healthcare provider: Jon Eva, MD   Chief Complaint  Patient presents with   Medical Management of Chronic Issues   Hypertension   Tremors    Hand tremors becoming worse to the point patient is unable to write legibly    Hospitalization Follow-up    Patient reports    Subjective    Hypertension   HPI     Hypertension   Anxiety: Absent.  Blurred vision: Absent.  Chest pain: Absent.  Chest pressure/discomfort: Absent.  Dyspnea: Absent.  Headaches: Absent.  Lower extremity edema: Absent.  Orthopnea: Absent.  Palpitations: Absent.  Paroxysmal nocturnal dyspnea: Absent.  Syncope: Present.  The patient never exercises.        Tremors    Additional comments: Hand tremors becoming worse to the point patient is unable to write legibly         Hospitalization Follow-up    Additional comments: Patient reports       Last edited by Lilian Fitzpatrick, CMA on 04/24/2023  8:14 AM.       Discussed the use of AI scribe software for clinical note transcription with the patient, who gave verbal consent to proceed.  History of Present Illness   The patient, with a history of hypertension and tremors, presents for a follow-up visit after a recent hospitalization due to dehydration from gastroenteritis. The patient reports that their blood pressure is typically high during clinic visits but assures that they have been taking their Lisinopril  40mg  daily without fail. The patient also reports worsening tremors, which have now started to affect their ability to write, although they can still perform tasks such as drinking from a can and typing. The patient's father also had a history of tremors. The patient was recently hospitalized due to severe dehydration from gastroenteritis. They report that they had not felt well for a couple of weeks prior  to the hospitalization but did not think they were losing enough fluid to become that dehydrated. The patient also reports that they have been experiencing these episodes of dehydration a couple of times a year for the past two to three years. The patient is currently taking Simvastatin  20mg  daily for cholesterol management and is due for a cholesterol check. The patient has no known allergies to medications.        Medications: Outpatient Medications Prior to Visit  Medication Sig   albuterol  (PROVENTIL ) (2.5 MG/3ML) 0.083% nebulizer solution Take 3 mLs (2.5 mg total) by nebulization every 6 (six) hours as needed for wheezing or shortness of breath.   albuterol  (VENTOLIN  HFA) 108 (90 Base) MCG/ACT inhaler INHALE 2 PUFFS INTO THE LUNGS EVERY 6 HOURS AS NEEDED FOR WHEEZING OR SHORTNESS OF BREATH   EPINEPHrine  0.3 mg/0.3 mL IJ SOAJ injection Inject 0.3 mLs (0.3 mg total) into the muscle as needed for anaphylaxis.   Nebulizer MISC Use with prescribed solution q 6 hours as needed   Respiratory Therapy Supplies (NEBULIZER MASK ADULT) MISC Use with prescribed solution q 6 hours as needed   [DISCONTINUED] lisinopril  (ZESTRIL ) 40 MG tablet TAKE 1 TABLET(40 MG) BY MOUTH DAILY   [DISCONTINUED] simvastatin  (ZOCOR ) 20 MG tablet TAKE 1 TABLET(20 MG) BY MOUTH DAILY AT 6 PM   No facility-administered medications prior to visit.    Review of Systems      Objective  BP 120/80 (BP Location: Left Arm, Patient Position: Sitting, Cuff Size: Large)   Pulse 71   Ht 5' 8 (1.727 m)   Wt 230 lb 14.4 oz (104.7 kg)   SpO2 99%   BMI 35.11 kg/m    Physical Exam Vitals reviewed.  Constitutional:      General: He is not in acute distress.    Appearance: Normal appearance. He is not diaphoretic.  HENT:     Head: Normocephalic and atraumatic.  Eyes:     General: No scleral icterus.    Conjunctiva/sclera: Conjunctivae normal.  Cardiovascular:     Rate and Rhythm: Normal rate and regular rhythm.      Heart sounds: Normal heart sounds. No murmur heard. Pulmonary:     Effort: Pulmonary effort is normal. No respiratory distress.     Breath sounds: Normal breath sounds. No wheezing or rhonchi.  Musculoskeletal:     Cervical back: Neck supple.     Right lower leg: No edema.     Left lower leg: No edema.  Lymphadenopathy:     Cervical: No cervical adenopathy.  Skin:    General: Skin is warm and dry.     Findings: No rash.  Neurological:     Mental Status: He is alert and oriented to person, place, and time. Mental status is at baseline.     Comments: Intention tremor R>L  Psychiatric:        Mood and Affect: Mood normal.        Behavior: Behavior normal.      No results found for any visits on 04/24/23.  Assessment & Plan     Problem List Items Addressed This Visit       Cardiovascular and Mediastinum   Essential hypertension - Primary   Chronic hypertension managed with lisinopril  40 mg daily. Blood pressure normalized to 120/80 mmHg upon recheck. Recent hypotensive episode due to dehydration and gastroenteritis. Emphasized maintaining hydration to prevent hypotension. - Refill lisinopril  40 mg daily - Monitor blood pressure regularly      Relevant Medications   lisinopril  (ZESTRIL ) 40 MG tablet   simvastatin  (ZOCOR ) 20 MG tablet   propranolol  ER (INDERAL  LA) 60 MG 24 hr capsule   Other Relevant Orders   Comprehensive metabolic panel     Genitourinary   AKI (acute kidney injury) (HCC)   Relevant Orders   Phosphorus     Other   HLD (hyperlipidemia)   Chronic hyperlipidemia managed with simvastatin  20 mg daily. Last cholesterol check was 1.5 years ago. Emphasized regular lipid monitoring to manage cardiovascular risk. 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 No prior treatment. Discussed propranolol  as a treatment option, explaining its effects and potential side effects like bradycardia and hypotension. - Refer to neurology at [facility] (per patient preference) - Prescribe propranolol  60 mg long-acting daily      Relevant Orders   Ambulatory referral to Neurology   Recurrent syncope   Recent hospitalization for syncope 2/2 dehydration secondary to gastroenteritis, exacerbated by absence of large intestine. Emphasized routine hydration and monitoring for dehydration signs. Recommended consistent intake of 2 liters of water daily. -  Encourage intake of 2 liters of water daily - Monitor for dehydration signs and adjust fluid intake as needed      Other Visit Diagnoses       Flu vaccine need       Relevant Orders   Flu vaccine trivalent PF, 6mos and older(Flulaval,Afluria,Fluarix,Fluzone)           General Health Maintenance Routine health maintenance including vaccinations and screenings. No adverse reactions to flu shots. - Administer flu shot - Recheck electrolytes - Schedule physical exam in six months  Follow-up - Schedule follow-up appointment in six months for blood pressure and physical exam - Follow up with neurology if delays or issues with the appointment - Adjust propranolol  dosage if needed based on response.        Return in about 6 months (around 10/22/2023) for CPE.       Jon Eva, MD  [facility] [phone] (phone) [phone] (fax)  [facility]

## 2023-04-24 NOTE — Assessment & Plan Note (Signed)
 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 Emphasized routine hydration and monitoring for dehydration signs. Recommended consistent intake of 2 liters of water daily. - Encourage intake of 2 liters of water daily - Monitor for dehydration signs and adjust fluid intake as needed

## 2023-04-24 NOTE — Assessment & Plan Note (Signed)
 Chronic tremor affecting fine motor skills, with family history. No prior treatment. Discussed propranolol  as a treatment option, explaining its effects and potential side effects like bradycardia and hypotension. - Refer to neurology at [facility] (per patient preference) - Prescribe propranolol  60 mg long-acting daily

## 2023-04-25 LAB — COMPREHENSIVE METABOLIC PANEL
ALT: 48 [IU]/L — ABNORMAL HIGH (ref 0–44)
AST: 28 [IU]/L (ref 0–40)
Albumin: 4.4 g/dL (ref 3.8–4.9)
Alkaline Phosphatase: 77 [IU]/L (ref 44–121)
BUN/Creatinine Ratio: 13 (ref 9–20)
BUN: 14 mg/dL (ref 6–24)
Bilirubin Total: 0.3 mg/dL (ref 0.0–1.2)
CO2: 23 mmol/L (ref 20–29)
Calcium: 9.8 mg/dL (ref 8.7–10.2)
Chloride: 102 mmol/L (ref 96–106)
Creatinine, Ser: 1.04 mg/dL (ref 0.76–1.27)
Globulin, Total: 2.4 g/dL (ref 1.5–4.5)
Glucose: 97 mg/dL (ref 70–99)
Potassium: 5.3 mmol/L — ABNORMAL HIGH (ref 3.5–5.2)
Sodium: 140 mmol/L (ref 134–144)
Total Protein: 6.8 g/dL (ref 6.0–8.5)
eGFR: 85 mL/min/{1.73_m2} (ref >59)

## 2023-04-25 LAB — LIPID PANEL
Chol/HDL Ratio: 4.6 {ratio} (ref 0.0–5.0)
Cholesterol, Total: 167 mg/dL (ref 100–199)
HDL: 36 mg/dL — ABNORMAL LOW (ref >39)
LDL Chol Calc (NIH): 112 mg/dL — ABNORMAL HIGH (ref 0–99)
Triglycerides: 105 mg/dL (ref 0–149)
VLDL Cholesterol Cal: 19 mg/dL (ref 5–40)

## 2023-04-25 LAB — PHOSPHORUS: Phosphorus: 2.9 mg/dL (ref 2.8–4.1)

## 2023-05-29 NOTE — Addendum Note (Signed)
Addended by: Lily Kocher on: 05/29/2023 11:03 AM   Modules accepted: Orders

## 2023-05-30 ENCOUNTER — Other Ambulatory Visit: Payer: Self-pay

## 2023-05-30 ENCOUNTER — Ambulatory Visit: Payer: 59 | Admitting: Family Medicine

## 2023-05-30 DIAGNOSIS — E875 Hyperkalemia: Secondary | ICD-10-CM

## 2023-05-30 DIAGNOSIS — R7989 Other specified abnormal findings of blood chemistry: Secondary | ICD-10-CM

## 2023-05-31 LAB — COMPREHENSIVE METABOLIC PANEL
ALT: 26 [IU]/L (ref 0–44)
AST: 21 [IU]/L (ref 0–40)
Albumin: 4.4 g/dL (ref 3.8–4.9)
Alkaline Phosphatase: 85 [IU]/L (ref 44–121)
BUN/Creatinine Ratio: 17 (ref 9–20)
BUN: 19 mg/dL (ref 6–24)
Bilirubin Total: 0.6 mg/dL (ref 0.0–1.2)
CO2: 21 mmol/L (ref 20–29)
Calcium: 9.2 mg/dL (ref 8.7–10.2)
Chloride: 105 mmol/L (ref 96–106)
Creatinine, Ser: 1.15 mg/dL (ref 0.76–1.27)
Globulin, Total: 2.1 g/dL (ref 1.5–4.5)
Glucose: 89 mg/dL (ref 70–99)
Potassium: 4.8 mmol/L (ref 3.5–5.2)
Sodium: 141 mmol/L (ref 134–144)
Total Protein: 6.5 g/dL (ref 6.0–8.5)
eGFR: 75 mL/min/{1.73_m2} (ref >59)

## 2023-06-02 ENCOUNTER — Encounter: Payer: Self-pay | Admitting: Family Medicine

## 2023-06-05 ENCOUNTER — Telehealth: Payer: Self-pay | Admitting: Gastroenterology

## 2023-06-05 DIAGNOSIS — K5190 Ulcerative colitis, unspecified, without complications: Secondary | ICD-10-CM

## 2023-06-05 NOTE — Telephone Encounter (Signed)
Ok to send referral as requested - I guess Dr Tobi Bastos is moving to [facility], so send referral to [facility] And mention Dr Tobi Bastos in notes.

## 2023-06-05 NOTE — Telephone Encounter (Signed)
 Pt requesting call back to schedule colonoscopy.

## 2023-06-05 NOTE — Telephone Encounter (Signed)
 Referral placed.

## 2023-06-05 NOTE — Addendum Note (Signed)
Addended by: Lily Kocher on: 06/05/2023 03:03 PM   Modules accepted: Orders

## 2023-06-05 NOTE — Telephone Encounter (Signed)
Patient has requested to have referral sent to [facility] Provider: Dr. Tobi Bastos Reason: Re-establish care  with Dr. Tobi Bastos  for Ulcerative Colitis.  Informed patient that his request will be sent to his PCP to send referral to [facility].  Thanks for your assistance,  Marcelino Duster, [place]

## 2023-07-07 ENCOUNTER — Other Ambulatory Visit: Payer: Self-pay | Admitting: Family Medicine

## 2023-07-08 NOTE — Telephone Encounter (Signed)
 Requested Prescriptions  Pending Prescriptions Disp Refills   propranolol ER (INDERAL LA) 60 MG 24 hr capsule [Pharmacy Med Name: PROPRANOLOL ER 60MG  CAPSULES] 90 capsule 0    Sig: TAKE 1 CAPSULE(60 MG) BY MOUTH DAILY     Cardiovascular:  Beta Blockers Passed - 07/08/2023 11:05 AM      Passed - Last BP in normal range    BP Readings from Last 1 Encounters:  04/24/23 120/80         Passed - Last Heart Rate in normal range    Pulse Readings from Last 1 Encounters:  04/24/23 71         Passed - Valid encounter within last 6 months    Recent Outpatient Visits           2 months ago Essential hypertension   [facility] [place], Marzella Schlein, MD   6 months ago Acute non-recurrent pansinusitis   [place] [facility] [place], Marzella Schlein, MD   1 year ago Acute bronchitis, unspecified organism   [facility] Alfredia Ferguson, PA-C   1 year ago Acute bronchitis, unspecified organism   [facility] Alfredia Ferguson, PA-C   1 year ago Benign essential HTN   [place] [facility] Alfredia Ferguson, PA-C       Future Appointments             In 3 months [place], Marzella Schlein, MD [facility]

## 2023-10-05 ENCOUNTER — Telehealth: Admitting: Family Medicine

## 2023-10-05 DIAGNOSIS — J069 Acute upper respiratory infection, unspecified: Secondary | ICD-10-CM

## 2023-10-05 MED ORDER — BENZONATATE 200 MG PO CAPS
200.0000 mg | ORAL_CAPSULE | Freq: Two times a day (BID) | ORAL | 0 refills | Status: DC | PRN
Start: 2023-10-05 — End: 2023-10-27

## 2023-10-05 MED ORDER — FLUTICASONE PROPIONATE 50 MCG/ACT NA SUSP: 2.0000 | Freq: Every day | NASAL | 6 refills | Status: DC

## 2023-10-05 NOTE — Progress Notes (Signed)

## 2023-10-07 ENCOUNTER — Other Ambulatory Visit: Payer: Self-pay | Admitting: Family Medicine

## 2023-10-27 ENCOUNTER — Ambulatory Visit: Payer: Self-pay | Admitting: Family Medicine

## 2023-10-27 ENCOUNTER — Encounter: Payer: Self-pay | Admitting: Family Medicine

## 2023-10-27 VITALS — BP 122/82 | HR 70 | Resp 14 | Ht 68.0 in | Wt 247.8 lb

## 2023-10-27 DIAGNOSIS — R251 Tremor, unspecified: Secondary | ICD-10-CM

## 2023-10-27 DIAGNOSIS — Z1159 Encounter for screening for other viral diseases: Secondary | ICD-10-CM

## 2023-10-27 DIAGNOSIS — I10 Essential (primary) hypertension: Secondary | ICD-10-CM

## 2023-10-27 DIAGNOSIS — K5190 Ulcerative colitis, unspecified, without complications: Secondary | ICD-10-CM

## 2023-10-27 DIAGNOSIS — Z0001 Encounter for general adult medical examination with abnormal findings: Secondary | ICD-10-CM

## 2023-10-27 DIAGNOSIS — F3341 Major depressive disorder, recurrent, in partial remission: Secondary | ICD-10-CM | POA: Diagnosis not present

## 2023-10-27 DIAGNOSIS — Z23 Encounter for immunization: Secondary | ICD-10-CM

## 2023-10-27 DIAGNOSIS — E785 Hyperlipidemia, unspecified: Secondary | ICD-10-CM

## 2023-10-27 DIAGNOSIS — R058 Other specified cough: Secondary | ICD-10-CM

## 2023-10-27 DIAGNOSIS — Z0000 Encounter for general adult medical examination without abnormal findings: Secondary | ICD-10-CM

## 2023-10-27 DIAGNOSIS — Z1211 Encounter for screening for malignant neoplasm of colon: Secondary | ICD-10-CM

## 2023-10-27 NOTE — Progress Notes (Unsigned)
 Complete physical exam   Patient: Joel Terry   DOB: 1966/02/27   58 y.o. Male  MRN: [id number] Visit Date: 10/27/2023  Today's healthcare provider: Jon Eva, MD   Chief Complaint  Patient presents with   Annual Exam    Sleeping pattern: Not too well, wakes up 2-3 times at night Exercise: Walking   URI    Had an E visit 6/22, still complains of cough. No antibiotics were given.   Subjective    Joel Terry is a 58 y.o. male who presents today for a complete physical exam.   Discussed the use of AI scribe software for clinical note transcription with the patient, who gave verbal consent to proceed.  History of Present Illness   Joel Terry is a 58 year old male who presents for an annual physical exam.  He has a persistent cough following an upper respiratory infection that began in the third week of June. Initially dry and irritating, the cough is now more productive and occurs every 30 minutes, causing near blackouts. He has used a leftover Breo inhaler with minimal relief. Recently, he has experienced increased nasal congestion and drainage.  Hypertension is managed with lisinopril  40 mg daily. Home blood pressure readings are typically in the 120s/80s, although he experiences 'white coat syndrome' during office visits. He is on a statin 20 mg daily for hyperlipidemia.  His depression is in remission, and he is not taking any medications for it. Ulcerative colitis is well-controlled with no recent bleeding or other symptoms. He was previously prescribed propranolol  60 mg daily for tremors, which improved his handwriting but caused significant lethargy and weight gain, leading to discontinuation. He has gained approximately 30 pounds since January.  He has completed his shingles and pneumonia vaccinations and recently received the first dose of the hepatitis B vaccine. He is due for a colonoscopy, which was last performed in 2019, and he is awaiting a  new appointment with a gastroenterologist.        Last depression screening scores    10/27/2023    8:31 AM 04/25/2022    1:40 PM 08/28/2021    2:56 PM  PHQ 2/9 Scores  PHQ - 2 Score 0 0 0  PHQ- 9 Score 8 3 2    Last fall risk screening    10/27/2023    8:31 AM  Fall Risk   Falls in the past year? 1  Number falls in past yr: 0  Injury with Fall? 0  Risk for fall due to : No Fall Risks        Medications: Outpatient Medications Prior to Visit  Medication Sig   albuterol  (PROVENTIL ) (2.5 MG/3ML) 0.083% nebulizer solution Take 3 mLs (2.5 mg total) by nebulization every 6 (six) hours as needed for wheezing or shortness of breath.   albuterol  (VENTOLIN  HFA) 108 (90 Base) MCG/ACT inhaler INHALE 2 PUFFS INTO THE LUNGS EVERY 6 HOURS AS NEEDED FOR WHEEZING OR SHORTNESS OF BREATH   EPINEPHrine  0.3 mg/0.3 mL IJ SOAJ injection Inject 0.3 mLs (0.3 mg total) into the muscle as needed for anaphylaxis.   lisinopril  (ZESTRIL ) 40 MG tablet Take 1 tablet (40 mg total) by mouth daily. TAKE 1 TABLET(40 MG) BY MOUTH DAILY   Nebulizer MISC Use with prescribed solution q 6 hours as needed   Respiratory Therapy Supplies (NEBULIZER MASK ADULT) MISC Use with prescribed solution q 6 hours as needed   simvastatin  (ZOCOR ) 20 MG tablet Take 1 tablet (  20 mg total) by mouth daily at 6 PM. TAKE 1 TABLET(20 MG) BY MOUTH DAILY AT 6 PM   [DISCONTINUED] benzonatate  (TESSALON ) 200 MG capsule Take 1 capsule (200 mg total) by mouth 2 (two) times daily as needed for cough.   [DISCONTINUED] fluticasone  (FLONASE ) 50 MCG/ACT nasal spray Place 2 sprays into both nostrils daily.   [DISCONTINUED] propranolol  ER (INDERAL  LA) 60 MG 24 hr capsule TAKE 1 CAPSULE(60 MG) BY MOUTH DAILY (Patient not taking: Reported on 10/27/2023)   No facility-administered medications prior to visit.    Review of Systems    Objective    BP 122/82 Comment: home reading  Pulse 70   Resp 14   Ht 5' 8 (1.727 m)   Wt 247 lb 12.8 oz (112.4  kg)   SpO2 98%   BMI 37.68 kg/m    Physical Exam Vitals reviewed.  Constitutional:      General: He is not in acute distress.    Appearance: Normal appearance. He is well-developed. He is not diaphoretic.  HENT:     Head: Normocephalic and atraumatic.     Right Ear: Tympanic membrane, ear canal and external ear normal.     Left Ear: Tympanic membrane, ear canal and external ear normal.     Nose: Nose normal.     Mouth/Throat:     Mouth: Mucous membranes are moist.     Pharynx: Oropharynx is clear. No oropharyngeal exudate.  Eyes:     General: No scleral icterus.    Conjunctiva/sclera: Conjunctivae normal.     Pupils: Pupils are equal, round, and reactive to light.  Neck:     Thyroid: No thyromegaly.  Cardiovascular:     Rate and Rhythm: Normal rate and regular rhythm.     Heart sounds: Normal heart sounds. No murmur heard. Pulmonary:     Effort: Pulmonary effort is normal. No respiratory distress.     Breath sounds: Normal breath sounds. No wheezing or rales.  Abdominal:     General: There is no distension.     Palpations: Abdomen is soft.     Tenderness: There is no abdominal tenderness.  Musculoskeletal:        General: No deformity.     Cervical back: Neck supple.     Right lower leg: No edema.     Left lower leg: No edema.  Lymphadenopathy:     Cervical: No cervical adenopathy.  Skin:    General: Skin is warm and dry.     Findings: No rash.  Neurological:     Mental Status: He is alert and oriented to person, place, and time. Mental status is at baseline.     Gait: Gait normal.  Psychiatric:        Mood and Affect: Mood normal.        Behavior: Behavior normal.        Thought Content: Thought content normal.      Results for orders placed or performed in visit on 10/27/23  Comprehensive metabolic panel with GFR  Result Value Ref Range   Glucose 94 70 - 99 mg/dL   BUN 20 6 - 24 mg/dL   Creatinine, Ser 8.78 0.76 - 1.27 mg/dL   eGFR 71 >40 fO/fpw/8.26    BUN/Creatinine Ratio 17 9 - 20   Sodium 140 134 - 144 mmol/L   Potassium 4.8 3.5 - 5.2 mmol/L   Chloride 105 96 - 106 mmol/L   CO2 19 (L) 20 - 29 mmol/L   Calcium  9.4 8.7 - 10.2 mg/dL   Total Protein 6.6 6.0 - 8.5 g/dL   Albumin 4.1 3.8 - 4.9 g/dL   Globulin, Total 2.5 1.5 - 4.5 g/dL   Bilirubin Total 0.4 0.0 - 1.2 mg/dL   Alkaline Phosphatase 83 44 - 121 IU/L   AST 24 0 - 40 IU/L   ALT 24 0 - 44 IU/L  Lipid panel  Result Value Ref Range   Cholesterol, Total 161 100 - 199 mg/dL   Triglycerides 898 0 - 149 mg/dL   HDL 35 (L) >60 mg/dL   VLDL Cholesterol Cal 19 5 - 40 mg/dL   LDL Chol Calc (NIH) 892 (H) 0 - 99 mg/dL   Chol/HDL Ratio 4.6 0.0 - 5.0 ratio  Hepatitis C Antibody  Result Value Ref Range   Hep C Virus Ab Non Reactive Non Reactive    Assessment & Plan    Routine Health Maintenance and Physical Exam  Exercise Activities and Dietary recommendations  Goals      Exercise 150 minutes per week (moderate activity)        Immunization History  Administered Date(s) Administered   Hepb-cpg 10/27/2023   Influenza, Seasonal, Injecte, Preservative Fre 04/24/2023   Influenza,inj,Quad PF,6+ Mos 01/13/2015, 02/18/2020, 02/26/2021, 12/18/2021   Influenza-Unspecified 01/31/2019   PFIZER Comirnaty(Gray Top)Covid-19 Tri-Sucrose Vaccine 07/29/2019, 08/19/2019   PNEUMOCOCCAL CONJUGATE-20 04/24/2023   Tdap 09/09/2019   Zoster Recombinant(Shingrix ) 02/26/2021, 08/28/2021    Health Maintenance  Topic Date Due   Colonoscopy  10/23/2020   COVID-19 Vaccine (3 - 2024-25 season) 12/15/2022   INFLUENZA VACCINE  11/14/2023   Hepatitis B Vaccines (2 of 2 - CpG 2-dose series) 11/24/2023   DTaP/Tdap/Td (2 - Td or Tdap) 09/08/2029   Pneumococcal Vaccine 83-64 Years old  Completed   Hepatitis C Screening  Completed   HIV Screening  Completed   Zoster Vaccines- Shingrix   Completed   HPV VACCINES  Aged Out   Meningococcal B Vaccine  Aged Out    Discussed health benefits of physical  activity, and encouraged him to engage in regular exercise appropriate for his age and condition.  Problem List Items Addressed This Visit       Cardiovascular and Mediastinum   Essential hypertension   Blood pressure readings at home are typically in the 120s/80s. Office reading elevated, likely due to white coat syndrome. No symptoms such as headaches indicating significant elevation. - Monitor blood pressure at home - Recheck blood pressure at the end of the visit - Plan for six-month follow-up for blood pressure monitoring - Encourage bringing home blood pressure cuff to next visit for comparison      Relevant Orders   Comprehensive metabolic panel with GFR (Completed)     Digestive   Ulcerative colitis (HCC)   Ulcerative colitis is in remission. No recent bleeding or bothersome polyps reported. Last colonoscopy in 2019, follow-up was due in 2022 but not completed due to scheduling issues. - Send referral to Dr. Therisa at [facility] for colonoscopy and follow-up - Ensure follow-up for colon cancer screening      Relevant Orders   Ambulatory referral to Gastroenterology     Other   HLD (hyperlipidemia)   Currently managed with statin therapy.      Relevant Orders   Comprehensive metabolic panel with GFR (Completed)   Lipid panel (Completed)   Recurrent major depressive disorder, in partial remission (HCC)   Depression is in remission. No current medications for depression.      Tremor  Propranolol  was initially effective for tremor but caused significant lethargy and weight gain, leading to discontinuation. He prefers to manage without medication until neurology follow-up. Other treatment options are available. - Reschedule neurology appointment for further evaluation and management of tremor - Discuss alternative treatment options for tremor with neurology      Other Visit Diagnoses       Encounter for annual physical exam    -  Primary   Relevant Orders    Comprehensive metabolic panel with GFR (Completed)   Lipid panel (Completed)   Hepatitis C Antibody (Completed)     Colon cancer screening       Relevant Orders   Ambulatory referral to Gastroenterology     Need for hepatitis C screening test       Relevant Orders   Hepatitis C Antibody (Completed)     Post-viral cough syndrome              Post-viral cough syndrome with postnasal drip Persistent cough following an upper respiratory infection in June. Symptoms suggest post-viral cough syndrome with postnasal drip. Lungs sound clear, indicating no acute infection. Cough likely exacerbated by postnasal drip. - Use Flonase , two sprays in each nostril, to manage postnasal drip - Use honey cough drops to alleviate cough symptoms  General Health Maintenance Vaccinations and screenings reviewed. Hepatitis B vaccination series initiated. Hepatitis C screening not previously done. Labs due for routine monitoring. - Administer second dose of Hepatitis B vaccine in one month - Order Hepatitis C screening with labs - Order routine labs including cholesterol, kidney and liver function, and blood sugar        Return in about 6 months (around 04/28/2024) for chronic disease f/u and 32m Hep B shot with CMA.     Jon Eva, MD  [facility] [phone] (phone) [phone] (fax)  [facility]

## 2023-10-28 LAB — COMPREHENSIVE METABOLIC PANEL WITH GFR
ALT: 24 IU/L (ref 0–44)
AST: 24 IU/L (ref 0–40)
Albumin: 4.1 g/dL (ref 3.8–4.9)
Alkaline Phosphatase: 83 IU/L (ref 44–121)
BUN/Creatinine Ratio: 17 (ref 9–20)
BUN: 20 mg/dL (ref 6–24)
Bilirubin Total: 0.4 mg/dL (ref 0.0–1.2)
CO2: 19 mmol/L — ABNORMAL LOW (ref 20–29)
Calcium: 9.4 mg/dL (ref 8.7–10.2)
Chloride: 105 mmol/L (ref 96–106)
Creatinine, Ser: 1.21 mg/dL (ref 0.76–1.27)
Globulin, Total: 2.5 g/dL (ref 1.5–4.5)
Glucose: 94 mg/dL (ref 70–99)
Potassium: 4.8 mmol/L (ref 3.5–5.2)
Sodium: 140 mmol/L (ref 134–144)
Total Protein: 6.6 g/dL (ref 6.0–8.5)
eGFR: 71 mL/min/1.73 (ref >59)

## 2023-10-28 LAB — HEPATITIS C ANTIBODY: Hep C Virus Ab: NONREACTIVE

## 2023-10-28 LAB — LIPID PANEL
Chol/HDL Ratio: 4.6 ratio (ref 0.0–5.0)
Cholesterol, Total: 161 mg/dL (ref 100–199)
HDL: 35 mg/dL — ABNORMAL LOW (ref >39)
LDL Chol Calc (NIH): 107 mg/dL — ABNORMAL HIGH (ref 0–99)
Triglycerides: 101 mg/dL (ref 0–149)
VLDL Cholesterol Cal: 19 mg/dL (ref 5–40)

## 2023-10-28 NOTE — Assessment & Plan Note (Signed)
 Blood pressure readings at home are typically in the 120s/80s. Office reading elevated, likely due to white coat syndrome. No symptoms such as headaches indicating significant elevation. - Monitor blood pressure at home - Recheck blood pressure at the end of the visit - Plan for six-month follow-up for blood pressure monitoring - Encourage bringing home blood pressure cuff to next visit for comparison

## 2023-10-28 NOTE — Assessment & Plan Note (Signed)
 Depression is in remission. No current medications for depression.

## 2023-10-28 NOTE — Assessment & Plan Note (Signed)
 Currently managed with statin therapy.

## 2023-10-28 NOTE — Assessment & Plan Note (Signed)
 Ulcerative colitis is in remission. No recent bleeding or bothersome polyps reported. Last colonoscopy in 2019, follow-up was due in 2022 but not completed due to scheduling issues. - Send referral to Dr. Therisa at [facility] for colonoscopy and follow-up - Ensure follow-up for colon cancer screening

## 2023-10-28 NOTE — Assessment & Plan Note (Addendum)
 Propranolol  was initially effective for tremor but caused significant lethargy and weight gain, leading to discontinuation. He prefers to manage without medication until neurology follow-up. Other treatment options are available. - Reschedule neurology appointment for further evaluation and management of tremor - Discuss alternative treatment options for tremor with neurology

## 2023-10-29 ENCOUNTER — Ambulatory Visit: Payer: Self-pay | Admitting: Family Medicine

## 2023-11-20 ENCOUNTER — Other Ambulatory Visit: Payer: Self-pay | Admitting: Family Medicine

## 2023-11-20 DIAGNOSIS — I10 Essential (primary) hypertension: Secondary | ICD-10-CM

## 2023-11-20 DIAGNOSIS — E7800 Pure hypercholesterolemia, unspecified: Secondary | ICD-10-CM

## 2023-12-02 ENCOUNTER — Ambulatory Visit: Admitting: Family Medicine

## 2023-12-23 ENCOUNTER — Ambulatory Visit (INDEPENDENT_AMBULATORY_CARE_PROVIDER_SITE_OTHER): Admitting: Family Medicine

## 2023-12-23 ENCOUNTER — Encounter: Payer: Self-pay | Admitting: Family Medicine

## 2023-12-23 DIAGNOSIS — Z23 Encounter for immunization: Secondary | ICD-10-CM | POA: Diagnosis not present

## 2023-12-23 NOTE — Progress Notes (Signed)
 Patient here for Hep B and flu vaccination only.  I did not examine the patient.  I did review his medical history, medications, and allergies and vaccine consent form.  CMA gave vaccinations. Patient tolerated well.  Myrla Jon HERO, MD, MPH [facility] 12/23/2023 4:11 PM

## 2024-01-02 ENCOUNTER — Encounter: Payer: Self-pay | Admitting: Family Medicine

## 2024-01-02 DIAGNOSIS — Z0000 Encounter for general adult medical examination without abnormal findings: Secondary | ICD-10-CM

## 2024-02-09 ENCOUNTER — Ambulatory Visit: Admitting: Urology

## 2024-02-09 ENCOUNTER — Encounter: Payer: Self-pay | Admitting: Urology

## 2024-02-09 VITALS — BP 145/93 | HR 76 | Ht 68.0 in | Wt 245.0 lb

## 2024-02-09 DIAGNOSIS — N5201 Erectile dysfunction due to arterial insufficiency: Secondary | ICD-10-CM

## 2024-02-09 DIAGNOSIS — N400 Enlarged prostate without lower urinary tract symptoms: Secondary | ICD-10-CM

## 2024-02-09 DIAGNOSIS — Z125 Encounter for screening for malignant neoplasm of prostate: Secondary | ICD-10-CM | POA: Diagnosis not present

## 2024-02-09 LAB — URINALYSIS, COMPLETE
Bilirubin, UA: NEGATIVE
Glucose, UA: NEGATIVE
Ketones, UA: NEGATIVE
Leukocytes,UA: NEGATIVE
Nitrite, UA: NEGATIVE
Protein,UA: NEGATIVE
RBC, UA: NEGATIVE
Specific Gravity, UA: 1.03 (ref 1.005–1.030)
Urobilinogen, Ur: 0.2 mg/dL (ref 0.2–1.0)
pH, UA: 6 (ref 5.0–7.5)

## 2024-02-09 LAB — MICROSCOPIC EXAMINATION

## 2024-02-09 LAB — BLADDER SCAN AMB NON-IMAGING: Scan Result: 0

## 2024-02-09 MED ORDER — TADALAFIL 20 MG PO TABS: ORAL_TABLET | ORAL | 0 refills | Status: AC

## 2024-02-09 NOTE — Progress Notes (Signed)
 02/09/2024 8:00 AM   Joel Terry 09/06/1965 [id number]  Referring provider: Myrla Jon HERO, MD [address] [place],  [place] [address]  Chief Complaint  Patient presents with   Benign Prostatic Hypertrophy    HPI: Joel Terry is a 58 y.o. male with a prior history of nephrolithiasis, BPH and erectile dysfunction previously followed by Dr. Ike.  He presents to establish local urologic care.  Last urology visit was at [facility] in 2021 by Dr. Mitch.  Presently has no bothersome lower urinary tract symptoms.  Last PSA 2021 He complains of difficulty achieving and maintaining an erection.  He was using intracavernosal injections however developed dorsal curvature and discontinued the med He was given a trial of generic sildenafil however had significant headache.  He does not think he has tried tadalafil in the past. No flank, abdominal or pelvic pain Denies gross hematuria   PMH: Past Medical History:  Diagnosis Date   Asthma    Chronic kidney disease    Headache    History of kidney stones    Hypertension    Nephrolithiasis    Obesity    Single subsegmental pulmonary embolism without acute cor pulmonale (HCC) 08/24/2020   Ulcerative colitis (HCC)     Surgical History: Past Surgical History:  Procedure Laterality Date   COLECTOMY     COLONOSCOPY WITH PROPOFOL  N/A 09/16/2014   Procedure: COLONOSCOPY WITH PROPOFOL ;  Surgeon: Gladis RAYMOND Mariner, MD;  Location: [facility] ENDOSCOPY;  Service: Endoscopy;  Laterality: N/A;   FLEXIBLE SIGMOIDOSCOPY N/A 07/11/2017   Procedure: FLEXIBLE SIGMOIDOSCOPY;  Surgeon: Mariner Gladis RAYMOND, MD;  Location: [facility] ENDOSCOPY;  Service: Endoscopy;  Laterality: N/A;   FLEXIBLE SIGMOIDOSCOPY N/A 10/23/2017   Procedure: FLEXIBLE SIGMOIDOSCOPY;  Surgeon: Mariner Gladis RAYMOND, MD;  Location: [facility] ENDOSCOPY;  Service: Endoscopy;  Laterality: N/A;   FLEXIBLE SIGMOIDOSCOPY N/A 08/06/2019   Procedure: FLEXIBLE SIGMOIDOSCOPY;  Surgeon: Therisa Bi, MD;  Location: [facility] ENDOSCOPY;  Service: Gastroenterology;  Laterality: N/A;   HERNIA REPAIR      Home Medications:  Allergies as of 02/09/2024       Reactions   Sulfa Antibiotics Hives   Increased HR and BP   Elemental Sulfur Rash, Other (See Comments)   Elevated BP and heart rate        Medication List        Accurate as of February 09, 2024  8:00 AM. If you have any questions, ask your nurse or doctor.          albuterol  (2.5 MG/3ML) 0.083% nebulizer solution Commonly known as: PROVENTIL  Take 3 mLs (2.5 mg total) by nebulization every 6 (six) hours as needed for wheezing or shortness of breath.   albuterol  108 (90 Base) MCG/ACT inhaler Commonly known as: VENTOLIN  HFA INHALE 2 PUFFS INTO THE LUNGS EVERY 6 HOURS AS NEEDED FOR WHEEZING OR SHORTNESS OF BREATH   EPINEPHrine  0.3 mg/0.3 mL Soaj injection Commonly known as: EPI-PEN Inject 0.3 mLs (0.3 mg total) into the muscle as needed for anaphylaxis.   lisinopril  40 MG tablet Commonly known as: ZESTRIL  TAKE 1 TABLET(40 MG) BY MOUTH DAILY   Nebulizer Mask Adult Misc Use with prescribed solution q 6 hours as needed   Nebulizer Misc Use with prescribed solution q 6 hours as needed   simvastatin  20 MG tablet Commonly known as: ZOCOR  TAKE 1 TABLET(20 MG) BY MOUTH DAILY AT 6 PM        Allergies:  Allergies  Allergen Reactions  Sulfa Antibiotics Hives    Increased HR and BP   Elemental Sulfur Rash and Other (See Comments)    Elevated BP and heart rate    Family History: No family history on file.  Social History:  reports that he has never smoked. He has never used smokeless tobacco. He reports current alcohol use of about 3.0 standard drinks of alcohol per week. He reports that he does not use drugs.   Physical Exam: BP (!) 145/93   Pulse 76   Ht 5' 8 (1.727 m)   Wt 245 lb (111.1 kg)   BMI 37.25 kg/m   Constitutional:  Alert, No acute distress. HEENT: [place] AT Respiratory: Normal  respiratory effort, no increased work of breathing. GU: Prostate only lower one half palpable secondary to body habitus.  No nodules or induration. Psychiatric: Normal mood and affect.  Laboratory Data:  Urinalysis Dipstick/microscopy negative  Assessment & Plan:    1.  Erectile dysfunction Trial tadalafil 20 mg 1 hour prior to sexual activity; if not effective may titrate to 40 mg  2.  BPH without LUTS PVR 0 mL  3.  Prostate cancer screening PSA drawn today    Glendia JAYSON Barba, MD  [facility] [address], [address] [place], [place] [address] [phone]

## 2024-02-10 LAB — PSA: Prostate Specific Ag, Serum: 0.7 ng/mL (ref 0.0–4.0)

## 2024-03-05 DIAGNOSIS — G250 Essential tremor: Secondary | ICD-10-CM | POA: Diagnosis not present

## 2024-04-02 ENCOUNTER — Encounter: Payer: Self-pay | Admitting: Family Medicine

## 2024-04-02 DIAGNOSIS — R251 Tremor, unspecified: Secondary | ICD-10-CM

## 2024-04-09 MED ORDER — PRIMIDONE 50 MG PO TABS: ORAL_TABLET | ORAL | 0 refills | Status: AC

## 2024-04-16 ENCOUNTER — Encounter: Payer: Self-pay | Admitting: Anesthesiology

## 2024-04-16 ENCOUNTER — Encounter
Admission: RE | Disposition: A | Discharge status: Home / Self Care | Payer: Self-pay | Source: Home / Self Care | Attending: Gastroenterology

## 2024-04-16 ENCOUNTER — Ambulatory Visit
Admission: RE | Admit: 2024-04-16 | Discharge: 2024-04-16 | Disposition: A | Discharge status: Home / Self Care | Attending: Gastroenterology | Admitting: Gastroenterology

## 2024-04-16 ENCOUNTER — Encounter: Payer: Self-pay | Admitting: Gastroenterology

## 2024-04-16 ENCOUNTER — Other Ambulatory Visit: Payer: Self-pay

## 2024-04-16 DIAGNOSIS — K529 Noninfective gastroenteritis and colitis, unspecified: Secondary | ICD-10-CM | POA: Diagnosis not present

## 2024-04-16 DIAGNOSIS — Z980 Intestinal bypass and anastomosis status: Secondary | ICD-10-CM | POA: Diagnosis not present

## 2024-04-16 DIAGNOSIS — Z1211 Encounter for screening for malignant neoplasm of colon: Secondary | ICD-10-CM | POA: Diagnosis present

## 2024-04-16 DIAGNOSIS — K5100 Ulcerative (chronic) pancolitis without complications: Secondary | ICD-10-CM | POA: Insufficient documentation

## 2024-04-16 HISTORY — PX: RECTAL BIOPSY: SHX2303

## 2024-04-16 HISTORY — PX: FLEXIBLE SIGMOIDOSCOPY: SHX5431

## 2024-04-16 SURGERY — SIGMOIDOSCOPY, FLEXIBLE

## 2024-04-16 MED ORDER — SODIUM CHLORIDE 0.9 % IV SOLN: INTRAVENOUS | Status: DC

## 2024-04-16 NOTE — H&P (Signed)
 "                                                                                                                           Ruel Kung , MD [address], [address], [place], [place], [address] Phone: [phone] Fax: [phone]  Primary Care Physician:  Myrla Jon HERO, MD   Pre-Procedure History & Physical: HPI:  Joel Terry is a 59 y.o. male is here for a sigmoidoscopy    Past Medical History:  Diagnosis Date   Asthma    Chronic kidney disease    Headache    History of kidney stones    Hypertension    Nephrolithiasis    Obesity    Single subsegmental pulmonary embolism without acute cor pulmonale (HCC) 08/24/2020   Ulcerative colitis (HCC)     Past Surgical History:  Procedure Laterality Date   COLECTOMY     COLONOSCOPY WITH PROPOFOL  N/A 09/16/2014   Procedure: COLONOSCOPY WITH PROPOFOL ;  Surgeon: Gladis RAYMOND Mariner, MD;  Location: [facility] ENDOSCOPY;  Service: Endoscopy;  Laterality: N/A;   FLEXIBLE SIGMOIDOSCOPY N/A 07/11/2017   Procedure: FLEXIBLE SIGMOIDOSCOPY;  Surgeon: Mariner Gladis RAYMOND, MD;  Location[facility] ENDOSCOPY;  Service: Endoscopy;  Laterality: N/A;   FLEXIBLE SIGMOIDOSCOPY N/A 10/23/2017   Procedure: FLEXIBLE SIGMOIDOSCOPY;  Surgeon: Mariner Gladis RAYMOND, MD;  Location: [facility] ENDOSCOPY;  Service: Endoscopy;  Laterality: N/A;   FLEXIBLE SIGMOIDOSCOPY N/A 08/06/2019   Procedure: FLEXIBLE SIGMOIDOSCOPY;  Surgeon: Kung Ruel, MD;  Location: [facility] ENDOSCOPY;  Service: Gastroenterology;  Laterality: N/A;   HERNIA REPAIR      Prior to Admission medications  Medication Sig Start Date End Date Taking? Authorizing Provider  albuterol  (PROVENTIL ) (2.5 MG/3ML) 0.083% nebulizer solution Take 3 mLs (2.5 mg total) by nebulization every 6 (six) hours as needed for wheezing or shortness of breath. 05/14/22   Cyndi Shaver, PA-C  albuterol  (VENTOLIN  HFA) 108 (90 Base) MCG/ACT inhaler INHALE 2 PUFFS INTO THE LUNGS EVERY 6 HOURS AS NEEDED FOR WHEEZING OR SHORTNESS OF BREATH  06/11/22   Drubel, Shaver, PA-C  EPINEPHrine  0.3 mg/0.3 mL IJ SOAJ injection Inject 0.3 mLs (0.3 mg total) into the muscle as needed for anaphylaxis. 11/25/19   Pollak, Adriana M, PA-C  lisinopril  (ZESTRIL ) 40 MG tablet TAKE 1 TABLET(40 MG) BY MOUTH DAILY 11/20/23   Myrla Jon HERO, MD  Nebulizer MISC Use with prescribed solution q 6 hours as needed 05/14/22   Drubel, Shaver, PA-C  primidone  (MYSOLINE ) 50 MG tablet Take one tablet daily for two weeks, then increase to one tablet twice daily.  Follow-up required for refill. 04/09/24   Donzella Lauraine SAILOR, DO  Respiratory Therapy Supplies (NEBULIZER MASK ADULT) MISC Use with prescribed solution q 6 hours as needed 05/14/22   Cyndi Shaver, PA-C  simvastatin  (ZOCOR ) 20 MG tablet TAKE 1 TABLET(20 MG) BY MOUTH DAILY AT 6 PM 11/20/23   Bacigalupo, Jon HERO, MD  tadalafil  (CIALIS ) 20 MG tablet Take 1 tab 1 hour  prior to intercourse 02/09/24   Twylla Glendia BROCKS, MD    Allergies as of 04/09/2024 - Review Complete 04/09/2024  Allergen Reaction Noted   Sulfa antibiotics Hives 04/25/2015   Elemental sulfur Rash and Other (See Comments) 09/05/2014    No family history on file.  Social History   Socioeconomic History   Marital status: Married    Spouse name: Not on file   Number of children: Not on file   Years of education: Not on file   Highest education level: Bachelor's degree (e.g., BA, AB, BS)  Occupational History   Not on file  Tobacco Use   Smoking status: Never   Smokeless tobacco: Never  Vaping Use   Vaping status: Never Used  Substance and Sexual Activity   Alcohol use: Yes    Alcohol/week: 3.0 standard drinks of alcohol    Types: 3 Cans of beer per week    Comment: Occassional   Drug use: No   Sexual activity: Yes  Other Topics Concern   Not on file  Social History Narrative   Not on file   Social Drivers of Health   Tobacco Use: Low Risk (03/05/2024)   Received from [facility]   Patient History    Smoking Tobacco  Use: Never    Smokeless Tobacco Use: Never    Passive Exposure: Never  Financial Resource Strain: Low Risk  (04/06/2024)   Received from [facility]   Overall Financial Resource Strain (CARDIA)    Difficulty of Paying Living Expenses: Not hard at all  Food Insecurity: No Food Insecurity (04/06/2024)   Received from [facility]   Epic    Within the past 12 months, you worried that your food would run out before you got the money to buy more.: Never true    Within the past 12 months, the food you bought just didn't last and you didn't have money to get more.: Never true  Transportation Needs: Unknown (04/06/2024)   Received from [facility] - Transportation    In the past 12 months, has lack of transportation kept you from medical appointments or from getting medications?: No    Lack of Transportation (Non-Medical): Not on file  Physical Activity: Insufficiently Active (10/23/2023)   Exercise Vital Sign    Days of Exercise per Week: 3 days    Minutes of Exercise per Session: 30 min  Stress: No Stress Concern Present (10/23/2023)   [employer] - Occupational Stress Questionnaire    Feeling of Stress: Not at all  Social Connections: Moderately Integrated (10/23/2023)   Social Connection and Isolation Panel    Frequency of Communication with Friends and Family: More than three times a week    Frequency of Social Gatherings with Friends and Family: Once a week    Attends Religious Services: Patient declined    Active Member of Clubs or Organizations: Yes    Attends Banker Meetings: Patient declined    Marital Status: Married  Catering Manager Violence: Not At Risk (04/17/2023)   Humiliation, Afraid, Rape, and Kick questionnaire    Fear of Current or Ex-Partner: No    Emotionally Abused: No    Physically Abused: No    Sexually Abused: No  Depression (PHQ2-9): Medium Risk (10/27/2023)    Depression (PHQ2-9)    PHQ-2 Score: 8  Alcohol Screen: Low Risk (10/23/2023)   Alcohol Screen    Last Alcohol Screening Score (AUDIT):  2  Housing: Low Risk  (04/06/2024)   Received from [facility]   Epic    In the last 12 months, was there a time when you were not able to pay the mortgage or rent on time?: No    In the past 12 months, how many times have you moved where you were living?: 0    At any time in the past 12 months, were you homeless or living in a shelter (including now)?: No  Utilities: Not At Risk (04/06/2024)   Received from [facility]   Epic    In the past 12 months has the electric, gas, oil, or water company threatened to shut off services in your home?: No  Health Literacy: Adequate Health Literacy (10/27/2023)   B1300 Health Literacy    Frequency of need for help with medical instructions: Never    Review of Systems: See HPI, otherwise negative ROS  Physical Exam: There were no vitals taken for this visit. General:   Alert,  pleasant and cooperative in NAD Head:  Normocephalic and atraumatic. Neck:  Supple; no masses or thyromegaly. Lungs:  Clear throughout to auscultation, normal respiratory effort.    Heart:  +S1, +S2, Regular rate and rhythm, No edema. Abdomen:  Soft, nontender and nondistended. Normal bowel sounds, without guarding, and without rebound.   Neurologic:  Alert and  oriented x4;  grossly normal neurologically.  Impression/Plan: Joel Terry is here for a sigmoidoscopy to be performed for dysplasia surveillance, H/o ulcerative colitis   Risks, benefits, limitations, and alternatives regarding  colonoscopy have been reviewed with the patient.  Questions have been answered.  All parties agreeable.   Ruel Kung, MD  04/16/2024, 9:17 AM  "

## 2024-04-16 NOTE — Op Note (Signed)
 [facility] Gastroenterology Patient Name: Joel Terry Procedure Date: 04/16/2024 10:12 AM MRN: [id number] Account #: [id number] Date of Birth: 1965/10/10 Admit Type: Outpatient Age: 59 Room: [facility] ENDO ROOM 1 Gender: Male Note Status: Finalized Instrument Name: Upper GI Scope [id number] Procedure:             Flexible Sigmoidoscopy Indications:           High risk colon cancer surveillance: Ulcerative                         pancolitis of 8 (or more) years duration Providers:             Ruel Kung MD, MD Referring MD:          Jon HERO. Bacigalupo (Referring MD) Medicines:             None Complications:         No immediate complications. Procedure:             Pre-Anesthesia Assessment:                        - Prior to the procedure, a History and Physical was                         performed, and patient medications, allergies and                         sensitivities were reviewed. The patient's tolerance                         of previous anesthesia was reviewed.                        - The risks and benefits of the procedure and the                         sedation options and risks were discussed with the                         patient. All questions were answered and informed                         consent was obtained.                        - ASA Grade Assessment: II - A patient with mild                         systemic disease.                        After obtaining informed consent, the scope was passed                         under direct vision. The Endoscope was introduced                         through the anus and advanced to the the ileo-rectal  anastomosis. The flexible sigmoidoscopy was                         accomplished with ease. The patient tolerated the                         procedure well. The quality of the bowel preparation                         was good. Findings:      The perianal and digital  rectal examinations were normal.      Localized moderate inflammation characterized by congestion (edema),       erythema and shallow ulcerations was found at the anastomosis. Biopsies       were taken with a cold forceps for histology.      Normal mucosa was found in the rectum. Biopsies were taken with a cold       forceps for histology.      The exam was otherwise without abnormality. Impression:            - Localized moderate inflammation was found at the                         colonic anastomosis secondary to colitis. Biopsied.                        - Normal mucosa in the rectum. Biopsied.                        - The examination was otherwise normal. Recommendation:        - Discharge patient to home (with escort).                        - Advance diet as tolerated.                        - Await pathology results.                        - Repeat flexible sigmoidoscopy for surveillance based                         on pathology results. Procedure Code(s):     --- Professional ---                        [phone], Sigmoidoscopy, flexible; with biopsy, single or                         multiple Diagnosis Code(s):     --- Professional ---                        K51.00, Ulcerative (chronic) pancolitis without                         complications                        K52.9, Noninfective gastroenteritis and colitis,  unspecified CPT copyright 2022 American Medical Association. All rights reserved. The codes documented in this report are preliminary and upon coder review may  be revised to meet current compliance requirements. Ruel Kung, MD Ruel Kung MD, MD 04/16/2024 10:26:49 AM This report has been signed electronically. Number of Addenda: 0 Note Initiated On: 04/16/2024 10:12 AM Estimated Blood Loss:  Estimated blood loss: none.      [facility]

## 2024-04-19 LAB — SURGICAL PATHOLOGY

## 2024-04-21 ENCOUNTER — Ambulatory Visit: Payer: Self-pay | Admitting: Gastroenterology

## 2024-04-26 ENCOUNTER — Ambulatory Visit: Admitting: Family Medicine

## 2024-04-29 ENCOUNTER — Ambulatory Visit: Admitting: Family Medicine

## 2024-04-29 VITALS — BP 122/82 | HR 83 | Resp 16 | Ht 69.0 in | Wt 230.0 lb

## 2024-04-29 DIAGNOSIS — I10 Essential (primary) hypertension: Secondary | ICD-10-CM

## 2024-04-29 DIAGNOSIS — E785 Hyperlipidemia, unspecified: Secondary | ICD-10-CM | POA: Diagnosis not present

## 2024-04-29 DIAGNOSIS — R251 Tremor, unspecified: Secondary | ICD-10-CM | POA: Diagnosis not present

## 2024-04-29 NOTE — Progress Notes (Signed)
 "  Established Patient Office Visit  Subjective   Patient ID: Joel Terry, male    DOB: [birth year]  Age: 59 y.o. MRN: [id number]  Chief Complaint  Patient presents with   Follow-up    6 month f/u / Hep B    Joel Terry presenting today for chronic illness f/u with concern for progressive worsening of essential tremor. Was seen my neurology on 11/21 with plan for medical management by PCP. Intends to start primidone  today. Writing and using eating utensils has become more difficult. Denies gait instability and tremor at rest.      Review of Systems  Neurological:  Positive for tremors. Negative for weakness.      Objective:     BP 122/82   Pulse 83   Resp 16   Ht 5' 9 (1.753 m)   Wt 230 lb (104.3 kg)   SpO2 98%   BMI 33.97 kg/m  BP Readings from Last 3 Encounters:  04/29/24 122/82  04/16/24 (!) 139/92  02/09/24 (!) 145/93   Wt Readings from Last 3 Encounters:  04/29/24 230 lb (104.3 kg)  04/16/24 239 lb 6.4 oz (108.6 kg)  02/09/24 245 lb (111.1 kg)      Physical Exam Cardiovascular:     Rate and Rhythm: Normal rate and regular rhythm.  Pulmonary:     Effort: Pulmonary effort is normal.     Breath sounds: Normal breath sounds.  Neurological:     Mental Status: He is alert.     Comments: Intention tremor of b/l hands      No results found for any visits on 04/29/24.  Last metabolic panel Lab Results  Component Value Date   GLUCOSE 94 10/27/2023   NA 140 10/27/2023   K 4.8 10/27/2023   CL 105 10/27/2023   CO2 19 (L) 10/27/2023   BUN 20 10/27/2023   CREATININE 1.21 10/27/2023   EGFR 71 10/27/2023   CALCIUM 9.4 10/27/2023   PHOS 2.9 04/24/2023   PROT 6.6 10/27/2023   ALBUMIN 4.1 10/27/2023   LABGLOB 2.5 10/27/2023   AGRATIO 1.5 05/14/2022   BILITOT 0.4 10/27/2023   ALKPHOS 83 10/27/2023   AST 24 10/27/2023   ALT 24 10/27/2023   ANIONGAP 8 04/18/2023   Last lipids Lab Results  Component Value Date   CHOL 161 10/27/2023   HDL 35 (L)  10/27/2023   LDLCALC 107 (H) 10/27/2023   TRIG 101 10/27/2023   CHOLHDL 4.6 10/27/2023      The 10-year ASCVD risk score (Arnett DK, et al., 2019) is: 7%    Assessment & Plan:   Problem List Items Addressed This Visit       Cardiovascular and Mediastinum   Essential hypertension   Blood pressure well-controlled with lisinopril  40mg  daily. Reading today was 122/82 down from 139/92. - Continue current antihypertensive regimen - Recheck comprehensive metabolic panel      Relevant Orders   Comprehensive metabolic panel with GFR     Other   HLD (hyperlipidemia)   Currently on simvastatin  20 daily. Last LDL was 107. Will recheck lipid panel today.       Relevant Orders   Comprehensive metabolic panel with GFR   Lipid panel   Tremor - Primary   Progressive worsening of tremor. Was seen by neurologist on 11/21 with plan to pursue medical management.  - Start primidone  50mg  daily and up-titrate as needed - F/u in two months to reassess       Return in about  2 months (around 06/27/2024) for tremor f/u.    Kamryn J Mills, Medical Student   Patient seen along with MS3 student, Johnsie Barefoot. I personally evaluated this patient along with the student, and verified all aspects of the history, physical exam, and medical decision making as documented by the student. I agree with the student's documentation and have made all necessary edits.  Cadarius Nevares, Jon HERO, MD, MPH [facility]   "

## 2024-04-29 NOTE — Assessment & Plan Note (Signed)
 Currently on simvastatin  20 daily. Last LDL was 107. Will recheck lipid panel today.

## 2024-04-29 NOTE — Assessment & Plan Note (Addendum)
 Blood pressure well-controlled with lisinopril  40mg  daily. Reading today was 122/82 down from 139/92. - Continue current antihypertensive regimen - Recheck comprehensive metabolic panel

## 2024-04-29 NOTE — Assessment & Plan Note (Addendum)
 Progressive worsening of tremor. Was seen by neurologist on 11/21 with plan to pursue medical management.  - Start primidone  50mg  daily and up-titrate as needed - F/u in two months to reassess

## 2024-04-30 ENCOUNTER — Encounter: Payer: Self-pay | Admitting: Family Medicine

## 2024-04-30 LAB — LIPID PANEL
Chol/HDL Ratio: 5.7 ratio — ABNORMAL HIGH (ref 0.0–5.0)
Cholesterol, Total: 164 mg/dL (ref 100–199)
HDL: 29 mg/dL — ABNORMAL LOW
LDL Chol Calc (NIH): 105 mg/dL — ABNORMAL HIGH (ref 0–99)
Triglycerides: 169 mg/dL — ABNORMAL HIGH (ref 0–149)
VLDL Cholesterol Cal: 30 mg/dL (ref 5–40)

## 2024-04-30 LAB — COMPREHENSIVE METABOLIC PANEL WITH GFR
ALT: 24 IU/L (ref 0–44)
AST: 24 IU/L (ref 0–40)
Albumin: 4.6 g/dL (ref 3.8–4.9)
Alkaline Phosphatase: 85 IU/L (ref 47–123)
BUN/Creatinine Ratio: 15 (ref 9–20)
BUN: 16 mg/dL (ref 6–24)
Bilirubin Total: 0.4 mg/dL (ref 0.0–1.2)
CO2: 22 mmol/L (ref 20–29)
Calcium: 9.5 mg/dL (ref 8.7–10.2)
Chloride: 103 mmol/L (ref 96–106)
Creatinine, Ser: 1.06 mg/dL (ref 0.76–1.27)
Globulin, Total: 2.6 g/dL (ref 1.5–4.5)
Glucose: 91 mg/dL (ref 70–99)
Potassium: 4.8 mmol/L (ref 3.5–5.2)
Sodium: 140 mmol/L (ref 134–144)
Total Protein: 7.2 g/dL (ref 6.0–8.5)
eGFR: 82 mL/min/1.73

## 2024-05-03 ENCOUNTER — Ambulatory Visit: Payer: Self-pay | Admitting: Family Medicine

## 2024-05-20 ENCOUNTER — Encounter: Payer: Self-pay | Admitting: Family Medicine

## 2025-02-09 ENCOUNTER — Ambulatory Visit: Admitting: Urology
